# Patient Record
Sex: Female | Born: 1985 | Hispanic: Yes | Marital: Married | Smoking: Current some day smoker
Health system: Southern US, Community
[De-identification: ages and names within clinical notes are randomized; demographics above are authoritative.]

## PROBLEM LIST (undated history)

## (undated) DIAGNOSIS — G4730 Sleep apnea, unspecified: Secondary | ICD-10-CM

## (undated) DIAGNOSIS — J45909 Unspecified asthma, uncomplicated: Secondary | ICD-10-CM

## (undated) HISTORY — DX: Unspecified asthma, uncomplicated: J45.909

## (undated) HISTORY — PX: OTHER SURGICAL HISTORY: SHX169

---
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
## 2007-01-09 HISTORY — PX: CHOLECYSTECTOMY: SHX55

## 2008-07-14 ENCOUNTER — Emergency Department (HOSPITAL_COMMUNITY): Admission: EM | Admit: 2008-07-14 | Discharge: 2008-07-14 | Payer: Self-pay | Admitting: Emergency Medicine

## 2008-07-14 IMAGING — US US OB COMP LESS 14 WK
1 series · 14 of 23 positions shown · non-contrast
Comparison: None

CLINICAL DATA: Abdominal pain.  Vomiting.

OBSTETRIC <14 WK ULTRASOUND
TECHNIQUE: Transabdominal ultrasound was performed for evaluation
of the gestation as well as the maternal uterus and adnexal
regions.

[Series 1: us ob comp less 14 wk · 0.30mm/px · 14 of 23 slices shown]
[im 1/23]
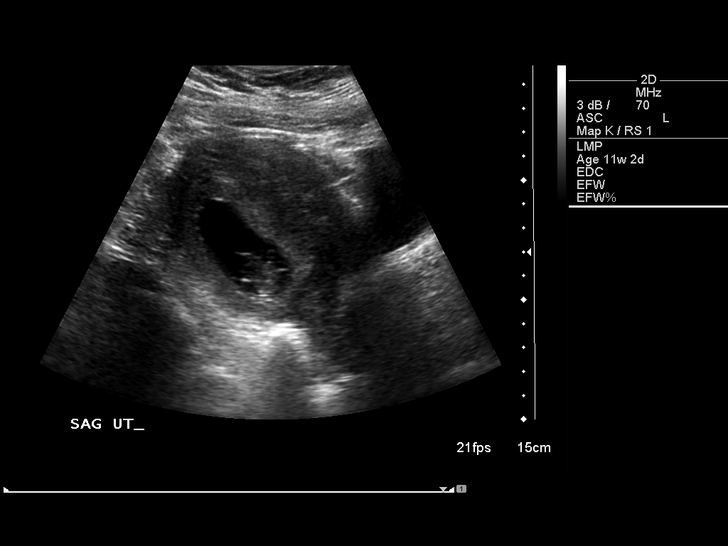
[im 3/23]
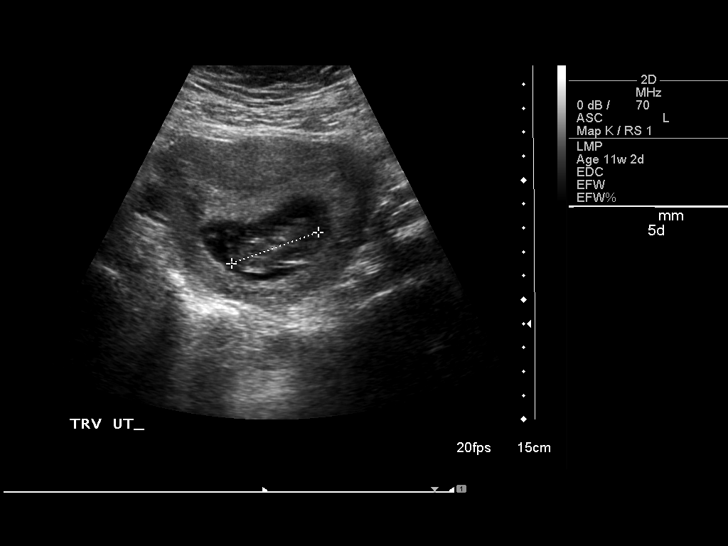
[im 5/23]
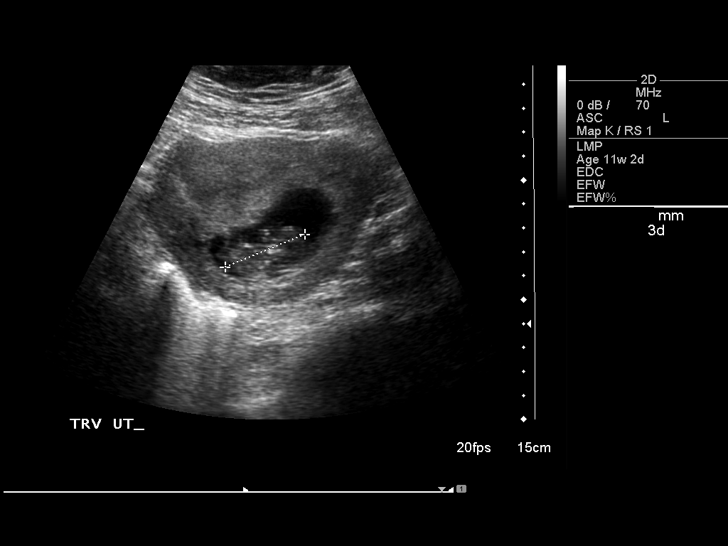
[im 6/23]
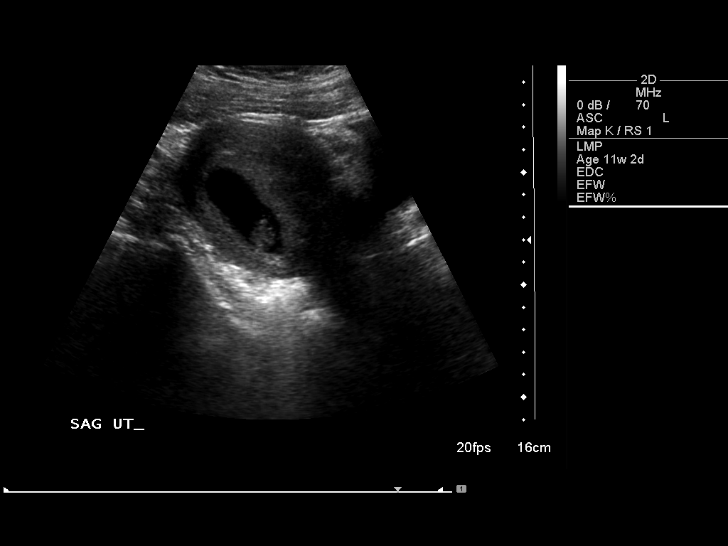
[im 8/23]
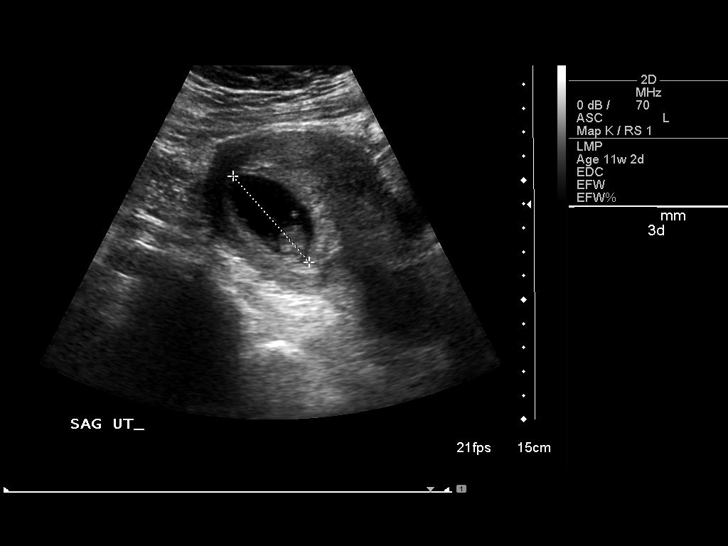
[im 10/23]
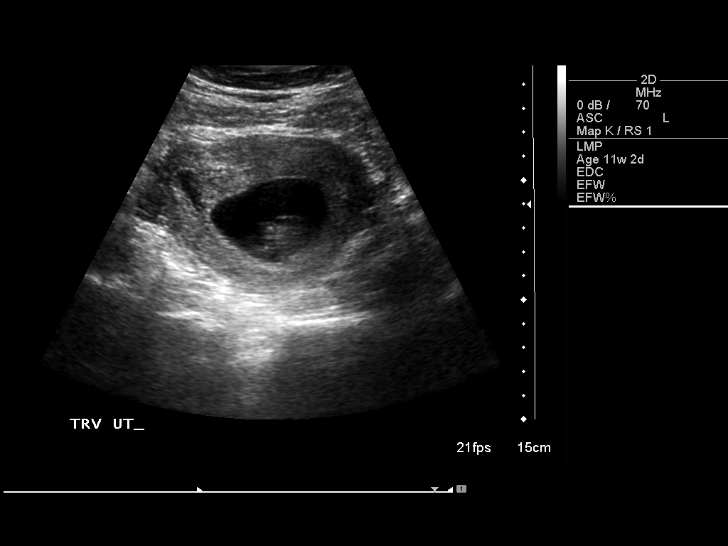
[im 11/23]
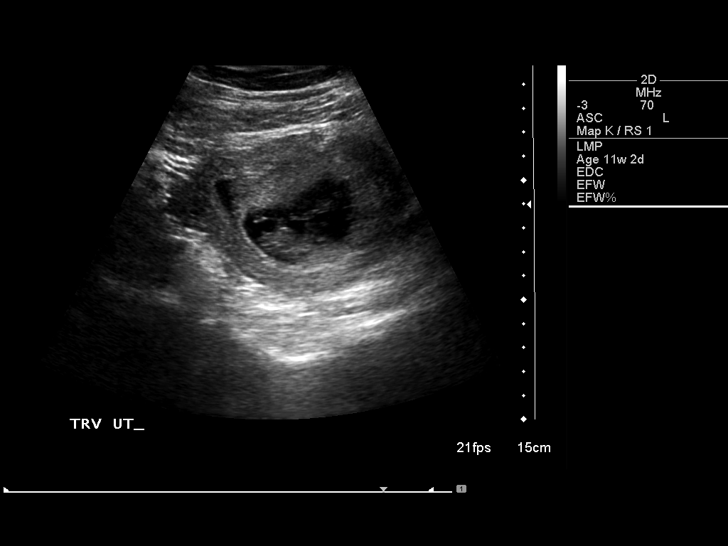
[im 13/23]
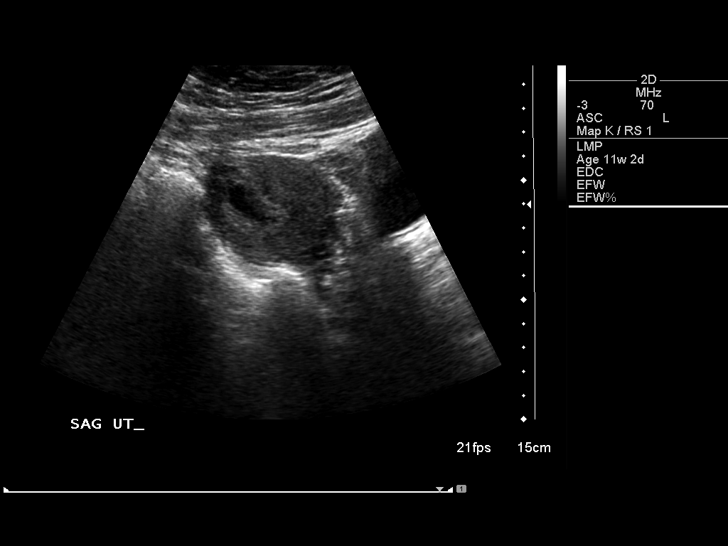
[im 14/23]
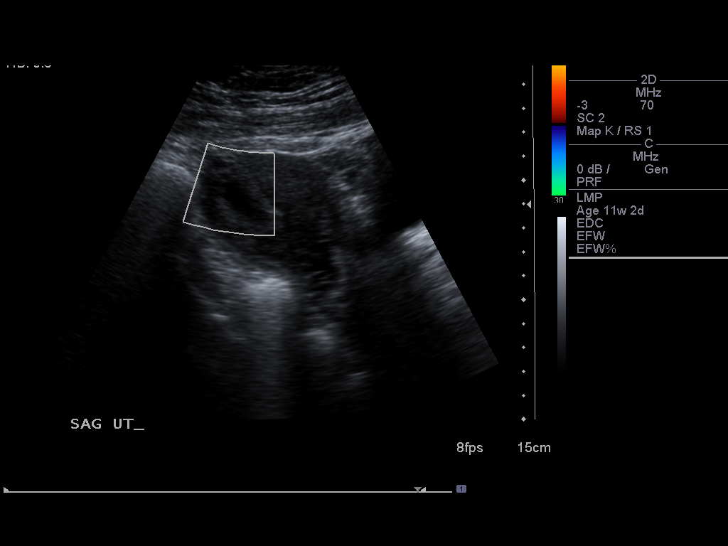
[im 16/23]
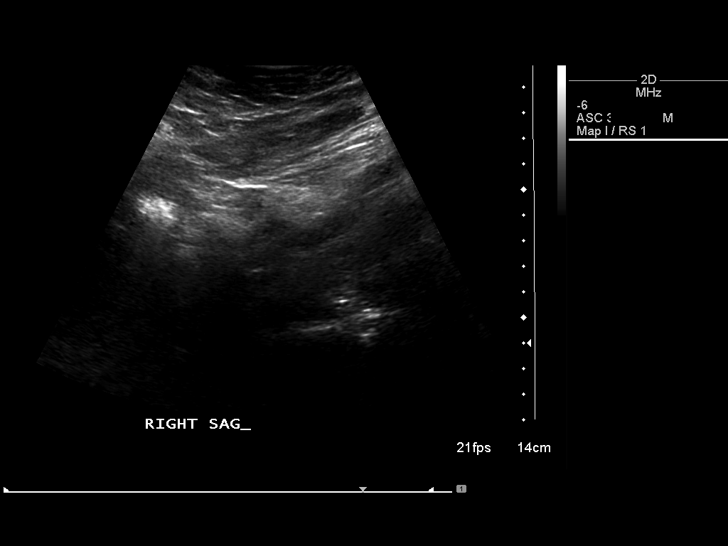
[im 18/23]
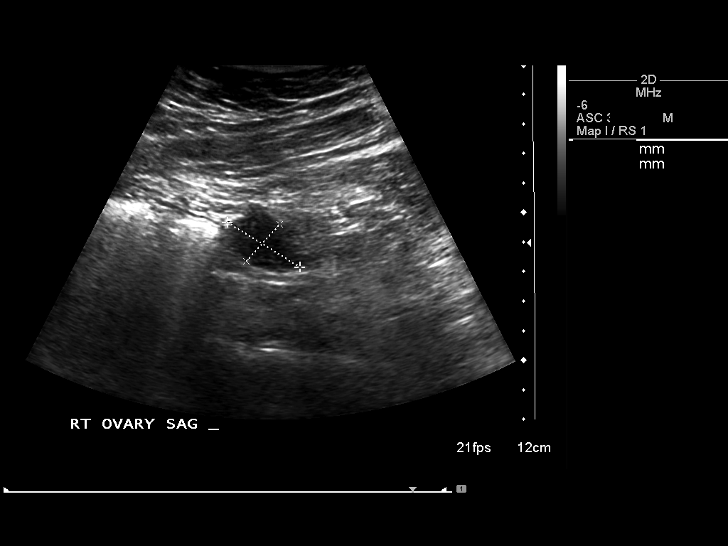
[im 19/23]
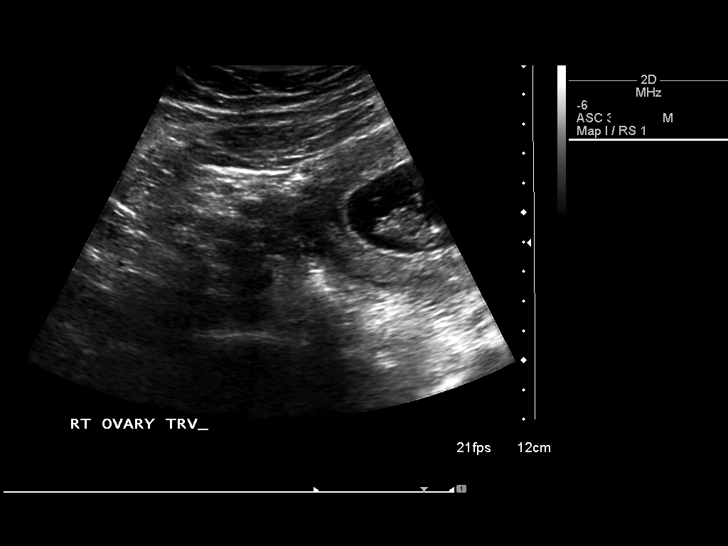
[im 21/23]
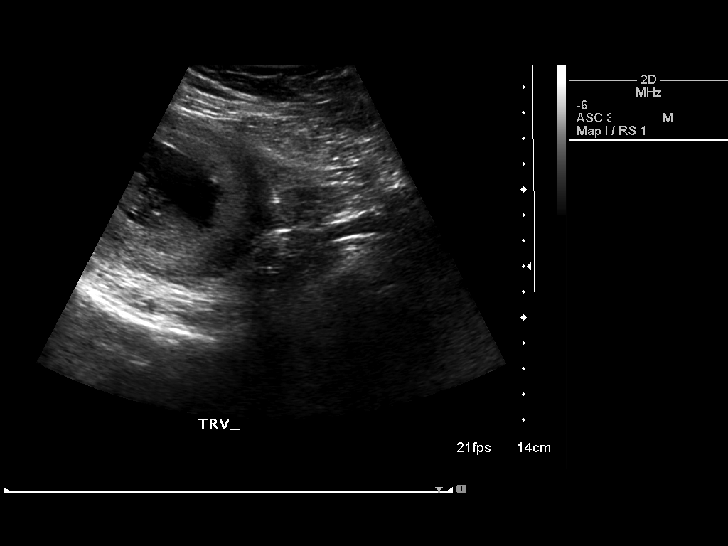
[im 23/23]
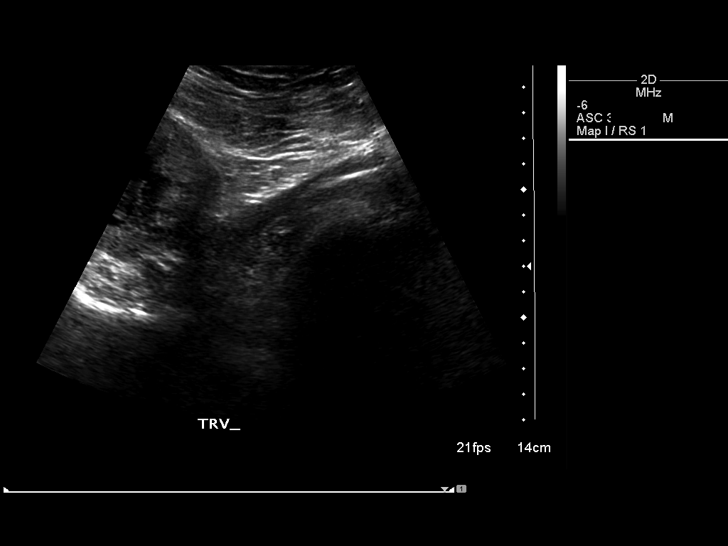

[14 of 23 positions shown; findings below may reference images not displayed]

Intrauterine gestational sac: Single
Yolk sac: None
Embryo: Present
Cardiac Activity: Present
Heart Rate: 163 bpm

MSD: 48.5 mm  10w  4d
CRL:  37.3         10w  4d          US EDC: [DATE]

Maternal uterus/Adnexae:
The right ovary has a normal appearance.  The left ovary is not
visualized. Within the right aspect of the uterus, there is a small
fluid collection which could represent a small subchorionic
hemorrhage.  Alternatively, this could represent a separate
gestational sac lacking an embryonic pole.  Follow-up ultrasound is
suggested.
IMPRESSION: Single living intrauterine embryo corresponding to an age of 10
weeks 4 days.  At the time of anatomy scan, follow-up can be
performed to document presence or absence of a separate fluid
collection within the uterus.

## 2009-01-30 ENCOUNTER — Emergency Department (HOSPITAL_COMMUNITY): Admission: EM | Admit: 2009-01-30 | Discharge: 2009-01-30 | Payer: Self-pay | Admitting: Emergency Medicine

## 2009-01-30 IMAGING — CR DG CHEST 2V
2 series · 2 of 2 positions shown · non-contrast
Comparison: None.

CLINICAL DATA: Cough and fever.

CHEST - 2 VIEW

[view not recorded (1 of 2)]
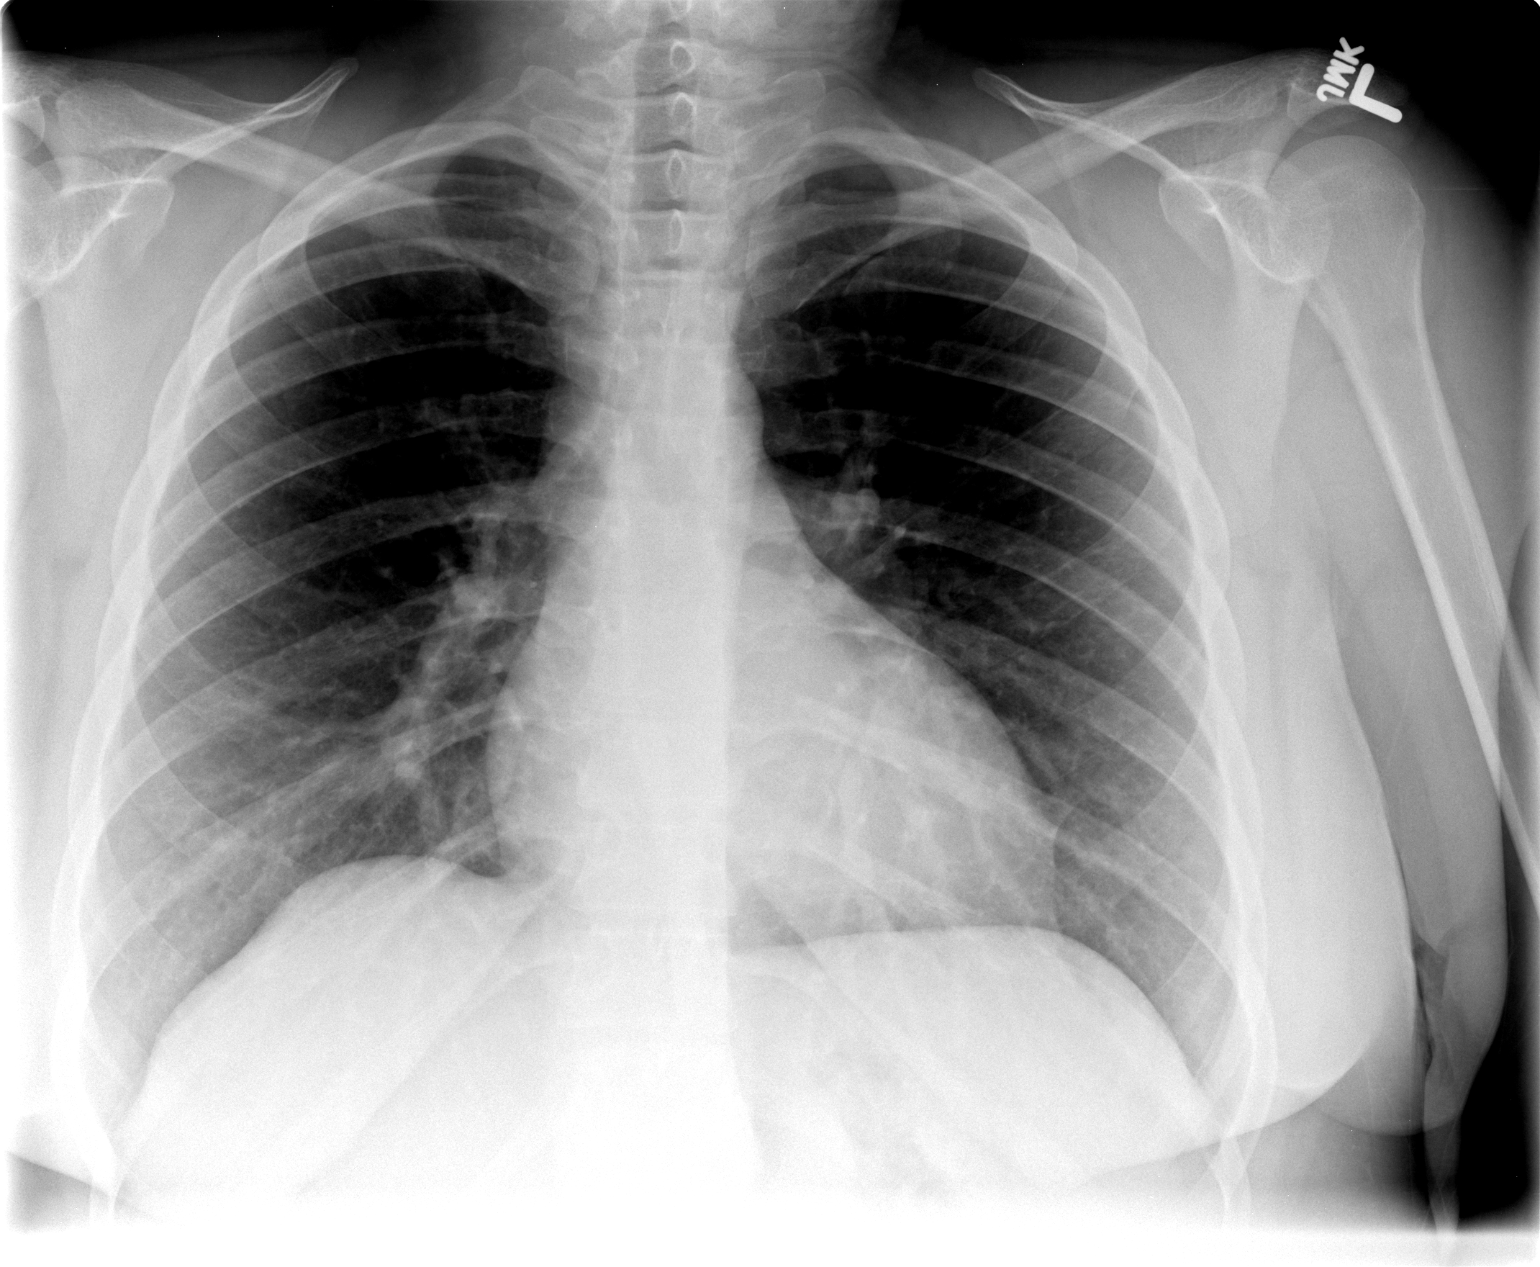

[view not recorded (2 of 2)]
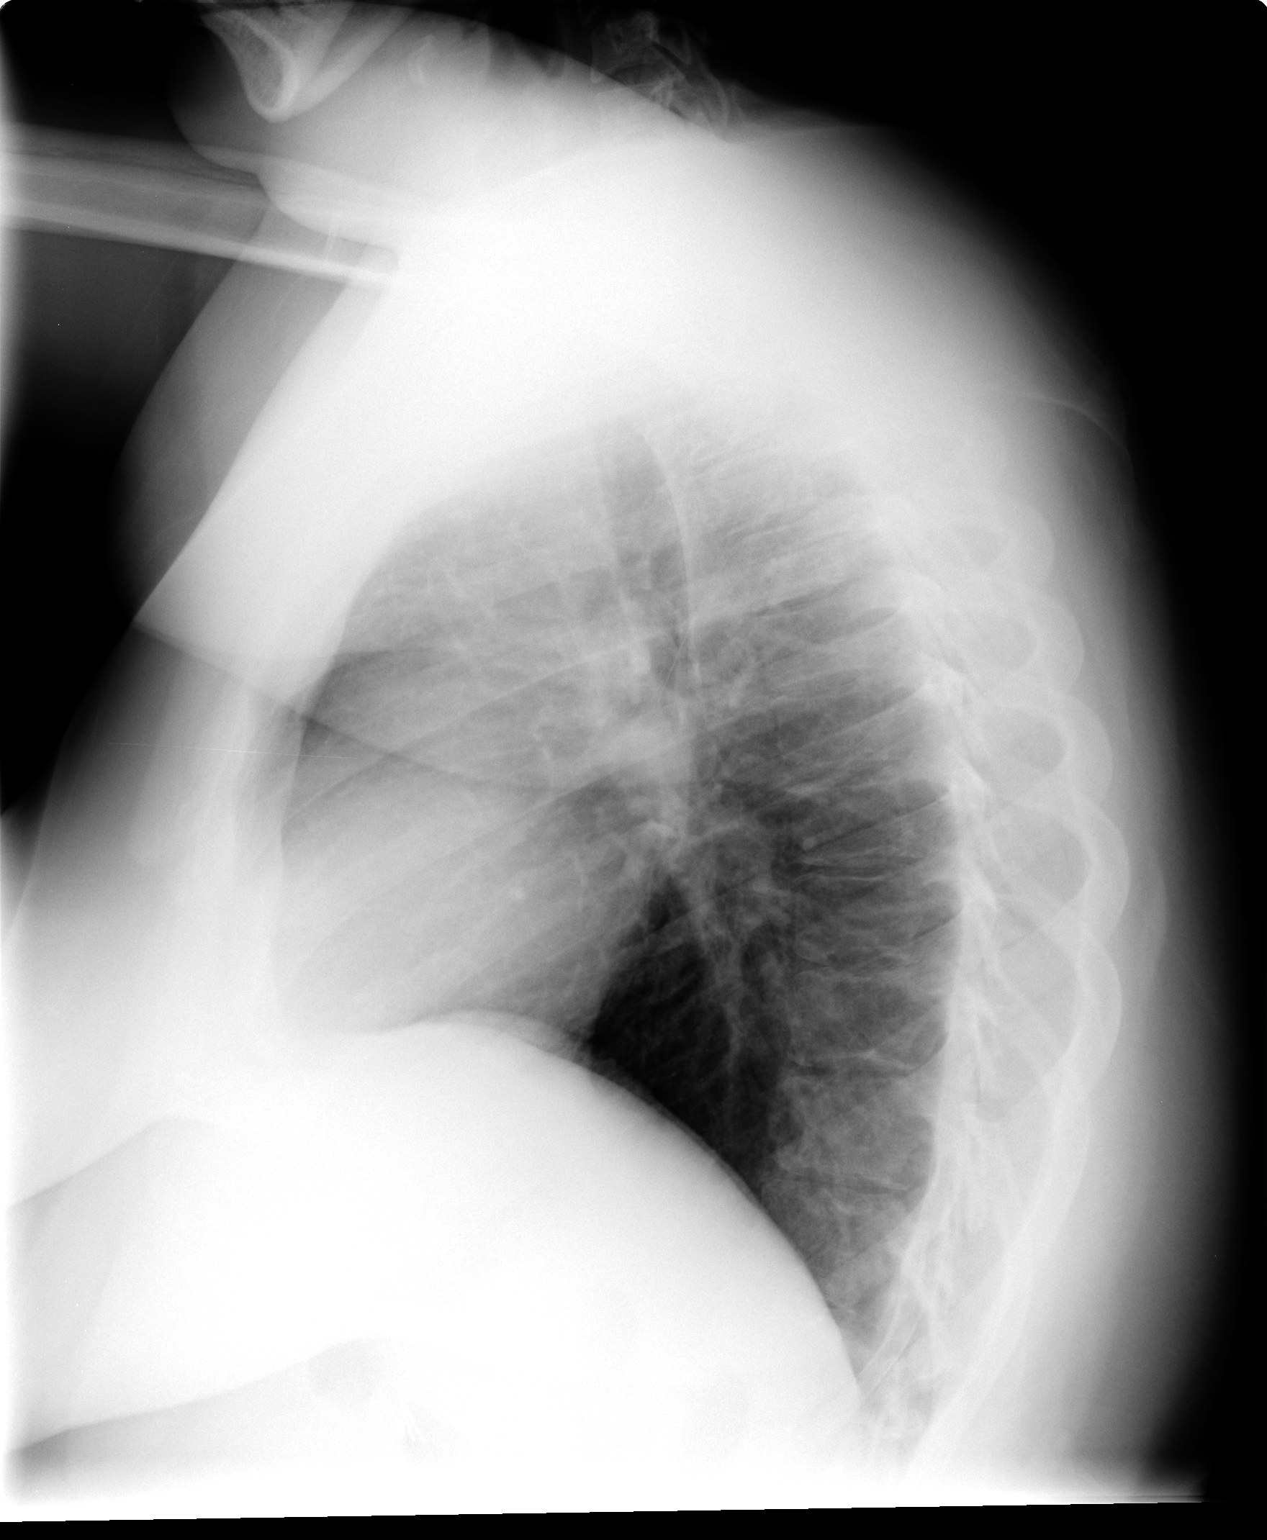

[2 of 2 positions shown; findings below may reference images not displayed]

FINDINGS: The heart size and mediastinal contours are within
normal limits.  Both lungs are clear.  The visualized skeletal
structures are unremarkable.
IMPRESSION: No active cardiopulmonary disease.

## 2010-04-16 LAB — DIFFERENTIAL
Basophils Absolute: 0 10*3/uL (ref 0.0–0.1)
Basophils Relative: 0 % (ref 0–1)
Eosinophils Absolute: 0 10*3/uL (ref 0.0–0.7)
Monocytes Relative: 3 % (ref 3–12)
Neutro Abs: 11 10*3/uL — ABNORMAL HIGH (ref 1.7–7.7)
Neutrophils Relative %: 77 % (ref 43–77)

## 2010-04-16 LAB — COMPREHENSIVE METABOLIC PANEL
ALT: 34 U/L (ref 0–35)
AST: 31 U/L (ref 0–37)
Alkaline Phosphatase: 51 U/L (ref 39–117)
CO2: 22 mEq/L (ref 19–32)
Chloride: 106 mEq/L (ref 96–112)
GFR calc non Af Amer: >60 mL/min (ref >60)
Glucose, Bld: 90 mg/dL (ref 70–99)
Sodium: 136 mEq/L (ref 135–145)
Total Bilirubin: 0.6 mg/dL (ref 0.3–1.2)

## 2010-04-16 LAB — URINALYSIS, ROUTINE W REFLEX MICROSCOPIC
Bilirubin Urine: NEGATIVE
Protein, ur: 100 mg/dL — AB
Specific Gravity, Urine: >1.03 — ABNORMAL HIGH (ref 1.005–1.030)
Urobilinogen, UA: 0.2 mg/dL (ref 0.0–1.0)

## 2010-04-16 LAB — CBC
Hemoglobin: 14.3 g/dL (ref 12.0–15.0)
MCHC: 35.9 g/dL (ref 30.0–36.0)
MCV: 94.7 fL (ref 78.0–100.0)
RBC: 4.21 MIL/uL (ref 3.87–5.11)
RDW: 12.3 % (ref 11.5–15.5)

## 2010-04-16 LAB — LIPASE, BLOOD: Lipase: 12 U/L (ref 11–59)

## 2010-04-16 LAB — URINE CULTURE

## 2010-04-16 LAB — PREGNANCY, URINE: Preg Test, Ur: POSITIVE

## 2010-04-16 LAB — URINE MICROSCOPIC-ADD ON
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
## 2010-05-26 NOTE — H&P (Signed)
[name], BRODER NO.:  [id number]   MEDICAL RECORD NO.:  [id number]                   PATIENT TYPE:  INP   LOCATION:  0102                                 [facility]ACILITY[facility]  [facility]   PHYSICIAN:  Cindie Crumbly, M.D.               DATE OF BIRTH:  11-11-1985   DATE OF ADMISSION:  03/18/2002  DATE OF DISCHARGE:                         PSYCHIATRIC ADMISSION ASSESSMENT   REASON FOR ADMISSION:  This 25 year old white female was admitted  complaining of depression with suicidal ideation with a plan to overdose.   HISTORY OF PRESENT ILLNESS:  The patient was discharged from the [facility] one day prior to this admission.  She went to the [facility] the day after discharge and admitted to suicidal  ideation with a plan.  She refused to contract for safety.  She was thus  readmitted to the hospital for stabilization.  She complains of continually  depressed, irritable and angry mood most of the day, nearly every day,  anhedonia, giving up on activities previously found pleasurable, decreased  school performance, decreased concentration and energy level, increased  symptoms of fatigue, psychomotor agitation, feelings of hopelessness,  helplessness, worthlessness, insomnia, weight gain, recurrent thoughts of  death.  She refuses to contract for safety at this time.   PAST PSYCHIATRIC HISTORY:  Oppositional defiant disorder and major  depression.  Please see the patient's previous psychiatric admission  assessment and discharge summary for the details.   ALCOHOL/DRUG HISTORY:  The patient smokes a pack of cigarettes per day.  She  denies any other use of street drugs or alcohol.   PAST MEDICAL HISTORY:  Obesity.   ALLERGIES:  She has no known drug allergies or sensitivities.   CURRENT MEDICATIONS:  Zoloft 100 mg p.o. q.d.   STRENGTHS/ASSETS:  Her mother is supportive of her.   FAMILY/SOCIAL HISTORY:  The patient lives with  her mother and father.  She  is currently in the ninth grade.  For family and social history, please see  the patient's previous admission.   MENTAL STATUS EXAM:  The patient presents as a well-developed, well-  nourished, adolescent white female who is alert, oriented x 4, psychomotor  agitated and whose appearance is compatible with her stated age.  Her speech  is coherent with a decreased rate and volume of speech, increased speech  latency.  She displays no looseness of associations, phonemic errors or  evidence of a thought disorder.  Her affect and mood are depressed,  irritable and angry.  She displays poor impulse control, decreased  concentration.  Her immediate recall, short-term memory and remote memory  are intact.  Similarities and differences are within normal limits.  She is  able to abstract to simple proverbs.   DIAGNOSES (ACCORDING TO DSM-IV):   AXIS I:  1. Major depression, recurrent, severe without psychosis.  2. Conduct disorder.  3. Rule out oppositional defiant disorder.  4. Nicotine dependence.  5.  Rule out polysubstance dependence.   AXIS II:  1. Rule out personality disorder not otherwise specified.  2. Rule out learning disorder not otherwise specified.   AXIS III:  Obesity.   AXIS IV:  Severe.   AXIS V:  20.   ESTIMATED LENGTH OF STAY:  Five to seven days.   INITIAL DISCHARGE PLAN:  Discharge the patient to home.   INITIAL PLAN OF CARE:  Continue the patient on Zoloft.  Psychotherapy will  focus on improving the patient's impulse control, decreasing cognitive  distortions and potential for self-harm.  A laboratory workup will also be  initiated to rule out any other medical problems contributing to her  symptomatology.                                               Cindie Crumbly, M.D.    TS/MEDQ  D:  03/19/2002  T:  03/19/2002  Job:  [id number]
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
## 2010-05-26 NOTE — Discharge Summary (Signed)
NAMEANOUSHKA, DIVITO NO.:  [id number]   MEDICAL RECORD NO.:  [id number]                   PATIENT TYPE:  INP   LOCATION:  0102                                 FACILITY:  [facility]   PHYSICIAN:  Cindie Crumbly, M.D.               DATE OF BIRTH:  17-Sep-1985   DATE OF ADMISSION:  03/18/2002  DATE OF DISCHARGE:  03/20/2002                                 DISCHARGE SUMMARY   REASON FOR ADMISSION:  This 25 year old white female was admitted  complaining of depression with suicidal ideation with a plan to overdose.  For further history of present illness, please see the patient's psychiatric  admission assessment.   PHYSICAL EXAMINATION:  Her physical examination at the time of admission was  significant for obesity.   LABORATORY DATA:  Her laboratory workup was completed during her last  hospitalization and no additional laboratory workup was obtained during this  hospitalization.  The patient received no x-rays, no special procedures, no  additional consultations.  She sustained no complications during the course  of this hospitalization.   HOSPITAL COURSE:  On admission, the patient was psychomotor agitated.  Affect and mood were depressed, irritable, angry.  She displayed poor  impulse control and decreased concentration.  She was restarted on Zoloft at  100 mg p.o. daily and has tolerated this medication well without side  effects.  Her affect and mood have improved.  At the time of discharge, she  is denying any homicidal or suicidal ideation over the last three days that  she has been hospitalized.  She is actively participating in all aspects of  the therapeutic treatment program, is motivated for outpatient therapy, and  consequently is felt to have reached her maximum benefits of hospitalization  and is ready for discharge to a less restrictive alternative setting.   CONDITION ON DISCHARGE:  Her condition on discharge is improved.   DIAGNOSES:   Diagnoses according to DSM-IV:   AXIS I:  1. Major depression, recurrent, severe without psychosis.  2. Conduct disorder.  3. Rule out oppositional defiant disorder.  4. Nicotine dependence.   AXIS II:  1. Rule out learning disorder, not otherwise specified.  2. Rule out personality disorder, not otherwise specified.   AXIS III:  Obesity.   AXIS IV:  Severe.   AXIS V:  20 on admission, 30 on discharge.   FURTHER EVALUATION AND TREATMENT RECOMMENDATIONS:  1. The patient is discharged to home.  2. She is discharged on an unrestricted level of activity and a regular     diet.  3. She will follow up with outpatient psychiatrist at [facility] for all further aspects of her psychiatric care and     consequently, I will sign off on the case at this time.  4. She is discharged on Zoloft 100 mg p.o. daily.  Cindie Crumbly, M.D.    TS/MEDQ  D:  03/20/2002  T:  03/20/2002  Job:  [id number]
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
## 2010-05-26 NOTE — Discharge Summary (Signed)
NAMEZACHARY, [name] NO.:  [id number]   MEDICAL RECORD NO.:  [id number]                   PATIENT TYPE:  INP   LOCATION:  0105                                 FACILITY:  [facility]   PHYSICIAN:  Cindie Crumbly, M.D.               DATE OF BIRTH:  12/28/1985   DATE OF ADMISSION:  03/11/2002  DATE OF DISCHARGE:  03/17/2002                                 DISCHARGE SUMMARY   REASON FOR ADMISSION:  This 25 year old white female was admitted  complaining of depression status post overdose with aspirin and Tylenol as a  suicide attempt.  For further history of present illness, please see the  patient's psychiatric admission assessment.   PHYSICAL EXAMINATION:  At the time of admission was significant for obesity,  old and new self-inflicted lacerations to the left forearm. She had an  otherwise unremarkable physical examination.   LABORATORY DATA:  Free T4 was 0.85, TSH was 0.500.  Urine probe for  gonorrhea and chlamydia are pending at the time of discharge and RPR was  nonreactive.  GGT was within normal limits.  All further laboratory  evaluations were performed at [facility] prior to the  patient being transferred to this facility.  The patient received no x-rays,  no special procedures, no additional consultations.  He sustained no  complications during the course of this hospitalization.   HOSPITAL COURSE:  On admission, the patient was psychomotor agitated.  Affect and mood were depressed, irritable, anxious and angry.  She displayed  poor impulse control, decreased concentration and poor hygiene.  She was  begun on a trial of Zoloft and titrated up to a therapeutic dose.  At the  time of discharge, she denies any suicidal or homicidal ideation.  Her  affect and mood have improved.  Her hygiene is improved.  She is performing  all of her activities of daily living.  She displayed no evidence of a  thought disorder throughout her  hospital course.  She is actively  participating in all aspects of the therapeutic treatment program, is  tolerating Zoloft without side effects.  She is motivated for outpatient  therapy and, consequently, is felt to have reached her maximum benefits of  hospitalization and is ready for discharge to a less restrictive alternative  setting.   CONDITION ON DISCHARGE:  Improved.   DIAGNOSES (ACCORDING TO DSM-IV):   AXIS I:  1. Major depression, recurrent, severe without psychosis.  2. Conduct disorder.  3. Nicotine dependence.  4. Alcohol abuse.  5. Rule out alcohol dependence.  6. Rule out polysubstance dependence.   AXIS II:  1. Rule out personality disorder not otherwise specified.  2. Rule out learning disorder not otherwise specified.   AXIS III:  Obesity.   AXIS IV:  Severe.   AXIS V:  20 on admission; 30 on discharge.   FURTHER EVALUATION AND TREATMENT RECOMMENDATIONS:  1. The patient is discharged to home.  2.  She is discharged on an unrestricted level of activity and a regular     diet.  3. She is discharged on Zoloft 100 mg p.o. q.d.  4. She will follow up with her outpatient psychiatrist for all further     aspects of her psychiatric care and with her primary care physician for     all further aspects of her medical care.                                               Cindie Crumbly, M.D.    TS/MEDQ  D:  03/17/2002  T:  03/17/2002  Job:  [id number]

## 2011-07-24 ENCOUNTER — Encounter (HOSPITAL_COMMUNITY): Payer: Self-pay

## 2011-07-24 ENCOUNTER — Emergency Department (HOSPITAL_COMMUNITY)
Admission: EM | Admit: 2011-07-24 | Discharge: 2011-07-24 | Disposition: A | Discharge status: Home / Self Care | Payer: Self-pay | Attending: Emergency Medicine | Admitting: Emergency Medicine

## 2011-07-24 DIAGNOSIS — Y9389 Activity, other specified: Secondary | ICD-10-CM | POA: Insufficient documentation

## 2011-07-24 DIAGNOSIS — F17200 Nicotine dependence, unspecified, uncomplicated: Secondary | ICD-10-CM | POA: Insufficient documentation

## 2011-07-24 DIAGNOSIS — Y998 Other external cause status: Secondary | ICD-10-CM | POA: Insufficient documentation

## 2011-07-24 DIAGNOSIS — W268XXA Contact with other sharp object(s), not elsewhere classified, initial encounter: Secondary | ICD-10-CM | POA: Insufficient documentation

## 2011-07-24 DIAGNOSIS — S61219A Laceration without foreign body of unspecified finger without damage to nail, initial encounter: Secondary | ICD-10-CM

## 2011-07-24 DIAGNOSIS — S61209A Unspecified open wound of unspecified finger without damage to nail, initial encounter: Secondary | ICD-10-CM | POA: Insufficient documentation

## 2011-07-24 MED ORDER — ACETAMINOPHEN 325 MG PO TABS
650.0000 mg | ORAL_TABLET | Freq: Once | ORAL | Status: AC
  Administered 2011-07-24: 650 mg via ORAL
  Filled 2011-07-24: qty 2

## 2011-07-24 MED ORDER — TETANUS-DIPHTH-ACELL PERTUSSIS 5-2.5-18.5 LF-MCG/0.5 IM SUSP
0.5000 mL | Freq: Once | INTRAMUSCULAR | Status: AC
  Administered 2011-07-24: 0.5 mL via INTRAMUSCULAR
  Filled 2011-07-24: qty 0.5

## 2011-07-24 MED ORDER — IBUPROFEN 800 MG PO TABS
800.0000 mg | ORAL_TABLET | Freq: Once | ORAL | Status: AC
  Administered 2011-07-24: 800 mg via ORAL
  Filled 2011-07-24: qty 1

## 2011-07-24 NOTE — ED Notes (Signed)
Pt cut left thumb while trying to open a can.  Bleeding controlled.  Unknown when last tetanus shot was.

## 2011-07-24 NOTE — ED Provider Notes (Signed)
Medical screening examination/treatment/procedure(s) were performed by non-physician practitioner and as supervising physician I was immediately available for consultation/collaboration.   Shaquita Fort L Rowyn Mustapha, MD 07/24/11 1515 

## 2011-07-24 NOTE — ED Provider Notes (Addendum)
History     CSN: [id number]  Arrival date & time 07/24/11  7425   First MD Initiated Contact with Patient 07/24/11 1114      Chief Complaint  Patient presents with  . Laceration    (Consider location/radiation/quality/duration/timing/severity/associated sxs/prior treatment) HPI Comments: Patient is a 26 year old female who cut her left thumb while opening a can today. The patient was able to control the bleeding with applying pressure. She states that she did not sustain injury to any other areas. She is unsure of the date of her last tetanus. She denies use of any blood thinning type medications and denies any bleeding disorders.  Patient is a 26 y.o. female presenting with skin laceration. The history is provided by the patient.  Laceration     History reviewed. No pertinent past medical history.  Past Surgical History  Procedure Date  . Colon surgery   . Left arm surgery     No family history on file.  History  Substance Use Topics  . Smoking status: Current Everyday Smoker  . Smokeless tobacco: Not on file  . Alcohol Use: No    OB History    Grav Para Term Preterm Abortions TAB SAB Ect Mult Living                  Review of Systems  Constitutional: Negative for activity change.       All ROS Neg except as noted in HPI  HENT: Negative for nosebleeds and neck pain.   Eyes: Negative for photophobia and discharge.  Respiratory: Negative for cough, shortness of breath and wheezing.   Cardiovascular: Negative for chest pain and palpitations.  Gastrointestinal: Negative for abdominal pain and blood in stool.  Genitourinary: Negative for dysuria, frequency and hematuria.  Musculoskeletal: Negative for back pain and arthralgias.  Skin: Negative.   Neurological: Negative for dizziness, seizures and speech difficulty.  Psychiatric/Behavioral: Negative for hallucinations and confusion.    Allergies  Aspirin  Home Medications  No current outpatient prescriptions  on file.  BP 140/91  Pulse 91  Temp 98.7 F (37.1 C) (Oral)  Resp 20  Ht 5\' 6"  (1.676 m)  Wt 225 lb (102.059 kg)  BMI 36.32 kg/m2  SpO2 99%  LMP 07/14/2011  Physical Exam  Nursing note and vitals reviewed. Constitutional: She is oriented to person, place, and time. She appears well-developed and well-nourished.  Non-toxic appearance.  HENT:  Head: Normocephalic.  Right Ear: Tympanic membrane and external ear normal.  Left Ear: Tympanic membrane and external ear normal.  Eyes: EOM and lids are normal. Pupils are equal, round, and reactive to light.  Neck: Normal range of motion. Neck supple. Carotid bruit is not present.  Cardiovascular: Normal rate, regular rhythm, normal heart sounds, intact distal pulses and normal pulses.   Pulmonary/Chest: Breath sounds normal. No respiratory distress.  Abdominal: Soft. Bowel sounds are normal. There is no tenderness. There is no guarding.  Musculoskeletal: Normal range of motion.       Laceration to the palmar surface of the left thumb. No bone or tendon involvement.  Lymphadenopathy:       Head (right side): No submandibular adenopathy present.       Head (left side): No submandibular adenopathy present.    She has no cervical adenopathy.  Neurological: She is alert and oriented to person, place, and time. She has normal strength. No cranial nerve deficit or sensory deficit.  Skin: Skin is warm and dry.  Psychiatric: She has a normal  mood and affect. Her speech is normal.    ED Course  Procedures (including critical care time)LACERATION REPAIR - the patient was identified with arm band. Permission for procedure given by the patient. Procedural time out taken before repair of the laceration to the left thumb. The thumb injury was irrigated with tap water. It was cleansed with safe cleanse. The wound was inspected. There was no foreign body appreciated, no bone or tendon involvement. The wound edges were brought together with Steri-Strips.  And the wound was repaired with Dermabond. The wound measures 2.6cm.  The patient tolerated the procedure without problem or complication. The patient's tetanus status was updated.  Labs Reviewed - No data to display No results found.   1. Laceration of finger, left       MDM  I have reviewed nursing notes, vital signs, and all appropriate lab and imaging results for this patient. Patient sustained a laceration to the left thumb while opening a can. This wound was repaired with Dermabond. Patient advised to return if any changes or signs of infection.       Kathie Dike, PA 07/24/11 1233  Kathie Dike, [place] 08/25/11 [phone]

## 2011-08-25 NOTE — ED Provider Notes (Signed)
Medical screening examination/treatment/procedure(s) were performed by non-physician practitioner and as supervising physician I was immediately available for consultation/collaboration.   Benny Lennert, MD 08/25/11 1504

## 2012-08-19 ENCOUNTER — Ambulatory Visit (INDEPENDENT_AMBULATORY_CARE_PROVIDER_SITE_OTHER): Payer: Medicaid Other | Admitting: Family Medicine

## 2012-08-19 ENCOUNTER — Encounter: Payer: Self-pay | Admitting: Family Medicine

## 2012-08-19 VITALS — BP 131/77 | HR 67 | Temp 99.3°F | Wt 256.2 lb

## 2012-08-19 DIAGNOSIS — G4710 Hypersomnia, unspecified: Secondary | ICD-10-CM

## 2012-08-19 DIAGNOSIS — R0989 Other specified symptoms and signs involving the circulatory and respiratory systems: Secondary | ICD-10-CM

## 2012-08-19 DIAGNOSIS — R0609 Other forms of dyspnea: Secondary | ICD-10-CM

## 2012-08-19 DIAGNOSIS — I809 Phlebitis and thrombophlebitis of unspecified site: Secondary | ICD-10-CM

## 2012-08-19 DIAGNOSIS — R0683 Snoring: Secondary | ICD-10-CM

## 2012-08-19 DIAGNOSIS — R635 Abnormal weight gain: Secondary | ICD-10-CM

## 2012-08-19 LAB — POCT CBC
Granulocyte percent: 64.7 %G (ref 37–80)
HCT, POC: 42.4 % (ref 37.7–47.9)
Hemoglobin: 14.3 g/dL (ref 12.2–16.2)
Lymph, poc: 2.9 (ref 0.6–3.4)
MCH, POC: 31.3 pg — AB (ref 27–31.2)
MCHC: 33.8 g/dL (ref 31.8–35.4)
MCV: 92.6 fL (ref 80–97)
MPV: 8.6 fL (ref 0–99.8)
POC Granulocyte: 5.7 (ref 2–6.9)
POC LYMPH PERCENT: 33.3 %L (ref 10–50)
Platelet Count, POC: 283 10*3/uL (ref 142–424)
RBC: 4.6 M/uL (ref 4.04–5.48)
RDW, POC: 14 %
WBC: 8.8 10*3/uL (ref 4.6–10.2)

## 2012-08-19 MED ORDER — NAPROXEN 500 MG PO TABS: 500.0000 mg | ORAL_TABLET | Freq: Two times a day (BID) | ORAL | Status: DC

## 2012-08-19 NOTE — Patient Instructions (Addendum)
Phlebitis  Phlebitis is a redness, tenderness and soreness (inflammation) in a vein. This can occur in your arms, legs, or torso (trunk), as well as deeper inside your body.   CAUSES   Phlebitis can be triggered by multiple factors. These include:   Reduced (restricted) blood flow through your veins. This happens with prolonged bed rest, long distance travel, injury or surgery. Being overweight (obese) and pregnant can also restrict blood flow and lead to phlebitis.   Putting a catheter in the vein (intravenous or IV) and giving certain medications through in the vein (intravenously).   Cancer and cancer treatment.   Use of illegal intravenous drugs.   Inflammatory diseases.   Inherited (genetic) diseases that increase the risk for blood clots.   Hormone therapy (such as birth control pills).  SYMPTOMS    Red, tender, swollen, painful area on your skin.   Usually, the area will be long and narrow.   Low grade fever.   Significant firmness along the center of this area. This can indicate that a blood clot has formed.   Surrounding redness or a high fever, which can indicate an infection (cellulitis).  DIAGNOSIS    The appearance of your condition and your symptoms will cause your caregiver to suspect phlebitis. Usually, this is enough for a diagnosis.   Your caregiver may request blood tests or an ultrasound test of the area to be sure you do not have an infection or a blood clot. Blood tests and discussing your family history may also indicate if you have an underlying genetic disease that causes blood clots.   Occasionally, a piece of tissue is taken from the body (biopsy) if an unusual cause of phlebitis is suspected.  TREATMENT    Raise (elevate) the affected area above the level of the heart.   Apply a warm compress or heating pad for 20 minutes, 3 or 4 times a day. If you use an electric heating pad, follow the directions so you do not burn yourself.   Anti-inflammatory medications are usually  recommended. Follow your caregiver's directions.   Any IV catheter, if present, will be removed by your caregiver.   Your caregiver may prescribe medicines that kill germs (antibiotics) if an infection is present.   Your caregiver may recommend blood thinners if a blood clot is suspected or present.   Support stockings or bandages may be helpful, depending on the cause and location of the phlebitis.   Surgery may be needed to remove very damaged sections of vein, but this is rare.  HOME CARE INSTRUCTIONS    Take medications exactly as prescribed.   Follow up with your caregiver as directed.   Use support stockings or bandages if advised. These will speed healing and prevent recurrence.   If you are on blood thinners:   Do follow-up blood tests exactly as directed.   Check with your caregiver before using any new medications.   Wear a pendant to show that you are on blood thinners.   For phlebitis in the legs:   Avoid prolonged standing or bed rest.   Keep your legs moving. Raise your legs with sitting or lying.   Do not smoke.   Women, particularly those over the age of 35, should consider the risks and benefits of taking the contraceptive pill. This kind of hormone treatment can increase your risk for blood clots.  SEEK MEDICAL CARE IF:    You have unusual bruising or any bleeding problems.   Swelling   or pain in your affected arm or leg is not gradually improving.   You are on anti-inflammatory medication and you develop belly (abdominal) pain.  SEEK IMMEDIATE MEDICAL CARE IF:    An unexplained oral temperature above 100.5 F (38.1 C) develops.   You have sudden onset of chest pain or difficulty breathing.  Document Released: 12/19/2000 Document Revised: 03/19/2011 Document Reviewed: 09/20/2008  [employer] Patient Information 2014 [employer]

## 2012-08-19 NOTE — Progress Notes (Signed)
  Subjective:    Patient ID: [name], female    DOB: 1985/04/29, 27 y.o.   MRN: [id number]  HPI This 27 y.o. female presents for evaluation of  Shortness of breath for 3 weeks. She has been having some shortness of breath and coughing at night.  She Has been having some wheezing.  She denies URI sx's.  She has bruising on her Left leg.  She also c/o severe snoring and insomnia problems.  She gets a headache Daily and she is chronically tired.   She admits to hypersomnolence.   Review of Systems C/o bruising and discomfort in her right leg, snoring, hypersomnolence, shortness of breath, night time coughing,  No chest pain,  dizziness, vision change, N/V, diarrhea, constipation, dysuria, urinary urgency or frequency, myalgias, arthralgias or rash.     Objective:   Physical Exam Vital signs noted  Well developed well nourished female.  HEENT - Head atraumatic Normocephalic                Eyes - PERRLA, Conjuctiva - clear Sclera- Clear EOMI                Ears - EAC's Wnl TM's Wnl Gross Hearing WNL                Nose - Nares patent                 Throat - oropharanx wnl Respiratory - Lungs CTA bilateral Cardiac - RRR S1 and S2 without murmur GI - Abdomen soft Nontender and bowel sounds active x 4 Extremities - No edema. Neuro - Grossly intact. Skin - Tender varicose vein right leg.  Negative Homan's      Assessment & Plan:  Snoring - Plan: Split night study  Hypersomnolence - Plan: Split night study, POCT CBC, CMP14+EGFR, TSH, Lipid panel  Morbid obesity - Discussed patient needing to use aggressive weight loss measures.  Thrombophlebitis - Plan: naproxen (NAPROSYN) 500 MG   Shortness of breath - Symbicort 160/4.5 2 puffs bid.  Follow up in 2 weeks.

## 2012-08-20 LAB — CMP14+EGFR
ALT: 39 IU/L — ABNORMAL HIGH (ref 0–32)
AST: 25 IU/L (ref 0–40)
Albumin/Globulin Ratio: 2 (ref 1.1–2.5)
Albumin: 4.8 g/dL (ref 3.5–5.5)
Alkaline Phosphatase: 71 IU/L (ref 39–117)
BUN/Creatinine Ratio: 16 (ref 8–20)
BUN: 11 mg/dL (ref 6–20)
CO2: 20 mmol/L (ref 18–29)
Calcium: 9.9 mg/dL (ref 8.7–10.2)
Chloride: 102 mmol/L (ref 97–108)
Creatinine, Ser: 0.69 mg/dL (ref 0.57–1.00)
GFR calc Af Amer: 140 mL/min/{1.73_m2} (ref >59)
GFR calc non Af Amer: 121 mL/min/{1.73_m2} (ref >59)
Globulin, Total: 2.4 g/dL (ref 1.5–4.5)
Glucose: 97 mg/dL (ref 65–99)
Potassium: 4.2 mmol/L (ref 3.5–5.2)
Sodium: 139 mmol/L (ref 134–144)
Total Bilirubin: 0.3 mg/dL (ref 0.0–1.2)
Total Protein: 7.2 g/dL (ref 6.0–8.5)

## 2012-08-20 LAB — LIPID PANEL
Chol/HDL Ratio: 5.4 ratio units — ABNORMAL HIGH (ref 0.0–4.4)
Cholesterol, Total: 225 mg/dL — ABNORMAL HIGH (ref 100–199)
HDL: 42 mg/dL (ref >39)
LDL Calculated: 136 mg/dL — ABNORMAL HIGH (ref 0–99)
Triglycerides: 234 mg/dL — ABNORMAL HIGH (ref 0–149)
VLDL Cholesterol Cal: 47 mg/dL — ABNORMAL HIGH (ref 5–40)

## 2012-08-20 LAB — TSH: TSH: 0.886 u[IU]/mL (ref 0.450–4.500)

## 2012-09-02 ENCOUNTER — Ambulatory Visit: Payer: Medicaid Other | Admitting: Family Medicine

## 2012-09-07 ENCOUNTER — Ambulatory Visit: Payer: Medicaid Other | Attending: Family Medicine | Admitting: Sleep Medicine

## 2012-09-07 DIAGNOSIS — R0683 Snoring: Secondary | ICD-10-CM

## 2012-09-07 DIAGNOSIS — G4733 Obstructive sleep apnea (adult) (pediatric): Secondary | ICD-10-CM | POA: Insufficient documentation

## 2012-09-07 DIAGNOSIS — G4710 Hypersomnia, unspecified: Secondary | ICD-10-CM

## 2012-09-10 ENCOUNTER — Ambulatory Visit (INDEPENDENT_AMBULATORY_CARE_PROVIDER_SITE_OTHER): Payer: Medicaid Other | Admitting: Family Medicine

## 2012-09-12 ENCOUNTER — Ambulatory Visit (INDEPENDENT_AMBULATORY_CARE_PROVIDER_SITE_OTHER): Payer: Medicaid Other | Admitting: Family Medicine

## 2012-09-12 ENCOUNTER — Encounter: Payer: Self-pay | Admitting: Family Medicine

## 2012-09-12 VITALS — BP 133/82 | HR 67 | Temp 99.7°F | Ht 66.0 in | Wt 253.0 lb

## 2012-09-12 DIAGNOSIS — J45909 Unspecified asthma, uncomplicated: Secondary | ICD-10-CM

## 2012-09-12 MED ORDER — BUDESONIDE-FORMOTEROL FUMARATE 80-4.5 MCG/ACT IN AERO: 2.0000 | INHALATION_SPRAY | Freq: Two times a day (BID) | RESPIRATORY_TRACT | Status: DC

## 2012-09-12 MED ORDER — ALBUTEROL SULFATE HFA 108 (90 BASE) MCG/ACT IN AERS: 2.0000 | INHALATION_SPRAY | Freq: Four times a day (QID) | RESPIRATORY_TRACT | Status: DC | PRN

## 2012-09-12 NOTE — Progress Notes (Signed)
  Subjective:    Patient ID: [name], female    DOB: 1985/12/28, 27 y.o.   MRN: [id number]  HPI This 27 y.o. female presents for evaluation of shortness of breath.  She has responded Well to the symbicort and is not coughing anymore or having night time wheezing.  She has done A sleep study and she states she was told she has apnea and would be scheduled another test. Her sleep study is still pending. She has had labs and her trigs were elevated due non fasting state And her LDL was mildly elevated.  She is otherwise feeling fine.   Review of Systems No chest pain, SOB, HA, dizziness, vision change, N/V, diarrhea, constipation, dysuria, urinary urgency or frequency, myalgias, arthralgias or rash.     Objective:   Physical Exam Vital signs noted  Well developed well nourished female.  HEENT - Head atraumatic Normocephalic                Eyes - PERRLA, Conjuctiva - clear Sclera- Clear EOMI                Ears - EAC's Wnl TM's Wnl Gross Hearing WNL                Nose - Nares patent                 Throat - oropharanx wnl Respiratory - Lungs CTA bilateral Cardiac - RRR S1 and S2 without murmur GI - Abdomen soft Nontender and bowel sounds active x 4 Extremities - No edema. Neuro - Grossly intact.       Assessment & Plan:  Unspecified asthma(493.90) - Plan: budesonide-formoterol (SYMBICORT) 80-4.5 MCG/ACT inhaler, albuterol (PROVENTIL HFA;VENTOLIN HFA) 108 (90 BASE) MCG/ACT inhaler  Snoring - Await sleep study results.  Follow up in 3 months.

## 2012-09-12 NOTE — Patient Instructions (Signed)

## 2012-09-13 NOTE — Procedures (Signed)
[name], [name]       ACCOUNT NO.:  [id number]  MEDICAL RECORD NO.:  [id number]          PATIENT TYPE:  OUT  LOCATION:  SLEEP LAB                     FACILITY:  [facility]  PHYSICIAN:  Valentine Barney A. Gerilyn Pilgrim, M.D. DATE OF BIRTH:  May 29, 1985  DATE OF STUDY:  09/07/2012                           NOCTURNAL POLYSOMNOGRAM   REFERRING PHYSICIAN:  Nils Pyle, FNP  INDICATION:  A 27 year old, who presents with obesity, hypersomnia, and snoring.   MEDICATIONS:  Naproxen.  EPWORTH SLEEPINESS SCALE:  14.  BMI:  41.  ARCHITECTURAL SUMMARY:  The total recording time is 401 minutes.  Sleep efficiency 92%.  Sleep latency 16 minutes.  REM latency 81 minutes. Stage N1 5%, N2 46%, N3 30%, and REM sleep 18%.  RESPIRATORY SUMMARY:  Baseline oxygen saturation is 97, lowest saturation 89 during non-REM sleep.  Diagnostic AHI 16 and RDI 21.  LIMB MOVEMENT SUMMARY:  PLM index 0.  ELECTROCARDIOGRAM SUMMARY:  Average heart rate is 56 with no significant dysrhythmias observed.  IMPRESSION:  Moderate obstructive sleep apnea syndrome.  RECOMMENDATION:  Formal CPAP titration.  Thanks for this referral.     Lovell Roe A. Gerilyn Pilgrim, M.D.    KAD/MEDQ  D:  09/13/2012 16:15:31  T:  09/13/2012 16:25:18  Job:  [id number]

## 2012-09-18 ENCOUNTER — Other Ambulatory Visit: Payer: Self-pay | Admitting: Family Medicine

## 2012-09-18 DIAGNOSIS — G4733 Obstructive sleep apnea (adult) (pediatric): Secondary | ICD-10-CM

## 2012-09-29 ENCOUNTER — Other Ambulatory Visit: Payer: Self-pay | Admitting: Family Medicine

## 2012-09-29 DIAGNOSIS — G4730 Sleep apnea, unspecified: Secondary | ICD-10-CM

## 2012-11-13 ENCOUNTER — Other Ambulatory Visit: Payer: Self-pay

## 2012-11-14 ENCOUNTER — Ambulatory Visit: Payer: Medicaid Other | Attending: Family Medicine | Admitting: Sleep Medicine

## 2012-11-14 VITALS — Ht 66.0 in | Wt 250.0 lb

## 2012-11-14 DIAGNOSIS — G4733 Obstructive sleep apnea (adult) (pediatric): Secondary | ICD-10-CM

## 2012-11-22 NOTE — Procedures (Signed)
[facility] Rozann Holts A. Gerilyn Pilgrim, MD     [email or web address]         NAMEGRACELYNNE, BENEDICT       ACCOUNT NO.:  [id number]  MEDICAL RECORD NO.:  [id number]          PATIENT TYPE:  OUT  LOCATION:  SLEEP LAB                     FACILITY:  [facility]  PHYSICIAN:  Brigida Scotti A. Gerilyn Pilgrim, M.D. DATE OF BIRTH:  [birth year]  DATE OF STUDY:  11/14/2012                           NOCTURNAL POLYSOMNOGRAM  REFERRING PHYSICIAN:  Anselm Pancoast OXFORD  INDICATION:  A 27 year old who had a previous study documented obstructive sleep apnea syndrome.  This is a CPAP titration recording.  MEDICATIONS:  Symbicort.  EPWORTH SLEEPINESS SCALE:  14.  BMI:  41.  ARCHITECTURAL SUMMARY:  The total recording time is 388 minutes.  Sleep efficiency 85%.  Sleep latency 19 minutes.  REM latency is 64 minutes. Stage N1 4%, N2 45%, N3 22% and REM sleep 29%.  RESPIRATORY SUMMARY:  Baseline oxygen saturation is 98, lowest saturation 93.  The patient was placed on positive pressure starting at 5 and increased to 9.  Optimal pressure is 9 with resolution of events and good tolerance.  LIMB MOVEMENT SUMMARY:  PLM index 0.  ELECTROCARDIOGRAM SUMMARY:  Average heart rate is 68 with no significant dysrhythmias observed.  IMPRESSION:  Obstructive sleep apnea syndrome, which responds well to a CPAP of 9.  Thanks for this referral.     Leanny Moeckel A. Gerilyn Pilgrim, M.D.    KAD/MEDQ  D:  11/22/2012 17:56:09  T:  11/22/2012 18:17:38  Job:  [id number]

## 2012-11-26 ENCOUNTER — Other Ambulatory Visit: Payer: Self-pay | Admitting: Family Medicine

## 2012-11-26 DIAGNOSIS — G4733 Obstructive sleep apnea (adult) (pediatric): Secondary | ICD-10-CM

## 2012-12-08 ENCOUNTER — Other Ambulatory Visit: Payer: Self-pay | Admitting: Family Medicine

## 2012-12-12 ENCOUNTER — Ambulatory Visit: Payer: Medicaid Other | Admitting: Family Medicine

## 2013-02-20 ENCOUNTER — Encounter: Payer: Self-pay | Admitting: Family Medicine

## 2013-02-20 ENCOUNTER — Ambulatory Visit (INDEPENDENT_AMBULATORY_CARE_PROVIDER_SITE_OTHER): Payer: Medicaid Other | Admitting: Family Medicine

## 2013-02-20 VITALS — BP 130/70 | HR 93 | Temp 99.2°F | Ht 66.0 in | Wt 265.0 lb

## 2013-02-20 DIAGNOSIS — M5430 Sciatica, unspecified side: Secondary | ICD-10-CM

## 2013-02-20 MED ORDER — METHYLPREDNISOLONE (PAK) 4 MG PO TABS: ORAL_TABLET | ORAL | Status: DC

## 2013-02-20 MED ORDER — HYDROCODONE-ACETAMINOPHEN 5-325 MG PO TABS: 1.0000 | ORAL_TABLET | Freq: Four times a day (QID) | ORAL | Status: DC | PRN

## 2013-02-20 NOTE — Progress Notes (Signed)
   Subjective:    Patient ID: Irma NewnessJacqueline Stallings, female    DOB: [birth year], 28 y.o.   MRN: [id number]  HPI  This [age] y.o. female presents for evaluation of back pain left side radiating down her left buttock To her left knee.  Review of Systems C/o back pain No chest pain, SOB, HA, dizziness, vision change, N/V, diarrhea, constipation, dysuria, urinary urgency or frequency or rash.     Objective:   Physical Exam  Vital signs noted  Well developed well nourished female.  HEENT - Head atraumatic Normocephalic                Eyes - PERRLA, Conjuctiva - clear Sclera- Clear EOMI                Ears - EAC's Wnl TM's Wnl Gross Hearing WNL                Nose - Nares patent                 Throat - oropharanx wnl Respiratory - Lungs CTA bilateral Cardiac - RRR S1 and S2 without murmur MS - TTP left LS muscles.      Assessment & Plan:  Sciatica - Plan: methylPREDNIsolone (MEDROL DOSPACK) 4 MG tablet, HYDROcodone-acetaminophen (NORCO) 5-325 MG per tablet  Deatra CanterWilliam J Jearlene Bridwell FNP

## 2013-03-02 ENCOUNTER — Ambulatory Visit: Payer: Medicaid Other | Admitting: Family Medicine

## 2013-03-06 ENCOUNTER — Ambulatory Visit (INDEPENDENT_AMBULATORY_CARE_PROVIDER_SITE_OTHER): Payer: Medicaid Other | Admitting: Family Medicine

## 2013-03-06 ENCOUNTER — Ambulatory Visit: Payer: Medicaid Other

## 2013-03-06 ENCOUNTER — Encounter: Payer: Self-pay | Admitting: Family Medicine

## 2013-03-06 VITALS — BP 121/64 | HR 67 | Temp 99.2°F | Ht 66.0 in | Wt 268.4 lb

## 2013-03-06 DIAGNOSIS — N912 Amenorrhea, unspecified: Secondary | ICD-10-CM

## 2013-03-06 DIAGNOSIS — Z3201 Encounter for pregnancy test, result positive: Secondary | ICD-10-CM

## 2013-03-06 DIAGNOSIS — M5430 Sciatica, unspecified side: Secondary | ICD-10-CM

## 2013-03-06 DIAGNOSIS — M549 Dorsalgia, unspecified: Secondary | ICD-10-CM

## 2013-03-06 LAB — POCT URINE PREGNANCY: Preg Test, Ur: POSITIVE

## 2013-03-06 MED ORDER — HYDROCODONE-ACETAMINOPHEN 5-325 MG PO TABS: 1.0000 | ORAL_TABLET | Freq: Four times a day (QID) | ORAL | Status: DC | PRN

## 2013-03-06 MED ORDER — NAPROXEN 500 MG PO TABS: 500.0000 mg | ORAL_TABLET | Freq: Two times a day (BID) | ORAL | Status: DC

## 2013-03-06 MED ORDER — PRENATAL VITAMINS PLUS 27-1 MG PO TABS: 1.0000 | ORAL_TABLET | Freq: Every morning | ORAL | Status: DC

## 2013-03-06 NOTE — Progress Notes (Signed)
   Subjective:    Patient ID: [name], female    DOB: 06/16/1985, 28 y.o.   MRN: [id number]  HPI  This 28 y.o. female presents for evaluation of back pain for over a week. She presented a week Ago for sciatic pain and she was tx'd with medrol dose pack and norco and she is better in her Legs but is having the pain in her back and it is moderate to severe in severity.  She hasn't had Menstrual cycle in a few months.  Review of Systems C/o back pain and amenorrhea No chest pain, SOB, HA, dizziness, vision change, N/V, diarrhea, constipation, dysuria, urinary urgency or frequency, myalgias, arthralgias or rash.     Objective:   Physical Exam Vital signs noted  Well developed well nourished female.  HEENT - Head atraumatic Normocephalic Respiratory - Lungs CTA bilateral Cardiac - RRR S1 and S2 without murmur GI - Abdomen soft Nontender and bowel sounds active x 4 Extremities - No edema. Neuro - Grossly intact. MS - TTP LS spine and full ROM LS spine      Assessment & Plan:  Back pain - Plan: HYDROcodone-acetaminophen (NORCO) 5-325 MG per tablet, naproxen (NAPROSYN) 500 MG tablet, CANCELED: DG Lumbar Spine 2-3 Views  Sciatica - Plan: HYDROcodone-acetaminophen (NORCO) 5-325 MG per tablet, naproxen (NAPROSYN) 500 MG tablet  Amenorrhea - Plan: POCT urine pregnancy, Prenatal Vit-Fe Fumarate-FA (PRENATAL VITAMINS PLUS) 27-1 MG TABS  Positive pregnancy test - Plan: Prenatal Vit-Fe Fumarate-FA (PRENATAL VITAMINS PLUS) 27-1 MG TABS  Deatra CanterWilliam J Loray Akard FNP

## 2013-03-06 NOTE — Patient Instructions (Signed)

## 2013-11-18 ENCOUNTER — Ambulatory Visit: Payer: Medicaid Other | Admitting: Family Medicine

## 2013-11-19 ENCOUNTER — Ambulatory Visit (INDEPENDENT_AMBULATORY_CARE_PROVIDER_SITE_OTHER): Payer: Medicaid Other | Admitting: Family Medicine

## 2013-11-19 ENCOUNTER — Encounter: Payer: Self-pay | Admitting: Family Medicine

## 2013-11-19 VITALS — BP 128/85 | HR 85 | Temp 98.7°F | Ht 66.0 in | Wt 278.2 lb

## 2013-11-19 DIAGNOSIS — M5442 Lumbago with sciatica, left side: Secondary | ICD-10-CM

## 2013-11-19 DIAGNOSIS — M5432 Sciatica, left side: Secondary | ICD-10-CM

## 2013-11-19 MED ORDER — HYDROCODONE-ACETAMINOPHEN 5-325 MG PO TABS
1.0000 | ORAL_TABLET | Freq: Four times a day (QID) | ORAL | Status: DC | PRN
Start: 2013-11-19 — End: 2014-05-13

## 2013-11-19 MED ORDER — NAPROXEN 500 MG PO TABS: 500.0000 mg | ORAL_TABLET | Freq: Two times a day (BID) | ORAL | Status: DC

## 2013-11-19 NOTE — Progress Notes (Signed)
   Subjective:    Patient ID: [name], female    DOB: 1985/03/31, 28 y.o.   MRN: [id number]  HPI C/o left back pain radiating down left buttock  Review of Systems  Constitutional: Negative for fever.  HENT: Negative for ear pain.   Eyes: Negative for discharge.  Respiratory: Negative for cough.   Cardiovascular: Negative for chest pain.  Gastrointestinal: Negative for abdominal distention.  Endocrine: Negative for polyuria.  Genitourinary: Negative for difficulty urinating.  Musculoskeletal: Negative for gait problem and neck pain.  Skin: Negative for color change and rash.  Neurological: Negative for speech difficulty and headaches.  Psychiatric/Behavioral: Negative for agitation.       Objective:    BP 128/85 mmHg  Pulse 85  Temp(Src) 98.7 F (37.1 C) (Oral)  Ht 5\' 6"  (1.676 m)  Wt 278 lb 3.2 oz (126.191 kg)  BMI 44.92 kg/m2  LMP 11/07/2013 Physical Exam  Constitutional: She is oriented to person, place, and time. She appears well-developed and well-nourished.  HENT:  Head: Normocephalic and atraumatic.  Mouth/Throat: Oropharynx is clear and moist.  Eyes: Pupils are equal, round, and reactive to light.  Neck: Normal range of motion. Neck supple.  Cardiovascular: Normal rate and regular rhythm.   No murmur heard. Pulmonary/Chest: Effort normal and breath sounds normal.  Abdominal: Soft. Bowel sounds are normal. There is no tenderness.  Neurological: She is alert and oriented to person, place, and time.  Skin: Skin is warm and dry.  Psychiatric: She has a normal mood and affect.          Assessment & Plan:     ICD-9-CM ICD-10-CM   1. Sciatica, left 724.3 M54.32 HYDROcodone-acetaminophen (NORCO) 5-325 MG per tablet     naproxen (NAPROSYN) 500 MG tablet  2. Left-sided low back pain with left-sided sciatica 724.3 M54.42 HYDROcodone-acetaminophen (NORCO) 5-325 MG per tablet     naproxen (NAPROSYN) 500 MG tablet     No Follow-up on  file.  Deatra CanterWilliam J Renelle Stegenga FNP

## 2014-05-03 ENCOUNTER — Other Ambulatory Visit: Payer: Medicaid Other | Admitting: Family

## 2014-05-13 ENCOUNTER — Encounter: Payer: Self-pay | Admitting: Nurse Practitioner

## 2014-05-13 ENCOUNTER — Ambulatory Visit (INDEPENDENT_AMBULATORY_CARE_PROVIDER_SITE_OTHER): Payer: Medicaid Other | Admitting: Nurse Practitioner

## 2014-05-13 VITALS — BP 130/74 | HR 82 | Temp 98.5°F | Ht 66.0 in | Wt 286.0 lb

## 2014-05-13 DIAGNOSIS — M5441 Lumbago with sciatica, right side: Secondary | ICD-10-CM

## 2014-05-13 MED ORDER — PREDNISONE 20 MG PO TABS: ORAL_TABLET | ORAL | Status: DC

## 2014-05-13 MED ORDER — CYCLOBENZAPRINE HCL 10 MG PO TABS: 10.0000 mg | ORAL_TABLET | Freq: Three times a day (TID) | ORAL | Status: DC | PRN

## 2014-05-13 NOTE — Progress Notes (Signed)
   Subjective:    Patient ID: Irma NewnessJacqueline Pierron, female    DOB: Apr 19, 1985, 29 y.o.   MRN: [id number]  HPI Patient in today c/o low back pain that is radiating down right leg to knee. Started 2 weeks- denies injury- Rates pain 8/10. Slight numbness in right buttocks. Moving increases pain. Heating pad helps with pain.    Review of Systems  Constitutional: Negative.   HENT: Negative.   Eyes: Negative.   Respiratory: Negative.   Cardiovascular: Negative.   Genitourinary: Negative.  Negative for dysuria, urgency, frequency, menstrual problem and pelvic pain.  Neurological: Negative.   Psychiatric/Behavioral: Negative.   All other systems reviewed and are negative.      Objective:   Physical Exam  Constitutional: She is oriented to person, place, and time. She appears well-developed and well-nourished. No distress.  Cardiovascular: Normal rate, regular rhythm and normal heart sounds.   Pulmonary/Chest: Effort normal and breath sounds normal.  Musculoskeletal:  Decrease rom of lumbar spine with pain on flexion extension and rotation (-) SLR bil Motor strength and sensation distally intact  Neurological: She is alert and oriented to person, place, and time.  Skin: Skin is warm and dry.  Psychiatric: She has a normal mood and affect. Her behavior is normal. Judgment and thought content normal.    BP 130/74 mmHg  Pulse 82  Temp(Src) 98.5 F (36.9 C) (Oral)  Ht 5\' 6"  (1.676 m)  Wt 286 lb (129.729 kg)  BMI 46.18 kg/m2       Assessment & Plan:   1. Right-sided low back pain with right-sided sciatica    Meds ordered this encounter  Medications  . predniSONE (DELTASONE) 20 MG tablet    Sig: 2 po at sametime daily for 5 days    Dispense:  10 tablet    Refill:  0    Order Specific Question:  Supervising Provider    Answer:  Ernestina PennaMOORE, DONALD W [[id number]]  . cyclobenzaprine (FLEXERIL) 10 MG tablet    Sig: Take 1 tablet (10 mg total) by mouth 3 (three) times daily as needed for  muscle spasms.    Dispense:  30 tablet    Refill:  1    Order Specific Question:  Supervising Provider    Answer:  Ernestina PennaMOORE, DONALD W [[id number]]   Moist heat Rest No heavy lifting Follow up in 2 weeks if no better  Mary-Margaret Daphine DeutscherMartin, FNP

## 2014-05-13 NOTE — Patient Instructions (Signed)
Back Pain, Adult Low back pain is very common. About 1 in 5 people have back pain.The cause of low back pain is rarely dangerous. The pain often gets better over time.About half of people with a sudden onset of back pain feel better in just 2 weeks. About 8 in 10 people feel better by 6 weeks.  CAUSES Some common causes of back pain include:  Strain of the muscles or ligaments supporting the spine.  Wear and tear (degeneration) of the spinal discs.  Arthritis.  Direct injury to the back. DIAGNOSIS Most of the time, the direct cause of low back pain is not known.However, back pain can be treated effectively even when the exact cause of the pain is unknown.Answering your caregiver's questions about your overall health and symptoms is one of the most accurate ways to make sure the cause of your pain is not dangerous. If your caregiver needs more information, he or she may order lab work or imaging tests (X-rays or MRIs).However, even if imaging tests show changes in your back, this usually does not require surgery. HOME CARE INSTRUCTIONS For many people, back pain returns.Since low back pain is rarely dangerous, it is often a condition that people can learn to manageon their own.   Remain active. It is stressful on the back to sit or stand in one place. Do not sit, drive, or stand in one place for more than 30 minutes at a time. Take short walks on level surfaces as soon as pain allows.Try to increase the length of time you walk each day.  Do not stay in bed.Resting more than 1 or 2 days can delay your recovery.  Do not avoid exercise or work.Your body is made to move.It is not dangerous to be active, even though your back may hurt.Your back will likely heal faster if you return to being active before your pain is gone.  Pay attention to your body when you bend and lift. Many people have less discomfortwhen lifting if they bend their knees, keep the load close to their bodies,and  avoid twisting. Often, the most comfortable positions are those that put less stress on your recovering back.  Find a comfortable position to sleep. Use a firm mattress and lie on your side with your knees slightly bent. If you lie on your back, put a pillow under your knees.  Only take over-the-counter or prescription medicines as directed by your caregiver. Over-the-counter medicines to reduce pain and inflammation are often the most helpful.Your caregiver may prescribe muscle relaxant drugs.These medicines help dull your pain so you can more quickly return to your normal activities and healthy exercise.  Put ice on the injured area.  Put ice in a plastic bag.  Place a towel between your skin and the bag.  Leave the ice on for 15-20 minutes, 03-04 times a day for the first 2 to 3 days. After that, ice and heat may be alternated to reduce pain and spasms.  Ask your caregiver about trying back exercises and gentle massage. This may be of some benefit.  Avoid feeling anxious or stressed.Stress increases muscle tension and can worsen back pain.It is important to recognize when you are anxious or stressed and learn ways to manage it.Exercise is a great option. SEEK MEDICAL CARE IF:  You have pain that is not relieved with rest or medicine.  You have pain that does not improve in 1 week.  You have new symptoms.  You are generally not feeling well. SEEK   IMMEDIATE MEDICAL CARE IF:   You have pain that radiates from your back into your legs.  You develop new bowel or bladder control problems.  You have unusual weakness or numbness in your arms or legs.  You develop nausea or vomiting.  You develop abdominal pain.  You feel faint. Document Released: 12/25/2004 Document Revised: 06/26/2011 Document Reviewed: 04/28/2013 Exit[employer] Patient Information 2015 [employer]. This information is not intended to replace advice given to you by your health care provider. Make sure you  discuss any questions you have with your health care provider.  

## 2014-08-09 ENCOUNTER — Ambulatory Visit (INDEPENDENT_AMBULATORY_CARE_PROVIDER_SITE_OTHER): Payer: Medicaid Other | Admitting: Physician Assistant

## 2014-08-09 ENCOUNTER — Encounter: Payer: Self-pay | Admitting: Physician Assistant

## 2014-08-09 VITALS — BP 134/84 | HR 79 | Temp 98.6°F | Ht 66.0 in | Wt 276.0 lb

## 2014-08-09 DIAGNOSIS — Z3042 Encounter for surveillance of injectable contraceptive: Secondary | ICD-10-CM

## 2014-08-09 DIAGNOSIS — R300 Dysuria: Secondary | ICD-10-CM

## 2014-08-09 DIAGNOSIS — R350 Frequency of micturition: Secondary | ICD-10-CM

## 2014-08-09 DIAGNOSIS — N760 Acute vaginitis: Secondary | ICD-10-CM

## 2014-08-09 DIAGNOSIS — B9689 Other specified bacterial agents as the cause of diseases classified elsewhere: Secondary | ICD-10-CM

## 2014-08-09 DIAGNOSIS — A499 Bacterial infection, unspecified: Secondary | ICD-10-CM

## 2014-08-09 DIAGNOSIS — Z124 Encounter for screening for malignant neoplasm of cervix: Secondary | ICD-10-CM

## 2014-08-09 LAB — POCT URINALYSIS DIPSTICK
BILIRUBIN UA: NEGATIVE
Glucose, UA: NEGATIVE
Ketones, UA: NEGATIVE
Leukocytes, UA: NEGATIVE
Nitrite, UA: NEGATIVE
Spec Grav, UA: >=1.03
UROBILINOGEN UA: NEGATIVE
pH, UA: 6

## 2014-08-09 LAB — POCT UA - MICROSCOPIC ONLY
CASTS, UR, LPF, POC: NEGATIVE
CRYSTALS, UR, HPF, POC: NEGATIVE
Yeast, UA: NEGATIVE

## 2014-08-09 MED ORDER — METRONIDAZOLE 500 MG PO TABS: 500.0000 mg | ORAL_TABLET | Freq: Three times a day (TID) | ORAL | Status: DC

## 2014-08-09 NOTE — Progress Notes (Signed)
   Subjective:    Patient ID: [name], female    DOB: 09/04/1985, 29 y.o.   MRN: [id number]  HPI 29 y/o female presents for pap smear and consult on birthd control. Has not had abnormal results in the past. No recent h/o std's. Last pap was 1 year ago. Last menstrual period was July 2nd.   She is interested in Depo for birth control options. She also c/o frequent urination and pain with urination    Review of Systems  Constitutional: Negative.   HENT: Negative.   Endocrine: Positive for polyuria.  Genitourinary: Positive for dysuria (burning ), urgency, frequency and decreased urine volume (small amount when voiding frequently ). Negative for hematuria.  Musculoskeletal: Positive for back pain.  Skin: Negative.   Allergic/Immunologic: Negative.   Neurological: Negative.   Hematological: Negative.   Psychiatric/Behavioral: Negative.        Objective:   Physical Exam  Constitutional: She is oriented to person, place, and time. She appears well-developed and well-nourished.  Genitourinary: Vagina normal. No breast swelling, tenderness, discharge or bleeding. Pelvic exam was performed with patient supine. There is no rash, tenderness, lesion or injury on the right labia. There is no rash, tenderness, lesion or injury on the left labia. No erythema, tenderness or bleeding in the vagina. No foreign body around the vagina. No vaginal discharge found.  Neurological: She is alert and oriented to person, place, and time.  Psychiatric: She has a normal mood and affect. Her behavior is normal. Judgment and thought content normal.  Nursing note and vitals reviewed.         Assessment & Plan:  1. Urinary frequency  - POCT UA - Microscopic Only - POCT urinalysis dipstick  2. Dysuria  - POCT UA - Microscopic Only - POCT urinalysis dipstick  3. Encounter for Depo-Provera contraception  - hCG, quantitative, pregnancy  4. Bacterial vaginosis  - metroNIDAZOLE (FLAGYL)  500 MG tablet; Take 1 tablet (500 mg total) by mouth 3 (three) times daily.  Dispense: 21 tablet; Refill: 0  5. Screening for cervical cancer  - Pap IG, CT/NG w/ reflex HPV when ASC-U   RTO 1 year   Audreena Sachdeva A. Chauncey Reading PA-C

## 2014-08-11 LAB — PAP IG, CT-NG, RFX HPV ASCU
CHLAMYDIA, NUC. ACID AMP: NEGATIVE
Gonococcus by Nucleic Acid Amp: NEGATIVE
PAP Smear Comment: 0

## 2014-08-11 LAB — BETA HCG QUANT (REF LAB): HCG QUANT: 50 m[IU]/mL

## 2014-08-11 LAB — SPECIMEN STATUS REPORT

## 2014-10-11 ENCOUNTER — Telehealth: Payer: Self-pay | Admitting: Nurse Practitioner

## 2014-10-11 DIAGNOSIS — N912 Amenorrhea, unspecified: Secondary | ICD-10-CM

## 2014-10-11 NOTE — Telephone Encounter (Signed)
Referral made 

## 2014-10-11 NOTE — Telephone Encounter (Signed)
Pt has MCD ins, her LMP was 8/5 needs referral for pregnancy to [facility]

## 2014-10-14 ENCOUNTER — Other Ambulatory Visit: Payer: Self-pay | Admitting: Obstetrics and Gynecology

## 2014-10-14 DIAGNOSIS — O3680X0 Pregnancy with inconclusive fetal viability, not applicable or unspecified: Secondary | ICD-10-CM

## 2014-10-15 ENCOUNTER — Other Ambulatory Visit: Payer: Self-pay | Admitting: *Deleted

## 2014-10-15 ENCOUNTER — Other Ambulatory Visit: Payer: Self-pay | Admitting: Obstetrics and Gynecology

## 2014-10-15 ENCOUNTER — Other Ambulatory Visit: Payer: Medicaid Other

## 2014-10-15 ENCOUNTER — Ambulatory Visit (INDEPENDENT_AMBULATORY_CARE_PROVIDER_SITE_OTHER): Payer: Medicaid Other

## 2014-10-15 DIAGNOSIS — Z3682 Encounter for antenatal screening for nuchal translucency: Secondary | ICD-10-CM

## 2014-10-15 DIAGNOSIS — Z36 Encounter for antenatal screening of mother: Secondary | ICD-10-CM | POA: Diagnosis not present

## 2014-10-15 DIAGNOSIS — O3680X0 Pregnancy with inconclusive fetal viability, not applicable or unspecified: Secondary | ICD-10-CM

## 2014-10-15 NOTE — Progress Notes (Signed)
[place] 13 wks,single IUP pos fht 144 bpm,normal ov's bilat,crl 68.93mm,NT 2.21mm,NB present,post pl

## 2014-10-18 LAB — MATERNAL SCREEN, INTEGRATED #1
Crown Rump Length: 68.8 mm
GEST. AGE ON COLLECTION DATE: 12.9 wk
Maternal Age at EDD: 28.6 years
Nuchal Translucency (NT): 2 mm
Number of Fetuses: 1
PAPP-A VALUE: 882.1 ng/mL
WEIGHT: 272 [lb_av]

## 2014-10-25 ENCOUNTER — Encounter: Payer: Medicaid Other | Admitting: Women's Health

## 2014-11-04 ENCOUNTER — Encounter: Payer: Medicaid Other | Admitting: Advanced Practice Midwife

## 2014-11-04 ENCOUNTER — Encounter: Payer: Self-pay | Admitting: Advanced Practice Midwife

## 2015-01-07 ENCOUNTER — Other Ambulatory Visit: Payer: Self-pay | Admitting: Obstetrics and Gynecology

## 2015-01-07 DIAGNOSIS — Z1389 Encounter for screening for other disorder: Secondary | ICD-10-CM

## 2015-01-09 NOTE — L&D Delivery Note (Signed)
Patient is 30 y.o. [id number] 4058w4d admitted for active labor, hx of Late prenatal care, tob use during pregnancy   Delivery Note At 11:06 PM a viable female was delivered via Vaginal, Spontaneous Delivery (Presentation: Right Occiput Anterior).  APGAR: 8, 9; weight 9 lb 8 oz (4310 g). Infant dried and placed on mother's abdomen.  Cord clamped and cut by FOB.  Hospital cord blood sample collected.  Placenta delivered, fundal massage applied, and vagina inspected for lacerations.  No lacerations appreciated.  Placenta status: Intact, Spontaneous.  Cord: 3 vessels with the following complications: None.   Anesthesia: None  Episiotomy: None Lacerations: None Suture Repair: n/a Est. Blood Loss (mL): 210  Mom to postpartum.  Baby to Couplet care / Skin to Skin.  Attending, Dr Lyndel SafeKimberly Daneshia Tavano, was gloved and present for the entirety of the delivery.  Delynn FlavinAshly Gottschalk, DO 04/12/2015, 11:44 PM   OB fellow attestation: Patient is a [id number] at 2458w4d who was admitted in active labor, significant hx of grand multiparity/uncomplicated prenatal course.   I was gloved and present for delivery in its entirety.  Second stage of labor progressed. No decels during second stage noted.  Complications: none  Lacerations: none  EBL: 210  Federico FlakeKimberly Niles Roen Macgowan, MD 1:34 AM

## 2015-01-11 ENCOUNTER — Encounter: Payer: Medicaid Other | Admitting: Women's Health

## 2015-01-11 ENCOUNTER — Other Ambulatory Visit: Payer: Medicaid Other

## 2015-01-14 ENCOUNTER — Other Ambulatory Visit: Payer: Medicaid Other

## 2015-01-17 ENCOUNTER — Ambulatory Visit (INDEPENDENT_AMBULATORY_CARE_PROVIDER_SITE_OTHER): Payer: Medicaid Other

## 2015-01-17 DIAGNOSIS — Z3A26 26 weeks gestation of pregnancy: Secondary | ICD-10-CM | POA: Diagnosis not present

## 2015-01-17 DIAGNOSIS — Z36 Encounter for antenatal screening of mother: Secondary | ICD-10-CM

## 2015-01-17 DIAGNOSIS — O99212 Obesity complicating pregnancy, second trimester: Secondary | ICD-10-CM | POA: Diagnosis not present

## 2015-01-17 DIAGNOSIS — O99332 Smoking (tobacco) complicating pregnancy, second trimester: Secondary | ICD-10-CM | POA: Diagnosis not present

## 2015-01-17 DIAGNOSIS — Z1389 Encounter for screening for other disorder: Secondary | ICD-10-CM

## 2015-01-17 NOTE — Progress Notes (Signed)
US 26+3wks,breech,post pl gr 0,cx 4.2 cm,svp of fluid 5.3cm,bilat adnexa's wnl,efw 996 g 57%,fhr 145 bpm,anatomy complete no obvious abnormalities seen

## 2015-01-20 ENCOUNTER — Encounter: Payer: Medicaid Other | Admitting: Advanced Practice Midwife

## 2015-01-20 ENCOUNTER — Encounter: Payer: Self-pay | Admitting: Advanced Practice Midwife

## 2015-03-02 ENCOUNTER — Encounter: Payer: Medicaid Other | Admitting: Women's Health

## 2015-03-09 ENCOUNTER — Ambulatory Visit (INDEPENDENT_AMBULATORY_CARE_PROVIDER_SITE_OTHER): Payer: Medicaid Other | Admitting: Advanced Practice Midwife

## 2015-03-09 ENCOUNTER — Encounter: Payer: Self-pay | Admitting: Advanced Practice Midwife

## 2015-03-09 VITALS — BP 130/60 | HR 76 | Wt 268.0 lb

## 2015-03-09 DIAGNOSIS — O0933 Supervision of pregnancy with insufficient antenatal care, third trimester: Secondary | ICD-10-CM

## 2015-03-09 DIAGNOSIS — O0930 Supervision of pregnancy with insufficient antenatal care, unspecified trimester: Secondary | ICD-10-CM | POA: Insufficient documentation

## 2015-03-09 DIAGNOSIS — Z331 Pregnant state, incidental: Secondary | ICD-10-CM

## 2015-03-09 DIAGNOSIS — Z3490 Encounter for supervision of normal pregnancy, unspecified, unspecified trimester: Secondary | ICD-10-CM | POA: Insufficient documentation

## 2015-03-09 DIAGNOSIS — Z3493 Encounter for supervision of normal pregnancy, unspecified, third trimester: Secondary | ICD-10-CM

## 2015-03-09 DIAGNOSIS — Z0283 Encounter for blood-alcohol and blood-drug test: Secondary | ICD-10-CM

## 2015-03-09 DIAGNOSIS — Z369 Encounter for antenatal screening, unspecified: Secondary | ICD-10-CM

## 2015-03-09 DIAGNOSIS — Z1389 Encounter for screening for other disorder: Secondary | ICD-10-CM

## 2015-03-09 DIAGNOSIS — Z131 Encounter for screening for diabetes mellitus: Secondary | ICD-10-CM

## 2015-03-09 NOTE — Patient Instructions (Signed)

## 2015-03-09 NOTE — Progress Notes (Signed)
  Subjective:    Saraphina Lauderbaugh is a [id number] [redacted]w[redacted]d being seen today for her first obstetrical visit.  Her obstetrical history is significant for 4 SVDs at term without problems.  Pregnancy history fully reviewed.  Patient reports no complaints.  Filed Vitals:   03/09/15 0957  BP: 130/60  Pulse: 76  Weight: 268 lb (121.564 kg)    HISTORY: OB History  Gravida Para Term Preterm AB SAB TAB Ectopic Multiple Living  # Outcome Date GA Lbr Len/2nd Weight Sex Delivery Anes PTL Lv  5 Current           4 Term 06/17/13 [redacted]w[redacted]d   M Vag-Spont None N   3 Gravida 05/30/12    F Vag-Spont EPI N Y  2 Term 01/26/09 [redacted]w[redacted]d   M Vag-Spont None N Y  1 Term 02/12/07 [redacted]w[redacted]d   M Vag-Spont EPI N      History reviewed. No pertinent past medical history. Past Surgical History  Procedure Laterality Date  . Left arm surgery    . Cholecystectomy  2009   No family history on file.   Exam                       Uterus Normal, Gravid, FH: 37, but felt to be d/t panus      Cervix Multiparous, thick, 1-2, ballottable          System:     Skin: normal coloration and turgor, no rashes    Neurologic: oriented, normal, normal mood   Extremities: normal strength, tone, and muscle mass   HEENT PERRLA  50 Mouth/Teeth mucous membranes moist, normal dentition   Neck supple and no masses   Cardiovascular: regular rate and rhythm   Respiratory:  appears well, vitals normal, no respiratory distress, acyanotic   Abdomen: soft, non-tender;  FHR: 1          Assessment:    Pregnancy: [id number] Patient Active Problem List   Diagnosis Date Noted  . Late prenatal care 03/09/2015  . Supervision of normal pregnancy 03/09/2015        Plan:     Initial labs drawn, included 1 hour gtt Continue prenatal vitamins  Problem list reviewed and updated  Reviewed recommended weight gain based on pre-gravid BMI . Little to noi weight gain recommended Encouraged well-balanced diet Genetic  Screening discussed Integrated Screen: Did NT (normal) but didn't come back for 2ndf part.  Ultrasound discussed; fetal survey: results reviewed.  Return in about 2 weeks (around 03/23/2015) for LROB.  CRESENZO-DISHMAN,Gael Londo 03/09/2015

## 2015-03-10 LAB — HEPATITIS B SURFACE ANTIGEN: Hepatitis B Surface Ag: NEGATIVE

## 2015-03-10 LAB — PMP SCREEN PROFILE (10S), URINE
AMPHETAMINE SCRN UR: NEGATIVE ng/mL
Barbiturate Screen, Ur: NEGATIVE ng/mL
Benzodiazepine Screen, Urine: NEGATIVE ng/mL
COCAINE(METAB.) SCREEN, URINE: NEGATIVE ng/mL
CREATININE(CRT), U: 30.5 mg/dL (ref 20.0–300.0)
Cannabinoids Ur Ql Scn: NEGATIVE ng/mL
METHADONE SCREEN, URINE: NEGATIVE ng/mL
OPIATE SCRN UR: NEGATIVE ng/mL
Oxycodone+Oxymorphone Ur Ql Scn: NEGATIVE ng/mL
PCP SCRN UR: NEGATIVE ng/mL
PROPOXYPHENE SCREEN: NEGATIVE ng/mL
Ph of Urine: 6.4 (ref 4.5–8.9)

## 2015-03-10 LAB — SICKLE CELL SCREEN: Sickle Cell Screen: NEGATIVE

## 2015-03-10 LAB — CBC
HEMOGLOBIN: 12.2 g/dL (ref 11.1–15.9)
Hematocrit: 36.2 % (ref 34.0–46.6)
MCH: 31.3 pg (ref 26.6–33.0)
MCHC: 33.7 g/dL (ref 31.5–35.7)
MCV: 93 fL (ref 79–97)
PLATELETS: 278 10*3/uL (ref 150–379)
RBC: 3.9 x10E6/uL (ref 3.77–5.28)
RDW: 13.7 % (ref 12.3–15.4)
WBC: 8.5 10*3/uL (ref 3.4–10.8)

## 2015-03-10 LAB — URINALYSIS, ROUTINE W REFLEX MICROSCOPIC
Bilirubin, UA: NEGATIVE
GLUCOSE, UA: NEGATIVE
Ketones, UA: NEGATIVE
Leukocytes, UA: NEGATIVE
Nitrite, UA: NEGATIVE
PROTEIN UA: NEGATIVE
RBC, UA: NEGATIVE
SPEC GRAV UA: 1.006 (ref 1.005–1.030)
Urobilinogen, Ur: 0.2 mg/dL (ref 0.2–1.0)
pH, UA: 7 (ref 5.0–7.5)

## 2015-03-10 LAB — ANTIBODY SCREEN: Antibody Screen: NEGATIVE

## 2015-03-10 LAB — ABO/RH: RH TYPE: POSITIVE

## 2015-03-10 LAB — HIV ANTIBODY (ROUTINE TESTING W REFLEX): HIV Screen 4th Generation wRfx: NONREACTIVE

## 2015-03-10 LAB — VARICELLA ZOSTER ANTIBODY, IGG: VARICELLA: 292 {index} (ref >165)

## 2015-03-10 LAB — GC/CHLAMYDIA PROBE AMP
Chlamydia trachomatis, NAA: NEGATIVE
Neisseria gonorrhoeae by PCR: NEGATIVE

## 2015-03-10 LAB — RPR: RPR Ser Ql: NONREACTIVE

## 2015-03-10 LAB — GLUCOSE TOLERANCE, 1 HOUR: GLUCOSE, 1HR PP: 98 mg/dL (ref 65–199)

## 2015-03-10 LAB — RUBELLA SCREEN: RUBELLA: 1.39 {index} (ref >0.99)

## 2015-03-11 LAB — URINE CULTURE

## 2015-03-16 ENCOUNTER — Other Ambulatory Visit: Payer: Self-pay | Admitting: Advanced Practice Midwife

## 2015-03-16 ENCOUNTER — Telehealth: Payer: Self-pay | Admitting: *Deleted

## 2015-03-16 MED ORDER — NITROFURANTOIN MONOHYD MACRO 100 MG PO CAPS: 100.0000 mg | ORAL_CAPSULE | Freq: Two times a day (BID) | ORAL | Status: AC

## 2015-03-16 NOTE — Progress Notes (Signed)
+   urine cx.  rx Macrobid

## 2015-03-16 NOTE — Telephone Encounter (Signed)
Pt informed of + urine culture, Macrobid e-scribed. Pt verbalized understanding.

## 2015-03-23 ENCOUNTER — Encounter: Payer: Self-pay | Admitting: Advanced Practice Midwife

## 2015-03-23 ENCOUNTER — Ambulatory Visit (INDEPENDENT_AMBULATORY_CARE_PROVIDER_SITE_OTHER): Payer: Medicaid Other | Admitting: Advanced Practice Midwife

## 2015-03-23 ENCOUNTER — Encounter: Payer: Medicaid Other | Admitting: Advanced Practice Midwife

## 2015-03-23 VITALS — BP 110/60 | HR 76 | Wt 268.0 lb

## 2015-03-23 DIAGNOSIS — O26843 Uterine size-date discrepancy, third trimester: Secondary | ICD-10-CM

## 2015-03-23 DIAGNOSIS — O26849 Uterine size-date discrepancy, unspecified trimester: Secondary | ICD-10-CM | POA: Insufficient documentation

## 2015-03-23 DIAGNOSIS — Z3493 Encounter for supervision of normal pregnancy, unspecified, third trimester: Secondary | ICD-10-CM

## 2015-03-23 DIAGNOSIS — Z331 Pregnant state, incidental: Secondary | ICD-10-CM

## 2015-03-23 DIAGNOSIS — Z1389 Encounter for screening for other disorder: Secondary | ICD-10-CM

## 2015-03-23 LAB — POCT URINALYSIS DIPSTICK
GLUCOSE UA: NEGATIVE
Ketones, UA: NEGATIVE
LEUKOCYTES UA: NEGATIVE
NITRITE UA: NEGATIVE
Protein, UA: NEGATIVE
RBC UA: NEGATIVE

## 2015-03-23 NOTE — Progress Notes (Signed)
[id number] 4639w5d Estimated Date of Delivery: 04/22/15  Blood pressure 110/60, pulse 76, weight 268 lb (121.564 kg), last menstrual period 07/10/2014.   BP weight and urine results all reviewed and noted.  Please refer to the obstetrical flow sheet for the fundal height and fetal heart rate documentation:  Patient reports good fetal movement, denies any bleeding and no rupture of membranes symptoms or regular contractions. Patient is without complaints. All questions were answered.  Orders Placed This Encounter  Procedures  . US OB Follow Up  . POCT urinalysis dipstick    Plan:  Continued routine obstetrical care,   Return in about 1 week (around 03/30/2015) for LROB, US:EFW.

## 2015-03-24 ENCOUNTER — Encounter: Payer: Medicaid Other | Admitting: Advanced Practice Midwife

## 2015-03-29 ENCOUNTER — Encounter: Payer: Self-pay | Admitting: Advanced Practice Midwife

## 2015-03-29 ENCOUNTER — Ambulatory Visit (INDEPENDENT_AMBULATORY_CARE_PROVIDER_SITE_OTHER): Payer: Medicaid Other

## 2015-03-29 ENCOUNTER — Ambulatory Visit (INDEPENDENT_AMBULATORY_CARE_PROVIDER_SITE_OTHER): Payer: Medicaid Other | Admitting: Advanced Practice Midwife

## 2015-03-29 VITALS — BP 120/76 | HR 86 | Wt 265.0 lb

## 2015-03-29 DIAGNOSIS — O283 Abnormal ultrasonic finding on antenatal screening of mother: Secondary | ICD-10-CM

## 2015-03-29 DIAGNOSIS — O26843 Uterine size-date discrepancy, third trimester: Secondary | ICD-10-CM

## 2015-03-29 DIAGNOSIS — O358XX0 Maternal care for other (suspected) fetal abnormality and damage, not applicable or unspecified: Secondary | ICD-10-CM | POA: Insufficient documentation

## 2015-03-29 DIAGNOSIS — O35EXX0 Maternal care for other (suspected) fetal abnormality and damage, fetal genitourinary anomalies, not applicable or unspecified: Secondary | ICD-10-CM | POA: Insufficient documentation

## 2015-03-29 DIAGNOSIS — Z331 Pregnant state, incidental: Secondary | ICD-10-CM

## 2015-03-29 DIAGNOSIS — Z3493 Encounter for supervision of normal pregnancy, unspecified, third trimester: Secondary | ICD-10-CM

## 2015-03-29 DIAGNOSIS — Z369 Encounter for antenatal screening, unspecified: Secondary | ICD-10-CM

## 2015-03-29 DIAGNOSIS — Z1389 Encounter for screening for other disorder: Secondary | ICD-10-CM

## 2015-03-29 DIAGNOSIS — O0933 Supervision of pregnancy with insufficient antenatal care, third trimester: Secondary | ICD-10-CM

## 2015-03-29 LAB — POCT URINALYSIS DIPSTICK
Blood, UA: NEGATIVE
Glucose, UA: NEGATIVE
KETONES UA: NEGATIVE
LEUKOCYTES UA: NEGATIVE
NITRITE UA: NEGATIVE

## 2015-03-29 NOTE — Progress Notes (Signed)
[id number] 4050w4d Estimated Date of Delivery: 04/22/15  Blood pressure 120/76, pulse 86, weight 265 lb (120.203 kg), last menstrual period 07/10/2014.   BP weight and urine results all reviewed and noted.  Please refer to the obstetrical flow sheet for the fundal height and fetal heart rate documentation:US 36+4 wks,cephalic,fhr 140 bpm,bilat adnexa's wnl,post pl gr 3,afi 13.2cm,right pyelectasis 9 mm,normal left kidney,EFW 3553 g 87%  Patient reports good fetal movement, denies any bleeding and no rupture of membranes symptoms or regular contractions. Patient is without complaints. All questions were answered.  Orders Placed This Encounter  Procedures  . GC/Chlamydia Probe Amp  . Strep Gp B NAA  . POCT urinalysis dipstick    Plan:  Continued routine obstetrical care,   Return in about 1 week (around 04/05/2015) for LROB.

## 2015-03-29 NOTE — Progress Notes (Signed)
US 36+4 wks,cephalic,fhr 140 bpm,bilat adnexa's wnl,post pl gr 3,afi 13.2cm,right pyelectasis 9 mm,normal left kidney,EFW 3553 g 87%

## 2015-03-29 NOTE — Progress Notes (Signed)
Pt denies any problems or concerns at this time.  

## 2015-03-30 LAB — GC/CHLAMYDIA PROBE AMP
Chlamydia trachomatis, NAA: NEGATIVE
NEISSERIA GONORRHOEAE BY PCR: NEGATIVE

## 2015-03-31 LAB — STREP GP B NAA: STREP GROUP B AG: NEGATIVE

## 2015-04-05 ENCOUNTER — Ambulatory Visit (INDEPENDENT_AMBULATORY_CARE_PROVIDER_SITE_OTHER): Payer: Medicaid Other | Admitting: Women's Health

## 2015-04-05 ENCOUNTER — Encounter: Payer: Self-pay | Admitting: Women's Health

## 2015-04-05 VITALS — BP 122/56 | HR 80 | Wt 271.0 lb

## 2015-04-05 DIAGNOSIS — O2343 Unspecified infection of urinary tract in pregnancy, third trimester: Secondary | ICD-10-CM

## 2015-04-05 DIAGNOSIS — Z3493 Encounter for supervision of normal pregnancy, unspecified, third trimester: Secondary | ICD-10-CM

## 2015-04-05 DIAGNOSIS — Z1389 Encounter for screening for other disorder: Secondary | ICD-10-CM

## 2015-04-05 DIAGNOSIS — Z331 Pregnant state, incidental: Secondary | ICD-10-CM

## 2015-04-05 LAB — POCT URINALYSIS DIPSTICK
Blood, UA: NEGATIVE
Glucose, UA: NEGATIVE
Ketones, UA: NEGATIVE
NITRITE UA: NEGATIVE
PROTEIN UA: NEGATIVE

## 2015-04-05 NOTE — Progress Notes (Signed)
Low-risk OB appointment [id number] 3655w4d Estimated Date of Delivery: 04/22/15 BP 122/56 mmHg  Pulse 80  Wt 271 lb (122.925 kg)  LMP 07/10/2014 (Approximate)  BP, weight, and urine reviewed.  Refer to obstetrical flow sheet for FH & FHR.  Reports good fm.  Denies regular uc's, lof, vb, or uti s/s. No complaints. EFW 87% w/ AC 97% last week, Rt pylectasis w/ normal AFI. Denies h/o GDM. 1hr glucola this pregnancy 98. Largest baby 8lb3oz, no problems getting shoulders out. Pt [facility]- presented w/ advanced dilation. Wants BTL, too late to have in-hospital, will go ahead and sign consent today to have for interim BTL. Discussed risks/benefits of BTL, consent signed.  SVE: 4/80/ballotable, vtx per request Reviewed labor s/s, fkc. Plan:  Continue routine obstetrical care  F/U in 1wk for OB appointment

## 2015-04-05 NOTE — Patient Instructions (Signed)
Call the office ([phone]) or go to [facility] if:  You begin to have strong, frequent contractions  Your water breaks.  Sometimes it is a big gush of fluid, sometimes it is just a trickle that keeps getting your panties wet or running down your legs  You have vaginal bleeding.  It is normal to have a small amount of spotting if your cervix was checked.   You don't feel your baby moving like normal.  If you don't, get you something to eat and drink and lay down and focus on feeling your baby move.  You should feel at least 10 movements in 2 hours.  If you don't, you should call the office or go to [facility].    Braxton Hicks Contractions Contractions of the uterus can occur throughout pregnancy. Contractions are not always a sign that you are in labor.  WHAT ARE BRAXTON HICKS CONTRACTIONS?  Contractions that occur before labor are called Braxton Hicks contractions, or false labor. Toward the end of pregnancy (32-34 weeks), these contractions can develop more often and may become more forceful. This is not true labor because these contractions do not result in opening (dilatation) and thinning of the cervix. They are sometimes difficult to tell apart from true labor because these contractions can be forceful and people have different pain tolerances. You should not feel embarrassed if you go to the hospital with false labor. Sometimes, the only way to tell if you are in true labor is for your health care provider to look for changes in the cervix. If there are no prenatal problems or other health problems associated with the pregnancy, it is completely safe to be sent home with false labor and await the onset of true labor. HOW CAN YOU TELL THE DIFFERENCE BETWEEN TRUE AND FALSE LABOR? False Labor  The contractions of false labor are usually shorter and not as hard as those of true labor.   The contractions are usually irregular.   The contractions are often felt in the front of  the lower abdomen and in the groin.   The contractions may go away when you walk around or change positions while lying down.   The contractions get weaker and are shorter lasting as time goes on.   The contractions do not usually become progressively stronger, regular, and closer together as with true labor.  True Labor  Contractions in true labor last 30-70 seconds, become very regular, usually become more intense, and increase in frequency.   The contractions do not go away with walking.   The discomfort is usually felt in the top of the uterus and spreads to the lower abdomen and low back.   True labor can be determined by your health care provider with an exam. This will show that the cervix is dilating and getting thinner.  WHAT TO REMEMBER  Keep up with your usual exercises and follow other instructions given by your health care provider.   Take medicines as directed by your health care provider.   Keep your regular prenatal appointments.   Eat and drink lightly if you think you are going into labor.   If Braxton Hicks contractions are making you uncomfortable:   Change your position from lying down or resting to walking, or from walking to resting.   Sit and rest in a tub of warm water.   Drink 2-3 glasses of water. Dehydration may cause these contractions.   Do slow and deep breathing several times an hour.    WHEN SHOULD I SEEK IMMEDIATE MEDICAL CARE? Seek immediate medical care if:  Your contractions become stronger, more regular, and closer together.   You have fluid leaking or gushing from your vagina.   You have a fever.   You pass blood-tinged mucus.   You have vaginal bleeding.   You have continuous abdominal pain.   You have low back pain that you never had before.   You feel your baby's head pushing down and causing pelvic pressure.   Your baby is not moving as much as it used to.    This information is not intended to  replace advice given to you by your health care provider. Make sure you discuss any questions you have with your health care provider.   Document Released: 12/25/2004 Document Revised: 12/30/2012 Document Reviewed: 10/06/2012 Elsevier Interactive Patient Education 2016 Elsevier Inc.  

## 2015-04-07 LAB — URINE CULTURE

## 2015-04-12 ENCOUNTER — Inpatient Hospital Stay (HOSPITAL_COMMUNITY)
Admission: AD | Admit: 2015-04-12 | Discharge: 2015-04-14 | DRG: 775 | Disposition: A | Discharge status: Home / Self Care | Payer: Medicaid Other | Source: Ambulatory Visit | Attending: Obstetrics & Gynecology | Admitting: Obstetrics & Gynecology

## 2015-04-12 ENCOUNTER — Encounter (HOSPITAL_COMMUNITY): Payer: Self-pay

## 2015-04-12 ENCOUNTER — Ambulatory Visit (INDEPENDENT_AMBULATORY_CARE_PROVIDER_SITE_OTHER): Payer: Medicaid Other | Admitting: Advanced Practice Midwife

## 2015-04-12 VITALS — BP 140/76 | HR 88 | Wt 273.0 lb

## 2015-04-12 DIAGNOSIS — Z331 Pregnant state, incidental: Secondary | ICD-10-CM

## 2015-04-12 DIAGNOSIS — Z1389 Encounter for screening for other disorder: Secondary | ICD-10-CM

## 2015-04-12 DIAGNOSIS — O0943 Supervision of pregnancy with grand multiparity, third trimester: Secondary | ICD-10-CM

## 2015-04-12 DIAGNOSIS — Z3483 Encounter for supervision of other normal pregnancy, third trimester: Secondary | ICD-10-CM

## 2015-04-12 DIAGNOSIS — Z9049 Acquired absence of other specified parts of digestive tract: Secondary | ICD-10-CM | POA: Diagnosis not present

## 2015-04-12 DIAGNOSIS — IMO0001 Reserved for inherently not codable concepts without codable children: Secondary | ICD-10-CM

## 2015-04-12 DIAGNOSIS — Z3A38 38 weeks gestation of pregnancy: Secondary | ICD-10-CM

## 2015-04-12 DIAGNOSIS — Z888 Allergy status to other drugs, medicaments and biological substances status: Secondary | ICD-10-CM

## 2015-04-12 DIAGNOSIS — O0933 Supervision of pregnancy with insufficient antenatal care, third trimester: Secondary | ICD-10-CM | POA: Diagnosis not present

## 2015-04-12 DIAGNOSIS — Z3493 Encounter for supervision of normal pregnancy, unspecified, third trimester: Secondary | ICD-10-CM

## 2015-04-12 DIAGNOSIS — O9952 Diseases of the respiratory system complicating childbirth: Principal | ICD-10-CM | POA: Diagnosis present

## 2015-04-12 DIAGNOSIS — O99334 Smoking (tobacco) complicating childbirth: Secondary | ICD-10-CM | POA: Diagnosis present

## 2015-04-12 DIAGNOSIS — Z3A39 39 weeks gestation of pregnancy: Secondary | ICD-10-CM

## 2015-04-12 DIAGNOSIS — J45909 Unspecified asthma, uncomplicated: Secondary | ICD-10-CM | POA: Diagnosis present

## 2015-04-12 DIAGNOSIS — F17210 Nicotine dependence, cigarettes, uncomplicated: Secondary | ICD-10-CM | POA: Diagnosis not present

## 2015-04-12 LAB — CBC
HCT: 37.4 % (ref 36.0–46.0)
Hemoglobin: 12.7 g/dL (ref 12.0–15.0)
MCH: 30.9 pg (ref 26.0–34.0)
MCHC: 34 g/dL (ref 30.0–36.0)
MCV: 91 fL (ref 78.0–100.0)
PLATELETS: 278 10*3/uL (ref 150–400)
RBC: 4.11 MIL/uL (ref 3.87–5.11)
RDW: 13.8 % (ref 11.5–15.5)
WBC: 15.2 10*3/uL — AB (ref 4.0–10.5)

## 2015-04-12 LAB — POCT URINALYSIS DIPSTICK
GLUCOSE UA: NEGATIVE
Ketones, UA: NEGATIVE
Leukocytes, UA: NEGATIVE
NITRITE UA: NEGATIVE
Protein, UA: NEGATIVE
RBC UA: NEGATIVE

## 2015-04-12 MED ORDER — MISOPROSTOL 200 MCG PO TABS
ORAL_TABLET | ORAL | Status: AC
  Administered 2015-04-12: 800 ug
  Filled 2015-04-12: qty 4

## 2015-04-12 MED ORDER — OXYTOCIN 10 UNIT/ML IJ SOLN
INTRAMUSCULAR | Status: AC
  Administered 2015-04-12: 10 [IU]
  Filled 2015-04-12: qty 1

## 2015-04-12 MED ORDER — OXYTOCIN BOLUS FROM INFUSION: 500.0000 mL | INTRAVENOUS | Status: DC

## 2015-04-12 MED ORDER — LIDOCAINE HCL (PF) 1 % IJ SOLN
30.0000 mL | INTRAMUSCULAR | Status: DC | PRN
Start: 2015-04-12 — End: 2015-04-13
  Filled 2015-04-12: qty 30

## 2015-04-12 MED ORDER — ACETAMINOPHEN 325 MG PO TABS: 650.0000 mg | ORAL_TABLET | ORAL | Status: DC | PRN

## 2015-04-12 MED ORDER — CITRIC ACID-SODIUM CITRATE 334-500 MG/5ML PO SOLN
30.0000 mL | ORAL | Status: DC | PRN
Start: 2015-04-12 — End: 2015-04-13

## 2015-04-12 MED ORDER — OXYCODONE-ACETAMINOPHEN 5-325 MG PO TABS: 2.0000 | ORAL_TABLET | ORAL | Status: DC | PRN

## 2015-04-12 MED ORDER — LACTATED RINGERS IV SOLN
500.0000 mL | INTRAVENOUS | Status: DC | PRN
Start: 2015-04-12 — End: 2015-04-13

## 2015-04-12 MED ORDER — OXYCODONE-ACETAMINOPHEN 5-325 MG PO TABS
1.0000 | ORAL_TABLET | ORAL | Status: DC | PRN
  Administered 2015-04-12: 1 via ORAL
  Filled 2015-04-12: qty 1

## 2015-04-12 MED ORDER — ALBUTEROL SULFATE (2.5 MG/3ML) 0.083% IN NEBU: 3.0000 mL | INHALATION_SOLUTION | Freq: Four times a day (QID) | RESPIRATORY_TRACT | Status: DC | PRN

## 2015-04-12 MED ORDER — LIDOCAINE HCL (PF) 1 % IJ SOLN
INTRAMUSCULAR | Status: AC
  Filled 2015-04-12: qty 30

## 2015-04-12 MED ORDER — LACTATED RINGERS IV SOLN: INTRAVENOUS | Status: DC

## 2015-04-12 MED ORDER — FLEET ENEMA 7-19 GM/118ML RE ENEM: 1.0000 | ENEMA | RECTAL | Status: DC | PRN

## 2015-04-12 MED ORDER — ONDANSETRON HCL 4 MG/2ML IJ SOLN: 4.0000 mg | Freq: Four times a day (QID) | INTRAMUSCULAR | Status: DC | PRN

## 2015-04-12 MED ORDER — OXYTOCIN 10 UNIT/ML IJ SOLN: 2.5000 [IU]/h | INTRAVENOUS | Status: DC

## 2015-04-12 NOTE — MAU Note (Signed)
Pt states contractions started at 850pm and are every 10 mins. Has some bloody discharge-was checked in office today and was 5.5cm. Denies LOF. +FM

## 2015-04-12 NOTE — MAU Note (Signed)
Pt SROM at 2247 during transport to birthing suites room 176

## 2015-04-12 NOTE — Progress Notes (Signed)
Pt denies any problems or concerns at this time.  

## 2015-04-12 NOTE — H&P (Signed)
LABOR ADMISSION HISTORY AND PHYSICAL  [name] is a 30 y.o. female [phone] with IUP at 4455w4d by early ultrasound presenting for active labor. She reports +FMs.  Denies LOF, VB, blurry vision, headaches, peripheral edema, RUQ pain.  She plans on breast feeding. She request BTL for birth control.  Plans on bringing child to [facility] for pediatric care after discharge.   Dating: By early US --->  Estimated Date of Delivery: 04/22/15  Sono:    @[redacted]w[redacted]d , normal anatomy, cephalic presentation,  3553g, 87% EFW   Prenatal History/Complications:  [facility]  Initiated Care at  33 weeks  [facility]  Dating By Early US  Pap Normal 8/16  GC/CT Initial:   -/-             36+wks:  Genetic Screen NT/IT: NT normal, didn't do 2nd part  CF screen declined  Anatomic US normal  Flu vaccine declined  Tdap Recommended ~ 28wks  Glucose Screen  1 hour  98  GBS neg  Feed Preference breast  Contraception BTL, consent signed 04/05/2015   Circumcision declines  Childbirth Classes declined  Pediatrician [facility]    Past Medical History: Past Medical History  Diagnosis Date  . Asthma     Past Surgical History: Past Surgical History  Procedure Laterality Date  . Left arm surgery    . Cholecystectomy  2009    Obstetrical History: OB History    Gravida Para Term Preterm AB TAB SAB Ectopic Multiple Living   5 4 4       4       Social History: Social History   Social History  . Marital Status: Single    Spouse Name: N/A  . Number of Children: N/A  . Years of Education: N/A   Social History Main Topics  . Smoking status: Current Some Day Smoker -- 0.25 packs/day    Types: Cigarettes    Start date: 02/21/2003  . Smokeless tobacco: Never Used  . Alcohol Use: No  . Drug Use: No  . Sexual Activity: Yes   Other Topics Concern  . None   Social History Narrative    Family History: History reviewed. No pertinent family  history.  Allergies: Allergies  Allergen Reactions  . Aspirin     rash    Prescriptions prior to admission  Medication Sig Dispense Refill Last Dose  . Prenatal Vit-Fe Fumarate-FA (PRENATAL VITAMINS PLUS) 27-1 MG TABS Take 1 tablet by mouth daily.   04/12/2015 at Unknown time  . albuterol (PROVENTIL HFA;VENTOLIN HFA) 108 (90 BASE) MCG/ACT inhaler Inhale 2 puffs into the lungs every 6 (six) hours as needed for wheezing. 1 Inhaler 11 Taking     Review of Systems   All systems reviewed and negative except as stated in HPI  Blood pressure 140/70, pulse 90, temperature 97.7 F (36.5 C), temperature source Oral, resp. rate 20, last menstrual period 07/10/2014. General appearance: alert, appears stated age, moderate distress and having contractions Lungs: slightly increased WOB during contractions, otherwise normal Heart: regular rate Abdomen: gravid Pelvic: Dilation: 7.5 Effacement (%): 90 Station: -1 Presentation: Vertex Exam by:: Gina Enganielle Simpson RN Extremities: WWP, no edema Neuro: follows commands Presentation: cephalic Fetal monitoringBaseline: 150 bpm, Variability: Good {> 6 bpm) and Accelerations: Reactive Uterine activityFrequency: Every 2 minutes   Prenatal labs: ABO, Rh: O/Positive/-- (03/01 1150) Antibody: Negative (03/01 1150) Rubella: !Error! RPR: Non Reactive (03/01 1150)  HBsAg: Negative (03/01 1150)  HIV: Non Reactive (03/01 1150)  GBS: Negative (  03/21 1400)  1 hr Glucola 98 Genetic screening NT normal, second part declined by mother Anatomy US right pyelectasis 9 mm  Prenatal Transfer Tool  Maternal Diabetes: No Genetic Screening: Normal Maternal Ultrasounds/Referrals: Abnormal:  Findings:   Fetal Kidney Anomalies Fetal Ultrasounds or other Referrals:  None Maternal Substance Abuse:  Yes:  Type: Smoker Significant Maternal Medications:  None Significant Maternal Lab Results: Lab values include: Group B Strep negative  Results for orders placed or  performed in visit on 04/12/15 (from the past 24 hour(s))  POCT urinalysis dipstick   Collection Time: 04/12/15 10:39 AM  Result Value Ref Range   Color, UA     Clarity, UA     Glucose, UA neg    Bilirubin, UA     Ketones, UA neg    Spec Grav, UA     Blood, UA neg    pH, UA     Protein, UA neg    Urobilinogen, UA     Nitrite, UA neg    Leukocytes, UA Negative Negative    Patient Active Problem List   Diagnosis Date Noted  . Pyelectasis of fetus on prenatal ultrasound 03/29/2015  . Uterine size date discrepancy 03/23/2015  . Late prenatal care 03/09/2015  . Supervision of normal pregnancy 03/09/2015    Assessment: [name] is a 30 y.o. G5P4004 at [redacted]w[redacted]d here for active labor  #Labor: precipitous delivery  #Pain: none #FWB: Cat 1 #ID:  GBS neg #MOF: Breast #MOC: BTL, papers signed 3/20 #Circ:  Declined  Delynn Flavin, DO 04/12/2015, 10:47 PM  OB fellow attestation: I have seen and examined this patient; I agree with above documentation in the resident's note.   [name] is a 30 y.o. [id number] here for active labor at term  PE: BP 117/66 mmHg  Pulse 69  Temp(Src) 97.5 F (36.4 C) (Oral)  Resp 20  Ht  (1.676 m)  Wt 273 lb (123.832 kg)  BMI 44.08 kg/m2  LMP 07/10/2014 (Approximate)  Breastfeeding? Unknown Gen: calm comfortable, NAD Resp: normal effort, no distress Abd: gravid  ROS, labs, PMH reviewed  Plan: Admit to LD Expectant management Desires BTL, not yet 30days, will need interval tubal.  GBS neg  Federico Flake, MD  Family Medicine, OB Fellow 04/13/2015, 1:37 AM

## 2015-04-12 NOTE — Progress Notes (Signed)
[id number] 1912w4d Estimated Date of Delivery: 04/22/15  Blood pressure 140/76, pulse 88, weight 273 lb (123.832 kg), last menstrual period 07/10/2014.   BP weight and urine results all reviewed and noted.  Please refer to the obstetrical flow sheet for the fundal height and fetal heart rate documentation: No contractions  Patient reports good fetal movement, denies any bleeding and no rupture of membranes symptoms or regular contractions. Patient is without complaints.No HA, RUQ pain, blurred vision.  All questions were answered.  Orders Placed This Encounter  Procedures  . POCT urinalysis dipstick    Plan:  Continued routine obstetrical care,   Return in about 3 days (around 04/15/2015) for LROB. BP check

## 2015-04-12 NOTE — Patient Instructions (Signed)

## 2015-04-13 LAB — CBC
HCT: 34.3 % — ABNORMAL LOW (ref 36.0–46.0)
HEMOGLOBIN: 11.8 g/dL — AB (ref 12.0–15.0)
MCH: 30.9 pg (ref 26.0–34.0)
MCHC: 34.4 g/dL (ref 30.0–36.0)
MCV: 89.8 fL (ref 78.0–100.0)
Platelets: 272 10*3/uL (ref 150–400)
RBC: 3.82 MIL/uL — AB (ref 3.87–5.11)
RDW: 13.6 % (ref 11.5–15.5)
WBC: 17.8 10*3/uL — ABNORMAL HIGH (ref 4.0–10.5)

## 2015-04-13 LAB — RPR: RPR: NONREACTIVE

## 2015-04-13 LAB — TYPE AND SCREEN
ABO/RH(D): O POS
Antibody Screen: NEGATIVE

## 2015-04-13 LAB — ABO/RH: ABO/RH(D): O POS

## 2015-04-13 MED ORDER — TETANUS-DIPHTH-ACELL PERTUSSIS 5-2.5-18.5 LF-MCG/0.5 IM SUSP
0.5000 mL | Freq: Once | INTRAMUSCULAR | Status: AC
  Administered 2015-04-13: 0.5 mL via INTRAMUSCULAR
  Filled 2015-04-13: qty 0.5

## 2015-04-13 MED ORDER — BENZOCAINE-MENTHOL 20-0.5 % EX AERO: 1.0000 "application " | INHALATION_SPRAY | CUTANEOUS | Status: DC | PRN

## 2015-04-13 MED ORDER — SENNOSIDES-DOCUSATE SODIUM 8.6-50 MG PO TABS
2.0000 | ORAL_TABLET | ORAL | Status: DC
  Administered 2015-04-14: 2 via ORAL
  Filled 2015-04-13: qty 2

## 2015-04-13 MED ORDER — NICOTINE 7 MG/24HR TD PT24
7.0000 mg | MEDICATED_PATCH | Freq: Every day | TRANSDERMAL | Status: DC
  Administered 2015-04-13: 7 mg via TRANSDERMAL
  Filled 2015-04-13 (×2): qty 1

## 2015-04-13 MED ORDER — SIMETHICONE 80 MG PO CHEW: 80.0000 mg | CHEWABLE_TABLET | ORAL | Status: DC | PRN

## 2015-04-13 MED ORDER — ACETAMINOPHEN 325 MG PO TABS: 650.0000 mg | ORAL_TABLET | ORAL | Status: DC | PRN

## 2015-04-13 MED ORDER — WITCH HAZEL-GLYCERIN EX PADS: 1.0000 "application " | MEDICATED_PAD | CUTANEOUS | Status: DC | PRN

## 2015-04-13 MED ORDER — OXYCODONE HCL 5 MG PO TABS
5.0000 mg | ORAL_TABLET | ORAL | Status: DC | PRN
  Administered 2015-04-13 – 2015-04-14 (×2): 5 mg via ORAL
  Filled 2015-04-13 (×2): qty 1

## 2015-04-13 MED ORDER — OXYCODONE HCL 5 MG PO TABS: 10.0000 mg | ORAL_TABLET | ORAL | Status: DC | PRN

## 2015-04-13 MED ORDER — ONDANSETRON HCL 4 MG/2ML IJ SOLN: 4.0000 mg | INTRAMUSCULAR | Status: DC | PRN

## 2015-04-13 MED ORDER — DIBUCAINE 1 % RE OINT: 1.0000 "application " | TOPICAL_OINTMENT | RECTAL | Status: DC | PRN

## 2015-04-13 MED ORDER — PRENATAL MULTIVITAMIN CH
1.0000 | ORAL_TABLET | Freq: Every day | ORAL | Status: DC
  Administered 2015-04-13: 1 via ORAL
  Filled 2015-04-13: qty 1

## 2015-04-13 MED ORDER — ZOLPIDEM TARTRATE 5 MG PO TABS: 5.0000 mg | ORAL_TABLET | Freq: Every evening | ORAL | Status: DC | PRN

## 2015-04-13 MED ORDER — ONDANSETRON HCL 4 MG PO TABS: 4.0000 mg | ORAL_TABLET | ORAL | Status: DC | PRN

## 2015-04-13 MED ORDER — IBUPROFEN 600 MG PO TABS
600.0000 mg | ORAL_TABLET | Freq: Four times a day (QID) | ORAL | Status: DC
  Administered 2015-04-13 – 2015-04-14 (×5): 600 mg via ORAL
  Filled 2015-04-13 (×5): qty 1

## 2015-04-13 MED ORDER — LANOLIN HYDROUS EX OINT: TOPICAL_OINTMENT | CUTANEOUS | Status: DC | PRN

## 2015-04-13 MED ORDER — DIPHENHYDRAMINE HCL 25 MG PO CAPS: 25.0000 mg | ORAL_CAPSULE | Freq: Four times a day (QID) | ORAL | Status: DC | PRN

## 2015-04-13 NOTE — Lactation Note (Signed)
This note was copied from a baby's chart. Lactation Consultation Note Mom decided she doesn't want to BF d/t painful latches. Mom didn't BF her other children. Mom has inverted nipples. Rt. Inverted nipple everts some w/inverted center. Breast are wide space and pendulum cone shape w/nipple at the end of breast turning inwards towards abd. Mom has to flip breast up and outwards to latch baby and hold breast during BF. Mom states had no breast changes during pregnancy. With her other children only got milk after delivery w/one child.mom states she is changing to formula. Discussed formula feeding and gave feeding instructions sheet. Discussed engorgement prevention. Patient Name: Boy Irma NewnessJacqueline Cooley [id number]'[id number]'s Date: 04/13/2015 Reason for consult: Initial assessment   Maternal Data Has patient been taught Hand Expression?: Yes Does the patient have breastfeeding experience prior to this delivery?: No  Feeding Feeding Type: Formula Nipple Type: Slow - flow Length of feed: 20 min  LATCH Score/Interventions Latch: Repeated attempts needed to sustain latch, nipple held in mouth throughout feeding, stimulation needed to elicit sucking reflex. Intervention(s): Adjust position;Assist with latch  Audible Swallowing: A few with stimulation Intervention(s): Skin to skin  Type of Nipple: Inverted  Comfort (Breast/Nipple): Soft / non-tender     Hold (Positioning): Assistance needed to correctly position infant at breast and maintain latch. Intervention(s): Breastfeeding basics reviewed;Support Pillows;Position options;Skin to skin  LATCH Score: 7  Lactation Tools Discussed/Used WIC Program: Yes   Consult Status Consult Status: Complete Date: 04/13/15    Charyl DancerCARVER, Denisha Hoel G 04/13/2015, 6:19 AM

## 2015-04-13 NOTE — Progress Notes (Signed)
Patient ID: [name], female   DOB: 21-Aug-1985, 30 y.o.   MRN: [id number]   POSTPARTUM PROGRESS NOTE  Post Partum Day 1 Subjective:  [name] is a 30 y.o. [id number] 5713w4d PPD#1 s/p SVD.  Pt has a hx of tobacco use during pregnancy; she had a precipitous delivery. No acute events overnight.  Pt denies problems with ambulating, voiding or po intake.  Pain is moderately controlled with ibuprofen.  She has not had bowel movement.  Lochia is minimal. Pt had a boy and declined circumcision. Planning on formula feeding due to pain/trouble with breastfeeding. She signed BTL papers on 04/05/15.  Objective: Blood pressure 128/67, pulse 78, temperature 98.2 F (36.8 C), temperature source Oral, resp. rate 18, height 5\' 6"  (1.676 m), weight 123.832 kg (273 lb), last menstrual period 07/10/2014, unknown if currently breastfeeding.  Physical Exam:  General: alert, cooperative and no distress Lochia: normal flow Chest: CTAB Heart: RRR no m/r/g Abdomen: soft, fundus firm DVT Evaluation: no calf swelling or tenderness   Recent Labs  04/12/15 2316 04/13/15 0506  HGB 12.7 11.8*  HCT 37.4 34.3*    Assessment/Plan:  ASSESSMENT: [name] is a 30 y.o. A6T0160G5P5005 6113w4d PPD#1 s/p SVD, doing well.   Plan for discharge tomorrow.   LOS: 1 day   Felton ClintonKali Rabecka Brendel 04/13/2015, 7:51 AM

## 2015-04-13 NOTE — Progress Notes (Signed)
CLINICAL SOCIAL WORK MATERNAL/CHILD NOTE  Patient Details  Name: [name] MRN: [id number] Date of Birth: 04/12/2014  Date:  04/13/2015  Clinical Social Worker Initiating Note:  Elissa Hefty, MSW intern  Date/ Time Initiated:  04/13/15/1030     Child's Name:  [name]    Legal Guardian:  [name] and [name]    Need for Interpreter:  None   Date of Referral:  04/12/15     Reason for Referral:  Late or No Prenatal Care- Initiated care at 33 weeks and 5 days per chart review. MOB reported she started her care early on during the pregnancy but missed a lot of appointments due to transportation issues. Per MOB, she completed her prenatal care at [facility]. ([address] [place], [place], [place] [address]).   Referral Source:  [facility]   Address:  [address] [place], [place] [address]  Phone number:  [phone]   Household Members:  Self, Minor Children, Significant Other   Natural Supports (not living in the home):  Children, Immediate Family, Spouse/significant other, Parent   Professional Supports: None   Employment: Unemployed   Type of Work:     Education:  Database administrator Resources:  Medicaid   Other Resources:  Physicist, medical , [place] Considerations Which May Impact Care:  None Reported   Strengths:  Ability to meet basic needs , Home prepared for child    Risk Factors/Current Problems: Late Prenatal Care- Per chart review, MOB started care at 33 weeks and 5 days. MOB reported she had transportation issues which led to her missing several appointments and having limited prenatal care.    Cognitive State:  Insightful , Linear Thinking , Able to Concentrate , Goal Oriented    Mood/Affect:  Happy , Interested , Comfortable , Relaxed    CSW Assessment:  MSW intern presented in patient's room due to a consult being placed by the central nursery because of late prenatal care. MOB was alone in the  room caring for the infant. MOB provided verbal consent for MSW intern to engage. MOB presented to be in a happy mood as evidence by her caring for the infant and smiling during the assessment. MOB disclosed she had four children ages, 20 , 13, 2, and 1. Per MOB, FOB is at home caring for the 73 and 29 year old. MOB voiced all of her children were excited to meet the infant and help her care for the infant. MSW intern asked MOB how she felt about caring for two infants once she got home. MOB expressed feelings of excitement and quoted, " I know it will be a lot, but I got this, and just plan to take it day by day." MOB shared the birthing process went well and denied having any pain or concerns. MOB stated she was sore and tired and was hoping to get some rest once FOB came back.  MOB stated she was recovering well into postpartum. MOB expressed she has a great support system and had met all of the infant's basic needs. 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 MOB reported FOB is employed as a Development worker, international aid and will be taking a few days off to help her transition back home. According to [facility], she receives [facility] and [employer] and is also on the waiting list for a day care voucher.   MSW intern asked MOB how  her mental health was during the pregnancy. MOB shared the pregnancy was "good" and denied having any concerns regarding her mental health. MOB denied any history of experiencing a perinatal mood disorder after her last four pregnancies. MSW intern inquired about MOB's mental health as a teenager. 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 MOB reported she engaged in cutting behaviors from ages [age]. MOB shared she was prescribed medications but was unsure of the name. MSW intern asked MOB if the medication name Zoloft sounded familiar and MOB voiced that she thinks that was the name of medication  prescribed to her. MOB denied attending therapy outside of the group sessions she had at [facility] during the week she was admitted inpatient. MSW intern asked MOB what she was experiencing during that time of her life that led to her behaviors and admission. MOB expressed her parents separated when she was 77 and it was a difficult adjustment for her. Per MOB, she was able to overcome her cutting behaviors and feelings of depression throughout the years and now has a good relationship with both parents. MOB was unable to tell MSW intern what helped her cope though her feelings of depression when she was a teenager. MOB expressed she simply "grew out of it" with time and realized she was just harming herself. MOB reported that having a good relationship with her parents helped her as well. MOB denied any suicide attempts, cutting behaviors, or feelings of depression since she was 30 years old.   MSW intern provided education on PMAD'S and handouts with further information for MOB to take home. MSW intern also informed MOB about the hospital's support group, Feelings After Birth. MOB denied having any furhter questions or concerns but agreed to contact her OB if needs arise. MOB thanked MSW intern for the education provided but assured her she had no concerns about her mental health going into postpartum.   MSW intern inquired about MOB's late prenatal care. MOB shared she initiated care early on during the pregnancy but missed many appointments due to transportation issues. MOB reported she was unaware of Medicaid transportation until she sought care at [facility]. MOB shared she completed her care with them. Per chart review, MOB initiated care at 33 weeks and 5 days. MSW intern informed MOB about the hospital's drug screening policy. MOB acknowledged the information given by MSW intern and denied any substance use during the pregnancy. MOB was understanding of the hospital's policy and denied having any  concerns.  MSW intern reminded MOB about the importance of sleep and self-care, MOB expressed she would be taking a nap once FOB arrived.   MOB thanked MSW intern for the information provided and agreed to contact her if needs arise.  CSW Plan/Description:   Engineer, mining- MSW intern provided education on perinatal mood disorders and the hospital's support group.  MSW intern to monitor UDS and cord tissue drug screens and file a Child Protective Service Report as needed. (UDS is negative and MOB was informed)  No Further Intervention Required/No Barriers to Discharge    Trevor Iha, Student-SW 04/13/2015, 11:13 AM

## 2015-04-13 NOTE — Progress Notes (Signed)
POSTPARTUM PROGRESS NOTE  Post Partum Day 1 Subjective:  [name] is a 30 y.o. [id number] 4613w4d s/p nsvd. No dizziness or palpitations.  No acute events overnight.  Pt denies problems with ambulating, voiding or po intake.  She denies nausea or vomiting.  Pain is well controlled.  She has had flatus. She has not had bowel movement.  Lochia Small.   Objective: Blood pressure 128/67, pulse 78, temperature 98.2 F (36.8 C), temperature source Oral, resp. rate 18, height 5\' 6"  (1.676 m), weight 273 lb (123.832 kg), last menstrual period 07/10/2014, unknown if currently breastfeeding.  Physical Exam:  General: alert, cooperative and no distress Lochia:normal flow Chest: CTAB Heart: RRR no m/r/g Abdomen: +BS, soft, nontender,  Uterine Fundus: firm,  DVT Evaluation: No calf swelling or tenderness Extremities: trace edema   Recent Labs  04/12/15 2316 04/13/15 0506  HGB 12.7 11.8*  HCT 37.4 34.3*    Assessment/Plan:  ASSESSMENT: [name] is a 30 y.o. [id number] 6813w4d s/p nsvd, doing well.   Plan for discharge tomorrow   LOS: 1 day   Silvano Bilisoah B Angelin Cutrone 04/13/2015, 10:55 AM

## 2015-04-13 NOTE — Progress Notes (Signed)
Charted wrong time

## 2015-04-14 MED ORDER — IBUPROFEN 600 MG PO TABS: 600.0000 mg | ORAL_TABLET | Freq: Four times a day (QID) | ORAL | Status: DC | PRN

## 2015-04-14 NOTE — Discharge Instructions (Signed)

## 2015-04-14 NOTE — Lactation Note (Signed)
This note was copied from a baby's chart. Lactation Consultation Note  Patient Name: [name] ZOXWR'UToday's Date: 04/14/2015 Reason for consult: Follow-up assessment Mom has changed to bottle feeding with formula. Discussed ways to dry her milk and engorgement care reviewed if needed. Advised Mom to increase volumes for baby, guidelines for formula feeding hand out given to Mom. Call for questions/concerns.   Maternal Data    Feeding    LATCH Score/Interventions                      Lactation Tools Discussed/Used     Consult Status Consult Status: Complete Date: 04/14/15 Follow-up type: In-patient    Alfred LevinsGranger, Reni Hausner Ann 04/14/2015, 8:44 AM

## 2015-04-14 NOTE — Discharge Summary (Signed)
OB Discharge Summary     Patient Name: Gina NewnessJacqueline Churchman DOB: Jan 14, 1985 MRN: [id number]  Date of admission: 04/12/2015 Delivering MD: Raliegh IpGOTTSCHALK, ASHLY M   Date of discharge: 04/14/2015  Admitting diagnosis: 39w ctx 10 min apart Intrauterine pregnancy: 6774w4d     Secondary diagnosis:  Active Problems:   SVD (spontaneous vaginal delivery)  Additional problems: none     Discharge diagnosis: Term Pregnancy Delivered                                                                                                Post partum procedures:none  Augmentation: none  Complications: None  Hospital course:  Onset of Labor With Vaginal Delivery     30 y.o. yo G5P5005 at 5174w4d was admitted in Active Labor on 04/12/2015. Patient had an uncomplicated labor course as follows:  Membrane Rupture Time/Date: 10:50 PM ,04/12/2015   Intrapartum Procedures: Episiotomy: None [1]                                         Lacerations:  None [1]  Patient had a delivery of a Viable infant. 04/12/2015  Information for the patient's newborn:  Sherlynn CarbonDavis, Kyler [[id number]][[id number]]  Delivery Method: Vaginal, Spontaneous Delivery (Filed from Delivery Summary)    Pateint had an uncomplicated postpartum course.  She is ambulating, tolerating a regular diet, passing flatus, and urinating well. Patient is discharged home in stable condition on 04/15/2015.    Physical exam  Filed Vitals:   04/13/15 0600 04/13/15 1430 04/13/15 1700 04/14/15 0606  BP: 128/67 134/73 124/49 114/49  Pulse: 78 74 69 53  Temp: 98.2 F (36.8 C)  98 F (36.7 C) 97.4 F (36.3 C)  TempSrc: Oral   Oral  Resp: 18 18 18 17   Height:      Weight:       General: alert and cooperative Lochia: appropriate Uterine Fundus: firm Incision: N/A DVT Evaluation: No evidence of DVT seen on physical exam. Labs: Lab Results  Component Value Date   WBC 17.8* 04/13/2015   HGB 11.8* 04/13/2015   HCT 34.3* 04/13/2015   MCV 89.8 04/13/2015   PLT 272 04/13/2015    CMP Latest Ref Rng 08/19/2012  Glucose 65 - 99 mg/dL 97  BUN 6 - 20 mg/dL 11  Creatinine 0.860.57 - 5.781.00 mg/dL 4.690.69  Sodium 629134 - 528144 mmol/L 139  Potassium 3.5 - 5.2 mmol/L 4.2  Chloride 97 - 108 mmol/L 102  CO2 18 - 29 mmol/L 20  Calcium 8.7 - 10.2 mg/dL 9.9  Total Protein 6.0 - 8.5 g/dL 7.2  Total Bilirubin 0.0 - 1.2 mg/dL 0.3  Alkaline Phos 39 - 117 IU/L 71  AST 0 - 40 IU/L 25  ALT 0 - 32 IU/L 39(H)    Discharge instruction: per After Visit Summary and "Baby and Me Booklet".  After visit meds:    Medication List    TAKE these medications        albuterol 108 (90 Base) MCG/ACT inhaler  Commonly  known as:  PROVENTIL HFA;VENTOLIN HFA  Inhale 2 puffs into the lungs every 6 (six) hours as needed for wheezing.     ibuprofen 600 MG tablet  Commonly known as:  ADVIL,MOTRIN  Take 1 tablet (600 mg total) by mouth every 6 (six) hours as needed.     PRENATAL VITAMINS PLUS 27-1 MG Tabs  Take 1 tablet by mouth daily.        Diet: routine diet  Activity: Advance as tolerated. Pelvic rest for 6 weeks.   Outpatient follow up:6 weeks Follow up Appt:No future appointments. Follow up Visit:No Follow-up on file.  Postpartum contraception: Tubal Ligation- interval; papers signed 04/05/15  Newborn Data: Live born female  Birth Weight: 9 lb 8 oz (4310 g) APGAR: 8, 9  Baby Feeding: Bottle Disposition:home with mother   04/14/2015 Cam Hai, CNM

## 2015-04-15 ENCOUNTER — Encounter: Payer: Medicaid Other | Admitting: Obstetrics & Gynecology

## 2015-04-26 ENCOUNTER — Telehealth: Payer: Self-pay | Admitting: *Deleted

## 2015-04-26 NOTE — Telephone Encounter (Signed)
Clydie BraunKaren a postpartum new born nurse called stating pt is complaining of UTI symptoms.  Pt delivered 04/12/15 and states did not have a cath after delivery and is having burning with urination and urinary urgency.  Informed Clydie BraunKaren that we will call patient and have her come in for OV.

## 2015-04-29 ENCOUNTER — Encounter: Payer: Self-pay | Admitting: Obstetrics & Gynecology

## 2015-04-29 ENCOUNTER — Ambulatory Visit (INDEPENDENT_AMBULATORY_CARE_PROVIDER_SITE_OTHER): Payer: Medicaid Other | Admitting: Obstetrics & Gynecology

## 2015-04-29 VITALS — BP 120/70 | HR 65 | Temp 99.0°F | Wt 247.0 lb

## 2015-04-29 DIAGNOSIS — N3090 Cystitis, unspecified without hematuria: Secondary | ICD-10-CM | POA: Diagnosis not present

## 2015-04-29 DIAGNOSIS — R319 Hematuria, unspecified: Secondary | ICD-10-CM

## 2015-04-29 LAB — POCT URINALYSIS DIPSTICK
Blood, UA: 3
Glucose, UA: NEGATIVE
KETONES UA: NEGATIVE
Nitrite, UA: NEGATIVE
PROTEIN UA: NEGATIVE

## 2015-04-29 MED ORDER — CEPHALEXIN 500 MG PO CAPS: 500.0000 mg | ORAL_CAPSULE | Freq: Three times a day (TID) | ORAL | Status: DC

## 2015-04-29 NOTE — Progress Notes (Signed)
Patient ID: [name], female   DOB: 09-14-1985, 30 y.o.   MRN: [id number] History of Present Illness   Patient Identification [name] is a 30 y.o. female.  Patient information was obtained from patient. History/Exam limitations: none. Patient presented to our office by private vehicle.  Chief Complaint  gyn visit   Patient presents with complaint of urinary frequency, urinary hesitancy and flank pain on left. She has had occasional past episodes of UTI. This patient is not sexually active. Onset of symptoms was gradual starting 5 days ago and has been gradually worsening since that time. The patient denies been seen by another provider for this illness. Care prior to arrival consisted of nothing, with  relief.  Past Medical History  Diagnosis Date  . Asthma    History reviewed. No pertinent family history. Scheduled Meds: Continuous Infusions: PRN Meds:  Allergies  Allergen Reactions  . Aspirin     rash   Social History   Social History  . Marital Status: Single    Spouse Name: N/A  . Number of Children: N/A  . Years of Education: N/A   Occupational History  . Not on file.   Social History Main Topics  . Smoking status: Current Some Day Smoker -- 0.25 packs/day    Types: Cigarettes    Start date: 02/21/2003  . Smokeless tobacco: Never Used  . Alcohol Use: No  . Drug Use: No  . Sexual Activity: Yes   Other Topics Concern  . Not on file   Social History Narrative   Review of Systems Pertinent items are noted in HPI.   Physical Exam   BP 120/70 mmHg  Pulse 65  Temp(Src) 99 F (37.2 C)  Wt 247 lb (112.038 kg)  LMP 07/10/2014 (Approximate) back no CVAT  ED Course   Studies: Lab: Urinalysis - abnormal  Records Reviewed: Old medical records.  Treatments: keflex 500 TID x 7 days prescribed, breast feeding  Consultations:   Disposition: Home take antibiotics

## 2015-04-30 LAB — URINE CULTURE: ORGANISM ID, BACTERIA: NO GROWTH

## 2015-05-24 ENCOUNTER — Ambulatory Visit (INDEPENDENT_AMBULATORY_CARE_PROVIDER_SITE_OTHER): Payer: Medicaid Other | Admitting: Advanced Practice Midwife

## 2015-05-24 ENCOUNTER — Encounter: Payer: Self-pay | Admitting: Advanced Practice Midwife

## 2015-05-24 NOTE — Progress Notes (Signed)
  Gina NewnessJacqueline Dutson is a 30 y.o. who presents for a postpartum visit. She is 6 weeks postpartum following a spontaneous vaginal delivery. I have fully reviewed the prenatal and intrapartum course. The delivery was at 38.4  gestational weeks.  Anesthesia: none. Postpartum course has been complicated by a UTI, treated w/o sequela. Baby's course has been uneventful. Baby is feeding by breast. Bleeding: no bleeding. Bowel function is normal. Bladder function is normal. Patient is not sexually active. Contraception method is none. Postpartum depression screening: negative.   Current outpatient prescriptions:  .  albuterol (PROVENTIL HFA;VENTOLIN HFA) 108 (90 BASE) MCG/ACT inhaler, Inhale 2 puffs into the lungs every 6 (six) hours as needed for wheezing., Disp: 1 Inhaler, Rfl: 11 .  ibuprofen (ADVIL,MOTRIN) 600 MG tablet, Take 1 tablet (600 mg total) by mouth every 6 (six) hours as needed., Disp: 30 tablet, Rfl: 0 .  Prenatal Vit-Fe Fumarate-FA (PRENATAL VITAMINS PLUS) 27-1 MG TABS, Take 1 tablet by mouth daily., Disp: , Rfl:   Review of Systems   Constitutional: Negative for fever and chills Eyes: Negative for visual disturbances Respiratory: Negative for shortness of breath, dyspnea Cardiovascular: Negative for chest pain or palpitations  Gastrointestinal: Negative for vomiting, diarrhea and constipation Genitourinary: Negative for dysuria and urgency Musculoskeletal: Negative for back pain, joint pain, myalgias  Neurological: Negative for dizziness and headaches   Objective:     Filed Vitals:   05/24/15 1015  BP: 120/74   General:  alert, cooperative and no distress   Breasts:  negative  Lungs: clear to auscultation bilaterally  Heart:  regular rate and rhythm  Abdomen: Soft, nontender   Vulva:  normal  Vagina: normal vagina  Cervix:  closed  Corpus: Well involuted     Rectal Exam: no hemorrhoids        Assessment:    normal postpartum exam.  Plan:    1. Contraception:  plans BTL. papers signed 04/05/15 2. Follow up in: ASAP for BTL preop  or as needed.

## 2015-05-25 ENCOUNTER — Encounter: Payer: Self-pay | Admitting: Obstetrics and Gynecology

## 2015-05-25 ENCOUNTER — Ambulatory Visit (INDEPENDENT_AMBULATORY_CARE_PROVIDER_SITE_OTHER): Payer: Medicaid Other | Admitting: Obstetrics and Gynecology

## 2015-05-25 ENCOUNTER — Other Ambulatory Visit: Payer: Self-pay | Admitting: Obstetrics and Gynecology

## 2015-05-25 VITALS — BP 120/72 | Ht 66.0 in | Wt 253.0 lb

## 2015-05-25 DIAGNOSIS — Z302 Encounter for sterilization: Secondary | ICD-10-CM

## 2015-05-25 DIAGNOSIS — N920 Excessive and frequent menstruation with regular cycle: Secondary | ICD-10-CM | POA: Diagnosis not present

## 2015-05-25 DIAGNOSIS — Z3009 Encounter for other general counseling and advice on contraception: Secondary | ICD-10-CM | POA: Diagnosis not present

## 2015-05-25 NOTE — Progress Notes (Signed)
Patient ID: [name], female   DOB: Jun 01, 1985, 30 y.o.   MRN: [id number]  Preoperative History and Physical  Gina NewnessJacqueline Brizuela is a 30 y.o. [id number] here for surgical management of permanent sterilization and menorrhagia   No significant preoperative concerns. Patient complains of painful, heavy periods. She notes 7 days of bleeding.  Patient is 6 weeks postpartum. She is abstinent. She is breast-feeding. She describes periods as undesirable heavy tubal sterilization is discussed with surgical technique risks to adjacent organs, potential complications. Additionally we discussed her heavy menses and discussed the options of IUD versus endometrial ablation. Patient is desirous of the more definitive and long-lasting endometrial ablation to be performed at time of tubal ligation  Proposed surgery: Bilateral tubal sterilization by bilateral salpingectomy and endometrial ablation  Past Medical History  Diagnosis Date  . Asthma    Past Surgical History  Procedure Laterality Date  . Left arm surgery    . Cholecystectomy  2009   OB History  Gravida Para Term Preterm AB SAB TAB Ectopic Multiple Living  5 5 5       0 5    # Outcome Date GA Lbr Len/2nd Weight Sex Delivery Anes PTL Lv  5 Term 04/12/15 [redacted]w[redacted]d 02:02 / 00:14 9 lb 8 oz (4.31 kg) M Vag-Spont None  Y  4 Term 06/17/13 4111w0d  8 lb (3.629 kg) M Vag-Breech None N Y  3 Term 05/30/12 5211w0d  7 lb 3 oz (3.26 kg) F Vag-Spont EPI N Y  2 Term 01/26/09 6311w0d  8 lb 3 oz (3.714 kg) M Vag-Spont None N Y  1 Term 02/12/07 7411w0d  8 lb 1 oz (3.657 kg) M Vag-Spont EPI N Y    Patient denies any other pertinent gynecologic issues.   Current Outpatient Prescriptions on File Prior to Visit  Medication Sig Dispense Refill  . albuterol (PROVENTIL HFA;VENTOLIN HFA) 108 (90 BASE) MCG/ACT inhaler Inhale 2 puffs into the lungs every 6 (six) hours as needed for wheezing. 1 Inhaler 11  . ibuprofen (ADVIL,MOTRIN) 600 MG tablet Take 1 tablet (600 mg  total) by mouth every 6 (six) hours as needed. 30 tablet 0  . Prenatal Vit-Fe Fumarate-FA (PRENATAL VITAMINS PLUS) 27-1 MG TABS Take 1 tablet by mouth daily.     No current facility-administered medications on file prior to visit.   Allergies  Allergen Reactions  . Aspirin     rash    Social History:   reports that she has been smoking Cigarettes.  She started smoking about 12 years ago. She has been smoking about 0.25 packs per day. She has never used smokeless tobacco. She reports that she does not drink alcohol or use illicit drugs.  No family history on file.  Review of Systems: Noncontributory  PHYSICAL EXAM: Performed 05/24/2015 currently breastfeeding. General appearance - alert, well appearing, and in no distress Chest - clear to auscultation, no wheezes, rales or rhonchi, symmetric air entry Heart - normal rate and regular rhythm Abdomen - soft, nontender, nondistended, no masses or organomegaly Pelvic exam: normal external  VULVA: normal appearing vulva with no masses, tenderness or lesions,  VAGINA: normal appearing vagina with normal color and discharge, no lesions,  CERVIX: normal appearing cervix, closed Extremities - peripheral pulses normal, no pedal edema, no clubbing or cyanosis Labs: No results found for this or any previous visit (from the past 336 hour(s)). CBC Latest Ref Rng 04/13/2015 04/12/2015 03/09/2015  WBC 4.0 - 10.5 K/uL 17.8(H) 15.2(H) 8.5  Hemoglobin 12.0 - 15.0 g/dL  11.8(L) 12.7 -  Hematocrit 36.0 - 46.0 % 34.3(L) 37.4 36.2  Platelets 150 - 400 K/uL 272 278 278     Imaging Studies: No results found.  Assessment: Desire for permanent sterilization 2. Heavy menses for endometrial ablation Patient Active Problem List   Diagnosis Date Noted  . SVD (spontaneous vaginal delivery) 04/12/2015  . Pyelectasis of fetus on prenatal ultrasound 03/29/2015  . Uterine size date discrepancy 03/23/2015  . Late prenatal care 03/09/2015  . Supervision of normal  pregnancy 03/09/2015    Plan: Patient will undergo surgical management with bilateral tubal Sterilization by bilateral salpingectomy and hysteroscopy D&C endometrial ablation.    .mec 05/25/2015 2:48 PM   By signing my name below, I, Ronney Lion, attest that this documentation has been prepared under the direction and in the presence of [name], MD. Electronically Signed: Ronney Lion, ED Scribe. 05/25/2015. 3:05 PM.  I personally performed the services described in this documentation, which was SCRIBED in my presence. The recorded information has been reviewed and considered accurate. It has been edited as necessary during review. [name], MD

## 2015-05-26 NOTE — Patient Instructions (Signed)
[name]  05/26/2015     @   Your procedure is scheduled on  05/31/2015  Report to [facility] at  615  A.M.  Call this number if you have problems the morning of surgery:  [phone]   Remember:  Do not eat food or drink liquids after midnight.  Take these medicines the morning of surgery with A SIP OF WATER  Take your inhaler before you come and bring it with you.   Do not wear jewelry, make-up or nail polish.  Do not wear lotions, powders, or perfumes.  You may wear deodorant.  Do not shave 48 hours prior to surgery.  Men may shave face and neck.  Do not bring valuables to the hospital.  [facility] is not responsible for any belongings or valuables.  Contacts, dentures or bridgework may not be worn into surgery.  Leave your suitcase in the car.  After surgery it may be brought to your room.  For patients admitted to the hospital, discharge time will be determined by your treatment team.  Patients discharged the day of surgery will not be allowed to drive home.   Name and phone number of your driver:   family Special instructions:  none  Please read over the following fact sheets that you were given. Coughing and Deep Breathing, Surgical Site Infection Prevention, Anesthesia Post-op Instructions and Care and Recovery After Surgery      Endometrial Ablation Endometrial ablation removes the lining of the uterus (endometrium). It is usually a same-day, outpatient treatment. Ablation helps avoid major surgery, such as surgery to remove the cervix and uterus (hysterectomy). After endometrial ablation, you will have little or no menstrual bleeding and may not be able to have children. However, if you are premenopausal, you will need to use a reliable method of birth control following the procedure because of the small chance that pregnancy can occur. There are different reasons to have this procedure. These reasons  include:  Heavy periods.  Bleeding that is causing anemia.  Irregular bleeding.  Bleeding fibroids on the lining inside the uterus if they are smaller than 3 centimeters. This procedure may not be possible for you if:   You want to have children in the future.   You have severe cramps with your menstrual period.   You have precancerous or cancerous cells in your uterus.   You were recently pregnant.   You have gone through menopause.   You have had major surgery on your uterus, resulting in thinning of the uterine wall. Surgeries may include:  The removal of one or more uterine fibroids (myomectomy).  A cesarean section with a classic (vertical) incision on your uterus. Ask your health care provider what type of cesarean you had. Sometimes the scar on your skin is different than the scar on your uterus. Even if you have had surgery on your uterus, certain types of ablation may still be safe for you. Talk with your health care provider. LET [facility] CARE PROVIDER KNOW ABOUT:  Any allergies you have.  All medicines you are taking, including vitamins, herbs, eye drops, creams, and over-the-counter medicines.  Previous problems you or members of your family have had with the use of anesthetics.  Any blood disorders you have.  Previous surgeries you have had.  Medical conditions you have. RISKS AND COMPLICATIONS  Generally, this is a safe procedure. However,  as with any procedure, complications can occur. Possible complications include:  Perforation of the uterus.  Bleeding.  Infection of the uterus, bladder, or vagina.  Injury to surrounding organs.  An air bubble to the lung (air embolus).  Pregnancy following the procedure.  Failure of the procedure to help the problem, requiring hysterectomy.  Decreased ability to diagnose cancer in the lining of the uterus. BEFORE THE PROCEDURE  The lining of the uterus must be tested to make sure there is no  pre-cancerous or cancer cells present.  An ultrasound may be performed to look at the size of the uterus and to check for abnormalities.  Medicines may be given to thin the lining of the uterus. PROCEDURE  During the procedure, your health care provider will use a tool called a resectoscope to help see inside your uterus. There are different ways to remove the lining of your uterus.   Radiofrequency - This method uses a radiofrequency-alternating electric current to remove the lining of the uterus.  Cryotherapy - This method uses extreme cold to freeze the lining of the uterus.  Heated-Free Liquid - This method uses heated salt (saline) solution to remove the lining of the uterus.  Microwave - This method uses high-energy microwaves to heat up the lining of the uterus to remove it.  Thermal balloon - This method involves inserting a catheter with a balloon tip into the uterus. The balloon tip is filled with heated fluid to remove the lining of the uterus. AFTER THE PROCEDURE  After your procedure, do not have sexual intercourse or insert anything into your vagina until permitted by your health care provider. After the procedure, you may experience:  Cramps.  Vaginal discharge.  Frequent urination.   This information is not intended to replace advice given to you by your health care provider. Make sure you discuss any questions you have with your health care provider.   Document Released: 11/04/2003 Document Revised: 09/15/2014 Document Reviewed: 05/28/2012 Elsevier Interactive Patient Education [employer] Hysteroscopy Hysteroscopy is a procedure used for looking inside the womb (uterus). It may be done for various reasons, including:  To evaluate abnormal bleeding, fibroid (benign, noncancerous) tumors, polyps, scar tissue (adhesions), and possibly cancer of the uterus.  To look for lumps (tumors) and other uterine growths.  To look for causes of why a woman cannot get  pregnant (infertility), causes of recurrent loss of pregnancy (miscarriages), or a lost intrauterine device (IUD).  To perform a sterilization by blocking the fallopian tubes from inside the uterus. In this procedure, a thin, flexible tube with a tiny light and camera on the end of it (hysteroscope) is used to look inside the uterus. A hysteroscopy should be done right after a menstrual period to be sure you are not pregnant. LET [facility] CARE PROVIDER KNOW ABOUT:   Any allergies you have.  All medicines you are taking, including vitamins, herbs, eye drops, creams, and over-the-counter medicines.  Previous problems you or members of your family have had with the use of anesthetics.  Any blood disorders you have.  Previous surgeries you have had.  Medical conditions you have. RISKS AND COMPLICATIONS  Generally, this is a safe procedure. However, as with any procedure, complications can occur. Possible complications include:  Putting a hole in the uterus.  Excessive bleeding.  Infection.  Damage to the cervix.  Injury to other organs.  Allergic reaction to medicines.  Too much fluid used in the uterus for the procedure. BEFORE THE  PROCEDURE   Ask your health care provider about changing or stopping any regular medicines.  Do not take aspirin or blood thinners for 1 week before the procedure, or as directed by your health care provider. These can cause bleeding.  If you smoke, do not smoke for 2 weeks before the procedure.  In some cases, a medicine is placed in the cervix the day before the procedure. This medicine makes the cervix have a larger opening (dilate). This makes it easier for the instrument to be inserted into the uterus during the procedure.  Do not eat or drink anything for at least 8 hours before the surgery.  Arrange for someone to take you home after the procedure. PROCEDURE   You may be given a medicine to relax you (sedative). You may also be given  one of the following:  A medicine that numbs the area around the cervix (local anesthetic).  A medicine that makes you sleep through the procedure (general anesthetic).  The hysteroscope is inserted through the vagina into the uterus. The camera on the hysteroscope sends a picture to a TV screen. This gives the surgeon a good view inside the uterus.  During the procedure, air or a liquid is put into the uterus, which allows the surgeon to see better.  Sometimes, tissue is gently scraped from inside the uterus. These tissue samples are sent to a lab for testing. AFTER THE PROCEDURE   If you had a general anesthetic, you may be groggy for a couple hours after the procedure.  If you had a local anesthetic, you will be able to go home as soon as you are stable and feel ready.  You may have some cramping. This normally lasts for a couple days.  You may have bleeding, which varies from light spotting for a few days to menstrual-like bleeding for 3-7 days. This is normal.  If your test results are not back during the visit, make an appointment with your health care provider to find out the results.   This information is not intended to replace advice given to you by your health care provider. Make sure you discuss any questions you have with your health care provider.   Document Released: 04/02/2000 Document Revised: 10/15/2012 Document Reviewed: 07/24/2012 Elsevier Interactive Patient Education 2016 [employer] Hysteroscopy, Care After Refer to this sheet in the next few weeks. These instructions provide you with information on caring for yourself after your procedure. Your health care provider may also give you more specific instructions. Your treatment has been planned according to current medical practices, but problems sometimes occur. Call your health care provider if you have any problems or questions after your procedure.  WHAT TO EXPECT AFTER THE PROCEDURE After your procedure, it  is typical to have the following:  You may have some cramping. This normally lasts for a couple days.  You may have bleeding. This can vary from light spotting for a few days to menstrual-like bleeding for 3-7 days. HOME CARE INSTRUCTIONS  Rest for the first 1-2 days after the procedure.  Only take over-the-counter or prescription medicines as directed by your health care provider. Do not take aspirin. It can increase the chances of bleeding.  Take showers instead of baths for 2 weeks or as directed by your health care provider.  Do not drive for 24 hours or as directed.  Do not drink alcohol while taking pain medicine.  Do not use tampons, douche, or have sexual intercourse for 2 weeks  or until your health care provider says it is okay.  Take your temperature twice a day for 4-5 days. Write it down each time.  Follow your health care provider's advice about diet, exercise, and lifting.  If you develop constipation, you may:  Take a mild laxative if your health care provider approves.  Add bran foods to your diet.  Drink enough fluids to keep your urine clear or pale yellow.  Try to have someone with you or available to you for the first 24-48 hours, especially if you were given a general anesthetic.  Follow up with your health care provider as directed. SEEK MEDICAL CARE IF:  You feel dizzy or lightheaded.  You feel sick to your stomach (nauseous).  You have abnormal vaginal discharge.  You have a rash.  You have pain that is not controlled with medicine. SEEK IMMEDIATE MEDICAL CARE IF:  You have bleeding that is heavier than a normal menstrual period.  You have a fever.  You have increasing cramps or pain, not controlled with medicine.  You have new belly (abdominal) pain.  You pass out.  You have pain in the tops of your shoulders (shoulder strap areas).  You have shortness of breath.   This information is not intended to replace advice given to you by  your health care provider. Make sure you discuss any questions you have with your health care provider.   Document Released: 10/15/2012 Document Reviewed: 10/15/2012 Elsevier Interactive Patient Education 2016 Elsevier Inc. Dilation and Curettage or Vacuum Curettage Dilation and curettage (D&C) and vacuum curettage are minor procedures. A D&C involves stretching (dilation) the cervix and scraping (curettage) the inside lining of the womb (uterus). During a D&C, tissue is gently scraped from the inside lining of the uterus. During a vacuum curettage, the lining and tissue in the uterus are removed with the use of gentle suction.  Curettage may be performed to either diagnose or treat a problem. As a diagnostic procedure, curettage is performed to examine tissues from the uterus. A diagnostic curettage may be performed for the following symptoms:   Irregular bleeding in the uterus.   Bleeding with the development of clots.   Spotting between menstrual periods.   Prolonged menstrual periods.   Bleeding after menopause.   No menstrual period (amenorrhea).   A change in size and shape of the uterus.  As a treatment procedure, curettage may be performed for the following reasons:   Removal of an IUD (intrauterine device).   Removal of retained placenta after giving birth. Retained placenta can cause an infection or bleeding severe enough to require transfusions.   Abortion.   Miscarriage.   Removal of polyps inside the uterus.   Removal of uncommon types of noncancerous lumps (fibroids).  [facility] KNOW ABOUT:   Any allergies you have.   All medicines you are taking, including vitamins, herbs, eye drops, creams, and over-the-counter medicines.   Previous problems you or members of your family have had with the use of anesthetics.   Any blood disorders you have.   Previous surgeries you have had.   Medical conditions you have. RISKS AND  COMPLICATIONS  Generally, this is a safe procedure. However, as with any procedure, complications can occur. Possible complications include:  Excessive bleeding.   Infection of the uterus.   Damage to the cervix.   Development of scar tissue (adhesions) inside the uterus, later causing abnormal amounts of menstrual bleeding.   Complications from the general  anesthetic, if a general anesthetic is used.   Putting a hole (perforation) in the uterus. This is rare.  BEFORE THE PROCEDURE   Eat and drink before the procedure only as directed by your health care provider.   Arrange for someone to take you home.  PROCEDURE  This procedure usually takes about 15-30 minutes.  You will be given one of the following:  A medicine that numbs the area in and around the cervix (local anesthetic).   A medicine to make you sleep through the procedure (general anesthetic).  You will lie on your back with your legs in stirrups.   A warm metal or plastic instrument (speculum) will be placed in your vagina to keep it open and to allow the health care provider to see the cervix.  There are two ways in which your cervix can be softened and dilated. These include:   Taking a medicine.   Having thin rods (laminaria) inserted into your cervix.   A curved tool (curette) will be used to scrape cells from the inside lining of the uterus. In some cases, gentle suction is applied with the curette. The curette will then be removed.  AFTER THE PROCEDURE   You will rest in the recovery area until you are stable and are ready to go home.   You may feel sick to your stomach (nauseous) or throw up (vomit) if you were given a general anesthetic.   You may have a sore throat if a tube was placed in your throat during general anesthesia.   You may have light cramping and bleeding. This may last for 2 days to 2 weeks after the procedure.   Your uterus needs to make a new lining after the  procedure. This may make your next period late.   This information is not intended to replace advice given to you by your health care provider. Make sure you discuss any questions you have with your health care provider.   Document Released: 12/25/2004 Document Revised: 08/27/2012 Document Reviewed: 07/24/2012 Elsevier Interactive Patient Education 2016 [employer] Dilation and Curettage or Vacuum Curettage, Care After Refer to this sheet in the next few weeks. These instructions provide you with information on caring for yourself after your procedure. Your health care provider may also give you more specific instructions. Your treatment has been planned according to current medical practices, but problems sometimes occur. Call your health care provider if you have any problems or questions after your procedure. WHAT TO EXPECT AFTER THE PROCEDURE After your procedure, it is typical to have light cramping and bleeding. This may last for 2 days to 2 weeks after the procedure. HOME CARE INSTRUCTIONS   Do not drive for 24 hours.  Wait 1 week before returning to strenuous activities.  Take your temperature 2 times a day for 4 days and write it down. Provide these temperatures to your health care provider if you develop a fever.  Avoid long periods of standing.  Avoid heavy lifting, pushing, or pulling. Do not lift anything heavier than 10 pounds (4.5 kg).  Limit stair climbing to once or twice a day.  Take rest periods often.  You may resume your usual diet.  Drink enough fluids to keep your urine clear or pale yellow.  Your usual bowel function should return. If you have constipation, you may:  Take a mild laxative with permission from your health care provider.  Add fruit and bran to your diet.  Drink more fluids.  Take  showers instead of baths until your health care provider gives you permission to take baths.  Do not go swimming or use a hot tub until your health care  provider approves.  Try to have someone with you or available to you the first 24-48 hours, especially if you were given a general anesthetic.  Do not douche, use tampons, or have sex (intercourse) for 2 weeks after the procedure.  Only take over-the-counter or prescription medicines as directed by your health care provider. Do not take aspirin. It can cause bleeding.  Follow up with your health care provider as directed. SEEK MEDICAL CARE IF:   You have increasing cramps or pain that is not relieved with medicine.  You have abdominal pain that does not seem to be related to the same area of earlier cramping and pain.  You have bad smelling vaginal discharge.  You have a rash.  You are having problems with any medicine. SEEK IMMEDIATE MEDICAL CARE IF:   You have bleeding that is heavier than a normal menstrual period.  You have a fever.  You have chest pain.  You have shortness of breath.  You feel dizzy or feel like fainting.  You pass out.  You have pain in your shoulder strap area.  You have heavy vaginal bleeding with or without blood clots. MAKE SURE YOU:   Understand these instructions.  Will watch your condition.  Will get help right away if you are not doing well or get worse.   This information is not intended to replace advice given to you by your health care provider. Make sure you discuss any questions you have with your health care provider.   Document Released: 12/23/1999 Document Revised: 12/30/2012 Document Reviewed: 07/24/2012 Elsevier Interactive Patient Education 2016 [employer] Bilateral Salpingo-Oophorectomy Bilateral salpingo-oophorectomy is the surgical removal of both fallopian tubes and both ovaries. The ovaries are small organs that produce eggs in women. The fallopian tubes transport the egg from the ovary to the womb (uterus). Usually, when this surgery is done, the uterus was previously removed. A bilateral salpingo-oophorectomy may  be done to treat cancer or to reduce the risk of cancer in women who are at high risk. Removing both fallopian tubes and both ovaries will make you unable to become pregnant (sterile). It will also put you into menopause so that you will no longer have menstrual periods and may have menopausal symptoms such as hot flashes, night sweats, and mood changes. It will not affect your sex drive. LET [facility] CARE PROVIDER KNOW ABOUT:  Any allergies you have.  All medicines you are taking, including vitamins, herbs, eye drops, creams, and over-the-counter medicines.  Previous problems you or members of your family have had with the use of anesthetics.  Any blood disorders you have.  Previous surgeries you have had.  Medical conditions you have. RISKS AND COMPLICATIONS Generally, this is a safe procedure. However, as with any procedure, complications can occur. Possible complications include:  Injury to surrounding organs.  Bleeding.  Infection.  Blood clots in the legs or lungs.  Problems related to anesthesia. BEFORE THE PROCEDURE  Ask your health care provider about changing or stopping your regular medicines. You may need to stop taking certain medicines, such as aspirin or blood thinners, at least 1 week before the surgery.  Do not eat or drink anything for at least 8 hours before the surgery.  If you smoke, do not smoke for at least 2 weeks before the surgery.  Make plans to have someone drive you home after the procedure or after your hospital stay. Also arrange for someone to help you with activities during recovery. PROCEDURE   You will be given medicine to help you relax before the procedure (sedative). You will then be given medicine to make you sleep through the procedure (general anesthetic). These medicines will be given through an IV access tube that is put into one of your veins.  Once you are asleep, your lower abdomen will be shaved and cleaned. A thin, flexible  tube (catheter) will be placed in your bladder.  The surgeon may use a laparoscopic, robotic, or open technique for this surgery:  In the laparoscopic technique, the surgery is done through two small cuts (incisions) in the abdomen. A thin, lighted tube with a tiny camera on the end (laparoscope) is inserted into one of the incisions. The tools needed for the procedure are put through the other incision.  A robotic technique may be chosen to perform complex surgery in a small space. In the robotic technique, small incisions will be made. A camera and surgical instruments are passed through the incisions. Surgical instruments will be controlled with the help of a robotic arm.  In the open technique, the surgery is done through one large incision in the abdomen.  Using any of these techniques, the surgeon removes the fallopian tubes and ovaries. The blood vessels will be clamped and tied.  The surgeon then uses staples or stitches to close the incision or incisions. AFTER THE PROCEDURE  You will be taken to a recovery area where you will be monitored for 1 to 3 hours. Your blood pressure, pulse, and temperature will be checked often. You will remain in the recovery area until you are stable and waking up.  If the laparoscopic technique was used, you may be allowed to go home after several hours. You may have some shoulder pain after the laparoscopic procedure. This is normal and usually goes away in a day or two.  If the open technique was used, you will be admitted to the hospital for a couple of days.  You will be given pain medicine as needed.  The IV access tube and catheter will be removed before you are discharged.   This information is not intended to replace advice given to you by your health care provider. Make sure you discuss any questions you have with your health care provider.   Document Released: 12/25/2004 Document Revised: 12/30/2012 Document Reviewed: 06/18/2012 Elsevier  Interactive Patient Education 2016 [employer] Salpingooferectoma bilateral - Cuidados posteriores (Bilateral Salpingo-Oophorectomy, Care After) Siga estas instrucciones durante las prximas semanas. Estas indicaciones le proporcionan informacin general acerca de cmo deber cuidarse despus del procedimiento. El mdico tambin podr darle instrucciones ms especficas. El tratamiento se ha planificado de acuerdo a las prcticas mdicas actuales, pero a veces se producen problemas. Comunquese con el mdico si tiene algn problema o tiene dudas despus del procedimiento. QU ESPERAR DESPUS DEL PROCEDIMIENTO Despus del procedimiento, es tpico tener las siguientes sensaciones:   Dolor abdominal que puede controlarse con medicamentos.  Prdida o hemorragia vaginal.  Estreimiento.  Sntomas menopusicos como calores, sequedad vaginal y cambios de humor. INSTRUCCIONES PARA EL CUIDADO EN EL HOGAR   Descanse y duerma lo suficiente.  Tome slo medicamentos de venta libre o recetados, segn las indicaciones del mdico. No tome aspirina. Puede ocasionar hemorragias.  Mantenga las zonas de la incisin limpias y secas. Retire o cambie los apsitos (vendajes) slo  como le indic su mdico.  Tome slo duchas y no baos, durante algunas semanas, segn las indicaciones de su mdico.  Limite las actividades segn las indicaciones del mdico. No levante ningn objeto que sea ms pesado que 5 libras (2.3 kg) hasta que el mdico la autorice.  No conduzca vehculos hasta que el mdico lo autorice.  Siga las indicaciones de su mdico con respecto al tratamiento para el asma antes de hacer actividad fsica. Puede retomar su dieta habitual inmediatamente.  Beba suficiente lquido para Photographer orina clara o de color amarillo plido.  No se haga duchas vaginales ni tenga relaciones sexuales durante las 6 semanas siguientes a la [place].  No beba alcohol hasta que el mdico la autorice.  Tmese  la [employer] veces por da y Engineering geologist.  Si est constipada podr:   Consultar a su mdico si puede tomar un laxante suave.  Agregar frutas y salvado a su dieta.  Beber ms lquidos.  Concurra a las consultas de control con su mdico segn las indicaciones. SOLICITE ATENCIN MDICA SI:   La zona de la incisin est roja, se hincha o Teacher, music.  Tiene pus en el sitio de la incisin.  Advierte un olor ftido que proviene de la herida o del vendaje.  Tiene dolor, enrojecimiento o hinchazn en la zona en la que fue colocada la va intravenosa.  La incisin se ha abierto (los bordes no se mantienen juntos).  Se siente mareada o sufre un desmayo.  Siente dolor o tiene una hemorragia al [employer].  Tiene diarrea.  Presenta nuseas o vmitos.  Brett Fairy hemorragia vaginal anormal.  Le aparece una erupcin cutnea.  Aumenta el dolor y no puede controlarlo con Tourist information centre manager. SOLICITE ATENCIN MDICA DE INMEDIATO SI:   Tiene fiebre.  Siente dolor abdominal.  Siente dolor en el pecho.  Le falta el aire.  Se desmaya.  Siente dolor, u observa hinchazn o enrojecimiento en la pierna.  Tiene una hemorragia vaginal abundante, con o sin cogulos.   Esta informacin no tiene Theme park manager el consejo del mdico. Asegrese de hacerle al mdico cualquier pregunta que tenga.   Document Released: 03/21/2009 Document Revised: 08/27/2012 Elsevier Interactive Patient Education 2016 [employer]. PATIENT INSTRUCTIONS POST-ANESTHESIA  IMMEDIATELY FOLLOWING SURGERY:  Do not drive or operate machinery for the first twenty four hours after surgery.  Do not make any important decisions for twenty four hours after surgery or while taking narcotic pain medications or sedatives.  If you develop intractable nausea and vomiting or a severe headache please notify your doctor immediately.  FOLLOW-UP:  Please make an appointment with your surgeon as instructed. You do not  need to follow up with anesthesia unless specifically instructed to do so.  WOUND CARE INSTRUCTIONS (if applicable):  Keep a dry clean dressing on the anesthesia/puncture wound site if there is drainage.  Once the wound has quit draining you may leave it open to air.  Generally you should leave the bandage intact for twenty four hours unless there is drainage.  If the epidural site drains for more than 36-48 hours please call the anesthesia department.  QUESTIONS?:  Please feel free to call your physician or the hospital operator if you have any questions, and they will be happy to assist you.

## 2015-05-27 ENCOUNTER — Encounter (HOSPITAL_COMMUNITY)
Admission: RE | Admit: 2015-05-27 | Discharge: 2015-05-27 | Disposition: A | Discharge status: Home / Self Care | Payer: Medicaid Other | Source: Ambulatory Visit | Attending: Obstetrics and Gynecology | Admitting: Obstetrics and Gynecology

## 2015-05-30 ENCOUNTER — Ambulatory Visit (INDEPENDENT_AMBULATORY_CARE_PROVIDER_SITE_OTHER): Payer: Medicaid Other | Admitting: Family Medicine

## 2015-05-30 ENCOUNTER — Other Ambulatory Visit (HOSPITAL_COMMUNITY): Payer: Medicaid Other

## 2015-05-30 ENCOUNTER — Encounter: Payer: Self-pay | Admitting: Family Medicine

## 2015-05-30 VITALS — BP 126/79 | HR 68 | Temp 98.9°F | Wt 257.8 lb

## 2015-05-30 DIAGNOSIS — G5621 Lesion of ulnar nerve, right upper limb: Secondary | ICD-10-CM

## 2015-05-30 MED ORDER — PREDNISONE 20 MG PO TABS: ORAL_TABLET | ORAL | Status: DC

## 2015-05-30 NOTE — Progress Notes (Signed)
BP 126/79 mmHg  Pulse 68  Temp(Src) 98.9 F (37.2 C) (Oral)  Wt 257 lb 12.8 oz (116.937 kg)  LMP 05/28/2015  Breastfeeding? Yes   Subjective:    Patient ID: [name], female    DOB: [birth year], 30 y.o.   MRN: [id number]  HPI: Gina NewnessJacqueline Moody is a 30 y.o. female presenting on 05/30/2015 for Numbness in right hand   HPI Numbness in right hand Patient comes in today because she has been having numbness in her right hand on the ring and pinky fingers that is been going on for the past month. She describes a tingling and burning sensation as well as a numbness in those fingers and the palm of her hand proximal to the sphincters as well. She also complained of having some grip strength loss as well. She has not tried any medications for this and is the first time she is seen a physician for this.  Relevant past medical, surgical, family and social history reviewed and updated as indicated. Interim medical history since our last visit reviewed. Allergies and medications reviewed and updated.  Review of Systems  Constitutional: Negative for fever and chills.  HENT: Negative for congestion, ear discharge and ear pain.   Eyes: Negative for redness and visual disturbance.  Respiratory: Negative for chest tightness and shortness of breath.   Cardiovascular: Negative for chest pain and leg swelling.  Genitourinary: Negative for dysuria and difficulty urinating.  Musculoskeletal: Negative for back pain and gait problem.  Skin: Negative for rash.  Neurological: Positive for weakness (Grip strength loss) and numbness. Negative for dizziness, seizures, syncope, light-headedness and headaches.  Psychiatric/Behavioral: Negative for behavioral problems and agitation.  All other systems reviewed and are negative.   Per HPI unless specifically indicated above     Medication List       This list is accurate as of: 05/30/15  2:43 PM.  Always use your most recent med list.               albuterol 108 (90 Base) MCG/ACT inhaler  Commonly known as:  PROVENTIL HFA;VENTOLIN HFA  Inhale 2 puffs into the lungs every 6 (six) hours as needed for wheezing.     ibuprofen 600 MG tablet  Commonly known as:  ADVIL,MOTRIN  Take 1 tablet (600 mg total) by mouth every 6 (six) hours as needed.     predniSONE 20 MG tablet  Commonly known as:  DELTASONE  2 po at same time daily for 5 days     PRENATAL VITAMINS PLUS 27-1 MG Tabs  Take 1 tablet by mouth daily.           Objective:    BP 126/79 mmHg  Pulse 68  Temp(Src) 98.9 F (37.2 C) (Oral)  Wt 257 lb 12.8 oz (116.937 kg)  LMP 05/28/2015  Breastfeeding? Yes  Wt Readings from Last 3 Encounters:  05/30/15 257 lb 12.8 oz (116.937 kg)  05/25/15 253 lb (114.76 kg)  05/24/15 253 lb (114.76 kg)    Physical Exam  Constitutional: She is oriented to person, place, and time. She appears well-developed and well-nourished. No distress.  Eyes: Conjunctivae and EOM are normal. Pupils are equal, round, and reactive to light.  Neck: Neck supple. No thyromegaly present.  Cardiovascular: Normal rate, regular rhythm, normal heart sounds and intact distal pulses.   No murmur heard. Pulmonary/Chest: Effort normal and breath sounds normal. No respiratory distress. She has no wheezes.  Musculoskeletal: Normal range of motion. She exhibits no edema  or tenderness.       Right hand: She exhibits normal range of motion, no tenderness, normal capillary refill and no deformity. Decreased sensation noted. Decreased sensation is present in the ulnar distribution. Normal strength noted. She exhibits no finger abduction, no thumb/finger opposition and no wrist extension trouble.  Lymphadenopathy:    She has no cervical adenopathy.  Neurological: She is alert and oriented to person, place, and time. Coordination normal.  Skin: Skin is warm and dry. No rash noted. She is not diaphoretic.  Psychiatric: She has a normal mood and affect. Her  behavior is normal.  Nursing note and vitals reviewed.     Assessment & Plan:       Problem List Items Addressed This Visit    None    Visit Diagnoses    Ulnar tunnel syndrome of right wrist    -  Primary    Patient is right-handed, will try prednisone, if not improved or return and then consider orthopedic referral    Relevant Medications    predniSONE (DELTASONE) 20 MG tablet       Follow up plan: Return if symptoms worsen or fail to improve.  Counseling provided for all of the vaccine components No orders of the defined types were placed in this encounter.    Arville Care, MD [facility] 05/30/2015, 2:43 PM

## 2015-05-31 ENCOUNTER — Encounter (HOSPITAL_COMMUNITY)
Admission: RE | Disposition: A | Discharge status: Home / Self Care | Payer: Self-pay | Source: Ambulatory Visit | Attending: Obstetrics and Gynecology

## 2015-05-31 SURGERY — SALPINGECTOMY, BILATERAL, LAPAROSCOPIC
Anesthesia: General | Laterality: Bilateral

## 2015-06-01 DIAGNOSIS — N920 Excessive and frequent menstruation with regular cycle: Secondary | ICD-10-CM | POA: Insufficient documentation

## 2015-06-01 DIAGNOSIS — Z302 Encounter for sterilization: Secondary | ICD-10-CM | POA: Insufficient documentation

## 2015-06-03 ENCOUNTER — Encounter (HOSPITAL_COMMUNITY): Payer: Self-pay

## 2015-06-03 ENCOUNTER — Encounter (HOSPITAL_COMMUNITY)
Admission: RE | Admit: 2015-06-03 | Discharge: 2015-06-03 | Disposition: A | Discharge status: Home / Self Care | Payer: Medicaid Other | Source: Ambulatory Visit | Attending: Obstetrics and Gynecology | Admitting: Obstetrics and Gynecology

## 2015-06-03 DIAGNOSIS — N920 Excessive and frequent menstruation with regular cycle: Secondary | ICD-10-CM | POA: Insufficient documentation

## 2015-06-03 DIAGNOSIS — Z01812 Encounter for preprocedural laboratory examination: Secondary | ICD-10-CM | POA: Diagnosis not present

## 2015-06-03 HISTORY — DX: Sleep apnea, unspecified: G47.30

## 2015-06-03 LAB — CBC
HEMATOCRIT: 36 % (ref 36.0–46.0)
HEMOGLOBIN: 12.1 g/dL (ref 12.0–15.0)
MCH: 31 pg (ref 26.0–34.0)
MCHC: 33.6 g/dL (ref 30.0–36.0)
MCV: 92.3 fL (ref 78.0–100.0)
Platelets: 295 10*3/uL (ref 150–400)
RBC: 3.9 MIL/uL (ref 3.87–5.11)
RDW: 14.4 % (ref 11.5–15.5)
WBC: 13.8 10*3/uL — ABNORMAL HIGH (ref 4.0–10.5)

## 2015-06-03 LAB — URINALYSIS, ROUTINE W REFLEX MICROSCOPIC
BILIRUBIN URINE: NEGATIVE
GLUCOSE, UA: NEGATIVE mg/dL
KETONES UR: NEGATIVE mg/dL
Leukocytes, UA: NEGATIVE
NITRITE: NEGATIVE
PH: 5.5 (ref 5.0–8.0)
Protein, ur: NEGATIVE mg/dL
SPECIFIC GRAVITY, URINE: 1.02 (ref 1.005–1.030)

## 2015-06-03 LAB — URINE MICROSCOPIC-ADD ON

## 2015-06-03 LAB — COMPREHENSIVE METABOLIC PANEL
ALBUMIN: 4.2 g/dL (ref 3.5–5.0)
ALK PHOS: 52 U/L (ref 38–126)
ALT: 31 U/L (ref 14–54)
ANION GAP: 7 (ref 5–15)
AST: 16 U/L (ref 15–41)
BILIRUBIN TOTAL: 0.2 mg/dL — AB (ref 0.3–1.2)
BUN: 13 mg/dL (ref 6–20)
CALCIUM: 9.1 mg/dL (ref 8.9–10.3)
CO2: 24 mmol/L (ref 22–32)
CREATININE: 0.7 mg/dL (ref 0.44–1.00)
Chloride: 107 mmol/L (ref 101–111)
GFR calc Af Amer: >60 mL/min (ref >60)
GFR calc non Af Amer: >60 mL/min (ref >60)
GLUCOSE: 88 mg/dL (ref 65–99)
Potassium: 3.4 mmol/L — ABNORMAL LOW (ref 3.5–5.1)
Sodium: 138 mmol/L (ref 135–145)
TOTAL PROTEIN: 6.7 g/dL (ref 6.5–8.1)

## 2015-06-03 LAB — HCG, SERUM, QUALITATIVE: Preg, Serum: NEGATIVE

## 2015-06-03 NOTE — Patient Instructions (Signed)
[name]  06/03/2015     @   Your procedure is scheduled on 06/07/2015  Report to [facility] at 7:30 [facility].M.  Call this number if you have problems the morning of surgery:  [phone]   Remember:  Do not eat food or drink liquids after midnight.  Take these medicines the morning of surgery with A SIP OF WATER :  PREDNISONE    PLEASE USE YOUR PROVENTIL INHALER BEFORE LEAVING HOME AND BRING IT WITH YOU TO THE HOSPITAL   Do not wear jewelry, make-up or nail polish.  Do not wear lotions, powders, or perfumes.  You may wear deodorant.  Do not shave 48 hours prior to surgery.  Men may shave face and neck.  Do not bring valuables to the hospital.  [facility] is not responsible for any belongings or valuables.  Contacts, dentures or bridgework may not be worn into surgery.  Leave your suitcase in the car.  After surgery it may be brought to your room.  For patients admitted to the hospital, discharge time will be determined by your treatment team.  Patients discharged the day of surgery will not be allowed to drive home.   Name and phone number of your driver:   FAMILY Special instructions:  N/A  Please read over the following fact sheets that you were given. Care and Recovery After Surgery   Salpingectomy Salpingectomy, also called tubectomy, is the surgical removal of one of the fallopian tubes. The fallopian tubes are tubes that are connected to the uterus. These tubes transport the egg from the ovary to the uterus. A salpingectomy may be done for various reasons, including:   A tubal (ectopic) pregnancy. This is especially true if the tube ruptures.  An infected fallopian tube.  The need to remove the fallopian tube when removing an ovary with a cyst or tumor.  The need to remove the fallopian tube when removing the uterus.  Cancer of the fallopian tube or nearby organs. Removing one fallopian tube does not prevent you from becoming  pregnant. It also does not cause problems with your menstrual periods.  LET [facility] CARE PROVIDER KNOW ABOUT:  Any allergies you have.  All medicines you are taking, including vitamins, herbs, eye drops, creams, and over-the-counter medicines.  Previous problems you or members of your family have had with the use of anesthetics.  Any blood disorders you have.  Previous surgeries you have had.  Medical conditions you have. RISKS AND COMPLICATIONS  Generally, this is a safe procedure. However, as with any procedure, complications can occur. Possible complications include:  Injury to surrounding organs.  Bleeding.  Infection.  Problems related to anesthesia. BEFORE THE PROCEDURE  Ask your health care provider about changing or stopping your regular medicines. You may need to stop taking certain medicines, such as aspirin or blood thinners, at least 1 week before the surgery.  Do not eat or drink anything for at least 8 hours before the surgery.  If you smoke, do not smoke for at least 2 weeks before the surgery.  Make plans to have someone drive you home after the procedure or after your hospital stay. Also arrange for someone to help you with activities during recovery. PROCEDURE   You will be given medicine to help you relax before the procedure (sedative). You will then be given medicine to make you sleep through the procedure (general anesthetic). These medicines will be given through an IV access tube that is put  into one of your veins.  Once you are asleep, your lower abdomen will be shaved and cleaned. A thin, flexible tube (catheter) will be placed in your bladder.  The surgeon may use a laparoscopic, robotic, or open technique for this surgery:  In the laparoscopic technique, the surgery is done through two small cuts (incisions) in the abdomen. A thin, lighted tube with a tiny camera on the end (laparoscope) is inserted into one of the incisions. The tools needed  for the procedure are put through the other incision.  A robotic technique may be chosen to perform complex surgery in a small space. In the robotic technique, small incisions will be made. A camera and surgical instruments are passed through the incisions. Surgical instruments will be controlled with the help of a robotic arm.  In the open technique, the surgery is done through one large incision in the abdomen.  Using any of these techniques, the surgeon removes the fallopian tube from where it attaches to the uterus. The blood vessels will be clamped and tied.  The surgeon then uses staples or stitches to close the incision or incisions. AFTER THE PROCEDURE   You will be taken to a recovery area where your progress will be monitored for 1-3 hours.  If the laparoscopic technique was used, you may be allowed to go home after several hours. You may have some shoulder pain after the laparoscopic procedure. This is normal and usually goes away in a day or two.  If the open technique was used, you will be admitted to the hospital for a couple of days.  You will be given pain medicine if needed.  The IV access tube and catheter will be removed before you are discharged.   This information is not intended to replace advice given to you by your health care provider. Make sure you discuss any questions you have with your health care provider.   Document Released: 05/13/2008 Document Revised: 01/15/2014 Document Reviewed: 06/18/2012 Elsevier Interactive Patient Education 2016 Elsevier Inc. Dilation and Curettage or Vacuum Curettage Dilation and curettage (D&C) and vacuum curettage are minor procedures. A D&C involves stretching (dilation) the cervix and scraping (curettage) the inside lining of the womb (uterus). During a D&C, tissue is gently scraped from the inside lining of the uterus. During a vacuum curettage, the lining and tissue in the uterus are removed with the use of gentle suction.   Curettage may be performed to either diagnose or treat a problem. As a diagnostic procedure, curettage is performed to examine tissues from the uterus. A diagnostic curettage may be performed for the following symptoms:   Irregular bleeding in the uterus.   Bleeding with the development of clots.   Spotting between menstrual periods.   Prolonged menstrual periods.   Bleeding after menopause.   No menstrual period (amenorrhea).   A change in size and shape of the uterus.  As a treatment procedure, curettage may be performed for the following reasons:   Removal of an IUD (intrauterine device).   Removal of retained placenta after giving birth. Retained placenta can cause an infection or bleeding severe enough to require transfusions.   Abortion.   Miscarriage.   Removal of polyps inside the uterus.   Removal of uncommon types of noncancerous lumps (fibroids).  LET [facility] CARE PROVIDER KNOW ABOUT:   Any allergies you have.   All medicines you are taking, including vitamins, herbs, eye drops, creams, and over-the-counter medicines.   Previous problems you or  members of your family have had with the use of anesthetics.   Any blood disorders you have.   Previous surgeries you have had.   Medical conditions you have. RISKS AND COMPLICATIONS  Generally, this is a safe procedure. However, as with any procedure, complications can occur. Possible complications include:  Excessive bleeding.   Infection of the uterus.   Damage to the cervix.   Development of scar tissue (adhesions) inside the uterus, later causing abnormal amounts of menstrual bleeding.   Complications from the general anesthetic, if a general anesthetic is used.   Putting a hole (perforation) in the uterus. This is rare.  BEFORE THE PROCEDURE   Eat and drink before the procedure only as directed by your health care provider.   Arrange for someone to take you home.   PROCEDURE  This procedure usually takes about 15-30 minutes.  You will be given one of the following:  A medicine that numbs the area in and around the cervix (local anesthetic).   A medicine to make you sleep through the procedure (general anesthetic).  You will lie on your back with your legs in stirrups.   A warm metal or plastic instrument (speculum) will be placed in your vagina to keep it open and to allow the health care provider to see the cervix.  There are two ways in which your cervix can be softened and dilated. These include:   Taking a medicine.   Having thin rods (laminaria) inserted into your cervix.   A curved tool (curette) will be used to scrape cells from the inside lining of the uterus. In some cases, gentle suction is applied with the curette. The curette will then be removed.  AFTER THE PROCEDURE   You will rest in the recovery area until you are stable and are ready to go home.   You may feel sick to your stomach (nauseous) or throw up (vomit) if you were given a general anesthetic.   You may have a sore throat if a tube was placed in your throat during general anesthesia.   You may have light cramping and bleeding. This may last for 2 days to 2 weeks after the procedure.   Your uterus needs to make a new lining after the procedure. This may make your next period late.   This information is not intended to replace advice given to you by your health care provider. Make sure you discuss any questions you have with your health care provider.   Document Released: 12/25/2004 Document Revised: 08/27/2012 Document Reviewed: 07/24/2012 Elsevier Interactive Patient Education [employer] Elsevier Inc[employer] Hysteroscopy Hysteroscopy is a procedure used for looking inside the womb (uterus). It may be done for various reasons, including:  To evaluate abnormal bleeding, fibroid (benign, noncancerous) tumors, polyps, scar tissue (adhesions), and possibly cancer of  the uterus.  To look for lumps (tumors) and other uterine growths.  To look for causes of why a woman cannot get pregnant (infertility), causes of recurrent loss of pregnancy (miscarriages), or a lost intrauterine device (IUD).  To perform a sterilization by blocking the fallopian tubes from inside the uterus. In this procedure, a thin, flexible tube with a tiny light and camera on the end of it (hysteroscope) is used to look inside the uterus. A hysteroscopy should be done right after a menstrual period to be sure you are not pregnant. LET [facility] PROVIDER KNOW ABOUT:   Any allergies you have.  All medicines you are taking, including vitamins,  herbs, eye drops, creams, and over-the-counter medicines.  Previous problems you or members of your family have had with the use of anesthetics.  Any blood disorders you have.  Previous surgeries you have had.  Medical conditions you have. RISKS AND COMPLICATIONS  Generally, this is a safe procedure. However, as with any procedure, complications can occur. Possible complications include:  Putting a hole in the uterus.  Excessive bleeding.  Infection.  Damage to the cervix.  Injury to other organs.  Allergic reaction to medicines.  Too much fluid used in the uterus for the procedure. BEFORE THE PROCEDURE   Ask your health care provider about changing or stopping any regular medicines.  Do not take aspirin or blood thinners for 1 week before the procedure, or as directed by your health care provider. These can cause bleeding.  If you smoke, do not smoke for 2 weeks before the procedure.  In some cases, a medicine is placed in the cervix the day before the procedure. This medicine makes the cervix have a larger opening (dilate). This makes it easier for the instrument to be inserted into the uterus during the procedure.  Do not eat or drink anything for at least 8 hours before the surgery.  Arrange for someone to take you  home after the procedure. PROCEDURE   You may be given a medicine to relax you (sedative). You may also be given one of the following:  A medicine that numbs the area around the cervix (local anesthetic).  A medicine that makes you sleep through the procedure (general anesthetic).  The hysteroscope is inserted through the vagina into the uterus. The camera on the hysteroscope sends a picture to a TV screen. This gives the surgeon a good view inside the uterus.  During the procedure, air or a liquid is put into the uterus, which allows the surgeon to see better.  Sometimes, tissue is gently scraped from inside the uterus. These tissue samples are sent to a lab for testing. AFTER THE PROCEDURE   If you had a general anesthetic, you may be groggy for a couple hours after the procedure.  If you had a local anesthetic, you will be able to go home as soon as you are stable and feel ready.  You may have some cramping. This normally lasts for a couple days.  You may have bleeding, which varies from light spotting for a few days to menstrual-like bleeding for 3-7 days. This is normal.  If your test results are not back during the visit, make an appointment with your health care provider to find out the results.   This information is not intended to replace advice given to you by your health care provider. Make sure you discuss any questions you have with your health care provider.   Document Released: 04/02/2000 Document Revised: 10/15/2012 Document Reviewed: 07/24/2012 Elsevier Interactive Patient Education [employer]

## 2015-06-06 NOTE — H&P (Signed)
Author Note Status Last Update User Last Update Date/Time    Tilda BurrowJohn Kellie Murrill V, MD Signed Tilda BurrowJohn Anvitha Hutmacher V, MD 05/25/2015 3:27 PM    Progress Notes    Expand All Collapse All   Patient ID: [name], female DOB: 1985/03/13, 30 y.o. MRN: [id number]  Preoperative History and Physical  [name] is a 30 y.o. [id number] here for surgical management of permanent sterilization and menorrhagia No significant preoperative concerns. Patient complains of painful, heavy periods. She notes 7 days of bleeding. Patient is 6 weeks postpartum. She is abstinent. She is breast-feeding. She describes periods as undesirable heavy tubal sterilization is discussed with surgical technique risks to adjacent organs, potential complications. Additionally we discussed her heavy menses and discussed the options of IUD versus endometrial ablation. Patient is desirous of the more definitive and long-lasting endometrial ablation to be performed at time of tubal ligation  Proposed surgery: Bilateral tubal sterilization by bilateral salpingectomy and endometrial ablation  Past Medical History  Diagnosis Date  . Asthma    Past Surgical History  Procedure Laterality Date  . Left arm surgery    . Cholecystectomy  2009   OB History  Gravida Para Term Preterm AB SAB TAB Ectopic Multiple Living  5 5 5       0 5    # Outcome Date GA Lbr Len/2nd Weight Sex Delivery Anes PTL Lv  5 Term 04/12/15 5439w4d 02:02 / 00:14 9 lb 8 oz (4.31 kg) M Vag-Spont None  Y  4 Term 06/17/13 6222w0d  8 lb (3.629 kg) M Vag-Breech None N Y  3 Term 05/30/12 4822w0d  7 lb 3 oz (3.26 kg) F Vag-Spont EPI N Y  2 Term 01/26/09 8122w0d  8 lb 3 oz (3.714 kg) M Vag-Spont None N Y  1 Term 02/12/07 7922w0d  8 lb 1 oz (3.657 kg) M Vag-Spont EPI N Y    Patient denies any other pertinent gynecologic issues.   Current Outpatient  Prescriptions on File Prior to Visit  Medication Sig Dispense Refill  . albuterol (PROVENTIL HFA;VENTOLIN HFA) 108 (90 BASE) MCG/ACT inhaler Inhale 2 puffs into the lungs every 6 (six) hours as needed for wheezing. 1 Inhaler 11  . ibuprofen (ADVIL,MOTRIN) 600 MG tablet Take 1 tablet (600 mg total) by mouth every 6 (six) hours as needed. 30 tablet 0  . Prenatal Vit-Fe Fumarate-FA (PRENATAL VITAMINS PLUS) 27-1 MG TABS Take 1 tablet by mouth daily.     No current facility-administered medications on file prior to visit.   Allergies  Allergen Reactions  . Aspirin     rash    Social History:  reports that she has been smoking Cigarettes. She started smoking about 12 years ago. She has been smoking about 0.25 packs per day. She has never used smokeless tobacco. She reports that she does not drink alcohol or use illicit drugs.  No family history on file.  Review of Systems: Noncontributory  PHYSICAL EXAM: Performed 05/24/2015 currently breastfeeding. General appearance - alert, well appearing, and in no distress Chest - clear to auscultation, no wheezes, rales or rhonchi, symmetric air entry Heart - normal rate and regular rhythm Abdomen - soft, nontender, nondistended, no masses or organomegaly Pelvic exam: normal external  VULVA: normal appearing vulva with no masses, tenderness or lesions,  VAGINA: normal appearing vagina with normal color and discharge, no lesions,  CERVIX: normal appearing cervix, closed Extremities - peripheral pulses normal, no pedal edema, no clubbing or cyanosis Labs: No results found for this or any  previous visit (from the past 336 hour(s)). CBC Latest Ref Rng 04/13/2015 04/12/2015 03/09/2015  WBC 4.0 - 10.5 K/uL 17.8(H) 15.2(H) 8.5  Hemoglobin 12.0 - 15.0 g/dL 11.8(L) 12.7 -  Hematocrit 36.0 - 46.0 % 34.3(L) 37.4 36.2  Platelets 150 - 400 K/uL 272 278 278     Imaging Studies:  Imaging Results     No results found.    Assessment: Desire for permanent sterilization 2. Heavy menses for endometrial ablation Patient Active Problem List   Diagnosis Date Noted  . SVD (spontaneous vaginal delivery) 04/12/2015  . Pyelectasis of fetus on prenatal ultrasound 03/29/2015  . Uterine size date discrepancy 03/23/2015  . Late prenatal care 03/09/2015  . Supervision of normal pregnancy 03/09/2015    Plan: Patient will undergo surgical management with bilateral tubal Sterilization by bilateral salpingectomy and hysteroscopy D&C endometrial ablation.    .mec 05/25/2015 2:48 PM   By signing my name below, I, Ronney Lion, attest that this documentation has been prepared under the direction and in the presence of [name], MD. Electronically Signed: Ronney Lion, ED Scribe. 05/25/2015. 3:05 PM.  I personally performed the services described in this documentation, which was SCRIBED in my presence. The recorded information has been reviewed and considered accurate. It has been edited as necessary during review.

## 2015-06-07 ENCOUNTER — Ambulatory Visit (HOSPITAL_COMMUNITY)
Admission: RE | Admit: 2015-06-07 | Discharge: 2015-06-07 | Disposition: A | Discharge status: Home / Self Care | Payer: Medicaid Other | Source: Ambulatory Visit | Attending: Obstetrics and Gynecology | Admitting: Obstetrics and Gynecology

## 2015-06-07 ENCOUNTER — Ambulatory Visit (HOSPITAL_COMMUNITY): Payer: Medicaid Other | Admitting: Anesthesiology

## 2015-06-07 ENCOUNTER — Encounter (HOSPITAL_COMMUNITY): Payer: Self-pay | Admitting: *Deleted

## 2015-06-07 ENCOUNTER — Encounter: Payer: Medicaid Other | Admitting: Obstetrics and Gynecology

## 2015-06-07 ENCOUNTER — Encounter (HOSPITAL_COMMUNITY)
Admission: RE | Disposition: A | Discharge status: Home / Self Care | Payer: Self-pay | Source: Ambulatory Visit | Attending: Obstetrics and Gynecology

## 2015-06-07 DIAGNOSIS — J45909 Unspecified asthma, uncomplicated: Secondary | ICD-10-CM | POA: Insufficient documentation

## 2015-06-07 DIAGNOSIS — Z79899 Other long term (current) drug therapy: Secondary | ICD-10-CM | POA: Diagnosis not present

## 2015-06-07 DIAGNOSIS — F17210 Nicotine dependence, cigarettes, uncomplicated: Secondary | ICD-10-CM | POA: Diagnosis not present

## 2015-06-07 DIAGNOSIS — G4730 Sleep apnea, unspecified: Secondary | ICD-10-CM | POA: Diagnosis not present

## 2015-06-07 DIAGNOSIS — Z9049 Acquired absence of other specified parts of digestive tract: Secondary | ICD-10-CM | POA: Insufficient documentation

## 2015-06-07 DIAGNOSIS — Z886 Allergy status to analgesic agent status: Secondary | ICD-10-CM | POA: Diagnosis not present

## 2015-06-07 DIAGNOSIS — N920 Excessive and frequent menstruation with regular cycle: Secondary | ICD-10-CM | POA: Diagnosis present

## 2015-06-07 DIAGNOSIS — Z302 Encounter for sterilization: Secondary | ICD-10-CM

## 2015-06-07 HISTORY — PX: DILITATION & CURRETTAGE/HYSTROSCOPY WITH NOVASURE ABLATION: SHX5568

## 2015-06-07 HISTORY — PX: LAPAROSCOPIC BILATERAL SALPINGECTOMY: SHX5889

## 2015-06-07 SURGERY — DILATATION & CURETTAGE/HYSTEROSCOPY WITH NOVASURE ABLATION
Anesthesia: General | Site: Uterus

## 2015-06-07 MED ORDER — FENTANYL CITRATE (PF) 100 MCG/2ML IJ SOLN
INTRAMUSCULAR | Status: AC
  Filled 2015-06-07: qty 2

## 2015-06-07 MED ORDER — ONDANSETRON HCL 4 MG/2ML IJ SOLN
INTRAMUSCULAR | Status: AC
  Filled 2015-06-07: qty 2

## 2015-06-07 MED ORDER — FENTANYL CITRATE (PF) 250 MCG/5ML IJ SOLN
INTRAMUSCULAR | Status: AC
  Filled 2015-06-07: qty 5

## 2015-06-07 MED ORDER — OXYCODONE-ACETAMINOPHEN 5-325 MG PO TABS: 1.0000 | ORAL_TABLET | ORAL | Status: DC | PRN

## 2015-06-07 MED ORDER — GLYCOPYRROLATE 0.2 MG/ML IJ SOLN
INTRAMUSCULAR | Status: DC | PRN
  Administered 2015-06-07: 0.6 mg via INTRAVENOUS

## 2015-06-07 MED ORDER — KETOROLAC TROMETHAMINE 30 MG/ML IJ SOLN
30.0000 mg | Freq: Once | INTRAMUSCULAR | Status: AC
  Administered 2015-06-07: 30 mg via INTRAVENOUS

## 2015-06-07 MED ORDER — PROPOFOL 10 MG/ML IV BOLUS
INTRAVENOUS | Status: AC
  Filled 2015-06-07: qty 20

## 2015-06-07 MED ORDER — SUCCINYLCHOLINE CHLORIDE 20 MG/ML IJ SOLN
INTRAMUSCULAR | Status: AC
  Filled 2015-06-07: qty 1

## 2015-06-07 MED ORDER — EPHEDRINE SULFATE 50 MG/ML IJ SOLN
INTRAMUSCULAR | Status: AC
  Filled 2015-06-07: qty 3

## 2015-06-07 MED ORDER — BUPIVACAINE HCL (PF) 0.5 % IJ SOLN
INTRAMUSCULAR | Status: DC | PRN
  Administered 2015-06-07: 20 mL

## 2015-06-07 MED ORDER — ONDANSETRON HCL 4 MG/2ML IJ SOLN
INTRAMUSCULAR | Status: DC | PRN
  Administered 2015-06-07: 4 mg via INTRAVENOUS

## 2015-06-07 MED ORDER — LIDOCAINE HCL (CARDIAC) 20 MG/ML IV SOLN
INTRAVENOUS | Status: DC | PRN
  Administered 2015-06-07: 50 mg via INTRAVENOUS

## 2015-06-07 MED ORDER — DEXAMETHASONE SODIUM PHOSPHATE 4 MG/ML IJ SOLN
4.0000 mg | Freq: Once | INTRAMUSCULAR | Status: AC
  Administered 2015-06-07: 4 mg via INTRAVENOUS

## 2015-06-07 MED ORDER — KETOROLAC TROMETHAMINE 10 MG PO TABS: 10.0000 mg | ORAL_TABLET | Freq: Four times a day (QID) | ORAL | Status: DC | PRN

## 2015-06-07 MED ORDER — MIDAZOLAM HCL 2 MG/2ML IJ SOLN
INTRAMUSCULAR | Status: AC
  Filled 2015-06-07: qty 2

## 2015-06-07 MED ORDER — FENTANYL CITRATE (PF) 100 MCG/2ML IJ SOLN
25.0000 ug | INTRAMUSCULAR | Status: DC | PRN
  Administered 2015-06-07: 25 ug via INTRAVENOUS
  Administered 2015-06-07: 50 ug via INTRAVENOUS
  Administered 2015-06-07: 25 ug via INTRAVENOUS

## 2015-06-07 MED ORDER — 0.9 % SODIUM CHLORIDE (POUR BTL) OPTIME
TOPICAL | Status: DC | PRN
  Administered 2015-06-07: 1000 mL

## 2015-06-07 MED ORDER — ROCURONIUM BROMIDE 50 MG/5ML IV SOLN
INTRAVENOUS | Status: AC
  Filled 2015-06-07: qty 1

## 2015-06-07 MED ORDER — ARTIFICIAL TEARS OP OINT
TOPICAL_OINTMENT | OPHTHALMIC | Status: AC
  Filled 2015-06-07: qty 3.5

## 2015-06-07 MED ORDER — LIDOCAINE HCL (PF) 1 % IJ SOLN
INTRAMUSCULAR | Status: AC
  Filled 2015-06-07: qty 35

## 2015-06-07 MED ORDER — MIDAZOLAM HCL 5 MG/5ML IJ SOLN
INTRAMUSCULAR | Status: DC | PRN
  Administered 2015-06-07: 2 mg via INTRAVENOUS

## 2015-06-07 MED ORDER — PROPOFOL 10 MG/ML IV BOLUS
INTRAVENOUS | Status: DC | PRN
  Administered 2015-06-07 (×2): 50 mg via INTRAVENOUS
  Administered 2015-06-07: 150 mg via INTRAVENOUS

## 2015-06-07 MED ORDER — BUPIVACAINE HCL (PF) 0.5 % IJ SOLN
INTRAMUSCULAR | Status: AC
  Filled 2015-06-07: qty 30

## 2015-06-07 MED ORDER — LACTATED RINGERS IV SOLN
INTRAVENOUS | Status: DC | PRN
  Administered 2015-06-07: 1000 mL
  Administered 2015-06-07: 10:00:00 via INTRAVENOUS

## 2015-06-07 MED ORDER — PHENYLEPHRINE-KETOROLAC 1-0.3 % IO SOLN
INTRAOCULAR | Status: AC
  Filled 2015-06-07: qty 4

## 2015-06-07 MED ORDER — KETOROLAC TROMETHAMINE 30 MG/ML IJ SOLN
INTRAMUSCULAR | Status: AC
  Filled 2015-06-07: qty 1

## 2015-06-07 MED ORDER — NEOSTIGMINE METHYLSULFATE 10 MG/10ML IV SOLN
INTRAVENOUS | Status: DC | PRN
  Administered 2015-06-07: 4 mg via INTRAVENOUS

## 2015-06-07 MED ORDER — NEOSTIGMINE METHYLSULFATE 10 MG/10ML IV SOLN
INTRAVENOUS | Status: AC
  Filled 2015-06-07: qty 1

## 2015-06-07 MED ORDER — ROCURONIUM BROMIDE 100 MG/10ML IV SOLN
INTRAVENOUS | Status: DC | PRN
  Administered 2015-06-07: 5 mg via INTRAVENOUS
  Administered 2015-06-07: 35 mg via INTRAVENOUS
  Administered 2015-06-07: 5 mg via INTRAVENOUS

## 2015-06-07 MED ORDER — ONDANSETRON HCL 4 MG/2ML IJ SOLN
4.0000 mg | Freq: Once | INTRAMUSCULAR | Status: AC
  Administered 2015-06-07: 4 mg via INTRAVENOUS

## 2015-06-07 MED ORDER — MIDAZOLAM HCL 2 MG/2ML IJ SOLN
1.0000 mg | INTRAMUSCULAR | Status: DC | PRN
  Administered 2015-06-07: 2 mg via INTRAVENOUS

## 2015-06-07 MED ORDER — GLYCOPYRROLATE 0.2 MG/ML IJ SOLN
INTRAMUSCULAR | Status: AC
  Filled 2015-06-07: qty 3

## 2015-06-07 MED ORDER — ONDANSETRON HCL 4 MG/2ML IJ SOLN: 4.0000 mg | Freq: Once | INTRAMUSCULAR | Status: DC | PRN

## 2015-06-07 MED ORDER — DEXAMETHASONE SODIUM PHOSPHATE 4 MG/ML IJ SOLN
INTRAMUSCULAR | Status: AC
  Filled 2015-06-07: qty 1

## 2015-06-07 MED ORDER — LACTATED RINGERS IV SOLN: INTRAVENOUS | Status: DC

## 2015-06-07 MED ORDER — GLYCOPYRROLATE 0.2 MG/ML IJ SOLN
INTRAMUSCULAR | Status: AC
  Filled 2015-06-07: qty 2

## 2015-06-07 MED ORDER — CEFAZOLIN SODIUM-DEXTROSE 2-4 GM/100ML-% IV SOLN
2.0000 g | INTRAVENOUS | Status: AC
  Administered 2015-06-07: 2 g via INTRAVENOUS
  Filled 2015-06-07: qty 100

## 2015-06-07 MED ORDER — FENTANYL CITRATE (PF) 100 MCG/2ML IJ SOLN
INTRAMUSCULAR | Status: DC | PRN
  Administered 2015-06-07: 25 ug via INTRAVENOUS
  Administered 2015-06-07: 100 ug via INTRAVENOUS
  Administered 2015-06-07 (×2): 50 ug via INTRAVENOUS
  Administered 2015-06-07: 25 ug via INTRAVENOUS
  Administered 2015-06-07 (×2): 50 ug via INTRAVENOUS

## 2015-06-07 SURGICAL SUPPLY — 49 items
ABLATOR ENDOMETRIAL BIPOLAR (ABLATOR) ×3 IMPLANT
BAG HAMPER (MISCELLANEOUS) ×3 IMPLANT
BANDAGE STRIP 1X3 FLEXIBLE (GAUZE/BANDAGES/DRESSINGS) ×9 IMPLANT
BLADE SURG SZ11 CARB STEEL (BLADE) ×3 IMPLANT
CATH ROBINSON RED A/P 16FR (CATHETERS) ×3 IMPLANT
CLOSURE STERI-STRIP 1/4X4 (GAUZE/BANDAGES/DRESSINGS) ×3 IMPLANT
CLOTH BEACON ORANGE TIMEOUT ST (SAFETY) ×3 IMPLANT
COVER LIGHT HANDLE STERIS (MISCELLANEOUS) ×6 IMPLANT
DECANTER SPIKE VIAL GLASS SM (MISCELLANEOUS) ×3 IMPLANT
DURAPREP 26ML APPLICATOR (WOUND CARE) ×3 IMPLANT
ELECT REM PT RETURN 9FT ADLT (ELECTROSURGICAL) ×3
ELECTRODE REM PT RTRN 9FT ADLT (ELECTROSURGICAL) ×2 IMPLANT
FORMALIN 10 PREFIL 120ML (MISCELLANEOUS) ×6 IMPLANT
GLOVE BIOGEL PI IND STRL 7.0 (GLOVE) ×4 IMPLANT
GLOVE BIOGEL PI IND STRL 9 (GLOVE) ×2 IMPLANT
GLOVE BIOGEL PI INDICATOR 7.0 (GLOVE) ×2
GLOVE BIOGEL PI INDICATOR 9 (GLOVE) ×1
GLOVE ECLIPSE 6.5 STRL STRAW (GLOVE) ×3 IMPLANT
GLOVE ECLIPSE 7.0 STRL STRAW (GLOVE) ×3 IMPLANT
GLOVE ECLIPSE 9.0 STRL (GLOVE) ×6 IMPLANT
GLOVE SKINSENSE NS SZ6.5 (GLOVE) ×1
GLOVE SKINSENSE STRL SZ6.5 (GLOVE) ×2 IMPLANT
GOWN SPEC L3 XXLG W/TWL (GOWN DISPOSABLE) ×3 IMPLANT
GOWN STRL REUS W/TWL LRG LVL3 (GOWN DISPOSABLE) ×6 IMPLANT
INST SET HYSTEROSCOPY (KITS) ×3 IMPLANT
INST SET LAPROSCOPIC GYN AP (KITS) ×3 IMPLANT
IV NS IRRIG 3000ML ARTHROMATIC (IV SOLUTION) ×3 IMPLANT
KIT ROOM TURNOVER AP CYSTO (KITS) ×3 IMPLANT
KIT ROOM TURNOVER APOR (KITS) ×3 IMPLANT
MANIFOLD NEPTUNE II (INSTRUMENTS) ×3 IMPLANT
NEEDLE INSUFFLATION 120MM (ENDOMECHANICALS) ×3 IMPLANT
NS IRRIG 1000ML POUR BTL (IV SOLUTION) ×3 IMPLANT
PACK PERI GYN (CUSTOM PROCEDURE TRAY) ×3 IMPLANT
PAD ARMBOARD 7.5X6 YLW CONV (MISCELLANEOUS) ×3 IMPLANT
PAD TELFA 3X4 1S STER (GAUZE/BANDAGES/DRESSINGS) ×3 IMPLANT
SET BASIN LINEN APH (SET/KITS/TRAYS/PACK) ×3 IMPLANT
SET IRRIG TUBING LAPAROSCOPIC (IRRIGATION / IRRIGATOR) IMPLANT
SET IRRIG Y TYPE TUR BLADDER L (SET/KITS/TRAYS/PACK) ×3 IMPLANT
SHEARS HARMONIC ACE PLUS 36CM (ENDOMECHANICALS) ×3 IMPLANT
SLEEVE ENDOPATH XCEL 5M (ENDOMECHANICALS) ×6 IMPLANT
SOLUTION ANTI FOG 6CC (MISCELLANEOUS) ×3 IMPLANT
STRIP CLOSURE SKIN 1/4X3 (GAUZE/BANDAGES/DRESSINGS) ×3 IMPLANT
SUT VIC AB 4-0 PS2 27 (SUTURE) ×3 IMPLANT
SYR BULB IRRIGATION 50ML (SYRINGE) ×3 IMPLANT
SYR CONTROL 10ML LL (SYRINGE) ×3 IMPLANT
SYRINGE 10CC LL (SYRINGE) ×3 IMPLANT
TROCAR XCEL NON-BLD 5MMX100MML (ENDOMECHANICALS) ×3 IMPLANT
TUBING INSUFFLATION (TUBING) ×3 IMPLANT
WARMER LAPAROSCOPE (MISCELLANEOUS) ×3 IMPLANT

## 2015-06-07 NOTE — Op Note (Signed)
06/07/2015  11:06 AM  PATIENT:  [name]  30 y.o. female  PRE-OPERATIVE DIAGNOSIS:  menorrhagia sterlization request  POST-OPERATIVE DIAGNOSIS:  menorrhagia, desires permanent sterilization  PROCEDURE:  Procedure(s) with comments: DILATATION & CURETTAGE/HYSTEROSCOPY WITH NOVASURE ABLATION (N/A) - Uterine cavity length: 5.5 , uterine cavity width: 4.5,    power: 136, time: 1 minute 14 seconds. LAPAROSCOPIC BILATERAL SALPINGECTOMY (Bilateral)  SURGEON:  Surgeon(s) and Role:    * [name], MD - Primary  PHYSICIAN ASSISTANT:  ASSISTANTS: Witt CST, Tara student CST  ANESTHESIA:   local and paracervical block, general  EBL:  Total I/O In: 1400 [I.V.:1400] Out: 320 [Urine:300; Blood:20]  BLOOD ADMINISTERED:none DRAINS: none   LOCAL MEDICATIONS USED:  MARCAINE   20 cc paracervical block SPECIMEN:  No Specimen  DISPOSITION OF SPECIMEN:  N/A  COUNTS:  YES TOURNIQUET:  * No tourniquets in log * DICTATION: .Dragon Dictation  PLAN OF CARE: Discharge to home after PACU PATIENT DISPOSITION:  PACU - hemodynamically stable.  Delay start of Pharmacological VTE agent (>24hrs) due to surgical blood loss or risk of bleeding: not applicable  Details of procedure: Patient was taken operating room, prepped and draped for combined abdominal and vaginal procedure with timeout conducted, Ancef administered, and abdomen and perineum prepped and draped. In and out catheterization of the bladder was performed and the cervix was grasped on the anterior lip with single-tooth tenaculum. The Hulka tenaculum was not used attention was then directed to the abdomen where infraumbilical vertical 1 cm skin incision was made transverse suprapubic and right lower quadrant skin incisions of similar length were made, and then Veress needle was used through the umbilicus to achieve pneumoperitoneum with water droplet technique confirming intraperitoneal location, and insufflation under 7 mmHg  pressure performed to 3 L CO2. A 5 mm trochars were used at all 3 sites, with the umbilical trocar inserted under direct laparoscopic camera visualization and then suprapubic and right lower quadrant trochars inserted under direct intra-abdominal visualization. Attention then directed to the pelvis and with the patient in slight Trendelenburg position tubes and ovaries could be easily visualized. Ovaries were grossly normal. There was small paratubal cyst on the left side 1 cm size which was taken out as part of the specimen. Harmonic scalpel was used to transect the mesosalpinx and the specimens were extracted through the suprapubic port. Pedicles were inspected and confirmed as hemostatic visually, then saline 120 cc placed in the abdomen and the abdomen deflated, removed all carbon dioxide performed and then the final trochars removed and subcuticular 4-0 Vicryl close the skin incisions without difficulty. Sponge and needle counts were correct. Endometrial ablation. With this surgeon now sitting at the end of the table, speculum was inserted the cervix grasped and visualized easily,. The cervix was very irregular in contour due to obstetric lacerations from prior deliveries the uterus was sounded to 9-1/2 cm, was dilated easily in the anteflexed position to 25 [place] allowing introduction of the rigid 23 [place] 30 operative hysteroscope the visualization of both tubal ostia and normal appearing endometrial contours there was no significant tissue in the uterus and we did not obtain any specimen. Patient just finished her period in the endometrial cavity was essentially denuded. The NovaSure endometrial ablation device was prepared inserted uterus sounded to 9/2 cm as stated earlier, with 5-1/2 centimeter endometrial cavity length, and 4-1/2 cm endometrial cavity with upon application of the NovaSure endometrial ablation device the NovaSure device was then activated to its normal ablation sequence, then  extracted without difficulty. The cervix was inspected there were no lacerations requiring repair. \Sponge and needle counts were correct and patient to recovery room in good condition

## 2015-06-07 NOTE — Brief Op Note (Signed)
06/07/2015  11:06 AM  PATIENT:  Irma NewnessJacqueline Kaley  30 y.o. female  PRE-OPERATIVE DIAGNOSIS:  menorrhagia sterlization request  POST-OPERATIVE DIAGNOSIS:  menorrhagia, desires permanent sterilization  PROCEDURE:  Procedure(s) with comments: DILATATION & CURETTAGE/HYSTEROSCOPY WITH NOVASURE ABLATION (N/A) - Uterine cavity length: 5.5 , uterine cavity width: 4.5,    power: 136, time: 1 minute 14 seconds. LAPAROSCOPIC BILATERAL SALPINGECTOMY (Bilateral)  SURGEON:  Surgeon(s) and Role:    * Tilda BurrowJohn Isiaha Greenup V, MD - Primary  PHYSICIAN ASSISTANT:   ASSISTANTS: Witt CST, Tara student CST   ANESTHESIA:   local and paracervical block, general  EBL:  Total I/O In: 1400 [I.V.:1400] Out: 320 [Urine:300; Blood:20]  BLOOD ADMINISTERED:none  DRAINS: none   LOCAL MEDICATIONS USED:  MARCAINE   20 cc paracervical block  SPECIMEN:  No Specimen  DISPOSITION OF SPECIMEN:  N/A  COUNTS:  YES  TOURNIQUET:  * No tourniquets in log *  DICTATION: .Dragon Dictation  PLAN OF CARE: Discharge to home after PACU  PATIENT DISPOSITION:  PACU - hemodynamically stable.   Delay start of Pharmacological VTE agent (>24hrs) due to surgical blood loss or risk of bleeding: not applicable

## 2015-06-07 NOTE — Anesthesia Preprocedure Evaluation (Signed)
Anesthesia Evaluation  Patient identified by MRN, date of birth, ID band Patient awake    Reviewed: Allergy & Precautions, NPO status , Patient's Chart, lab work & pertinent test results  Airway Mallampati: I  TM Distance: >3 FB     Dental  (+) Teeth Intact   Pulmonary asthma , sleep apnea , Current Smoker,    breath sounds clear to auscultation       Cardiovascular negative cardio ROS   Rhythm:Regular Rate:Normal     Neuro/Psych    GI/Hepatic negative GI ROS,   Endo/Other    Renal/GU      Musculoskeletal   Abdominal   Peds  Hematology   Anesthesia Other Findings   Reproductive/Obstetrics                             Anesthesia Physical Anesthesia Plan  ASA: II  Anesthesia Plan: General   Post-op Pain Management:    Induction: Intravenous  Airway Management Planned: Oral ETT  Additional Equipment:   Intra-op Plan:   Post-operative Plan: Extubation in OR  Informed Consent:   Plan Discussed with:   Anesthesia Plan Comments:         Anesthesia Quick Evaluation

## 2015-06-07 NOTE — Anesthesia Procedure Notes (Signed)
Procedure Name: Intubation Date/Time: 06/07/2015 9:09 AM Performed by: Pernell DupreADAMS, Agape Hardiman A Pre-anesthesia Checklist: Patient identified, Patient being monitored, Timeout performed, Emergency Drugs available and Suction available Patient Re-evaluated:Patient Re-evaluated prior to inductionOxygen Delivery Method: Circle System Utilized Preoxygenation: Pre-oxygenation with 100% oxygen Intubation Type: IV induction Ventilation: Mask ventilation without difficulty Laryngoscope Size: 3 and Miller Grade View: Grade I Tube type: Oral Tube size: 7.0 mm Number of attempts: 1 Airway Equipment and Method: Stylet Placement Confirmation: ETT inserted through vocal cords under direct vision,  positive ETCO2 and breath sounds checked- equal and bilateral Secured at: 21 cm Tube secured with: Tape Dental Injury: Teeth and Oropharynx as per pre-operative assessment

## 2015-06-07 NOTE — Transfer of Care (Signed)
Immediate Anesthesia Transfer of Care Note  Patient: Irma NewnessJacqueline Olshefski  Procedure(s) Performed: Procedure(s) with comments: DILATATION & CURETTAGE/HYSTEROSCOPY WITH NOVASURE ABLATION (N/A) - Uterine cavity length: 5.5 , uterine cavity width: 4.5,    power: 136, time: 1 minute 14 seconds. LAPAROSCOPIC BILATERAL SALPINGECTOMY (Bilateral)  Patient Location: PACU  Anesthesia Type:General  Level of Consciousness: awake, oriented and patient cooperative  Airway & Oxygen Therapy: Patient Spontanous Breathing and Patient connected to face mask oxygen  Post-op Assessment: Report given to RN and Post -op Vital signs reviewed and stable  Post vital signs: Reviewed and stable  Last Vitals:  Filed Vitals:   06/07/15 0855 06/07/15 1022  BP: 134/70 129/65  Pulse:  75  Temp:  36.7 C  Resp: 16 20    Last Pain: There were no vitals filed for this visit.    Patients Stated Pain Goal: 7 (06/07/15 0736)  Complications: No apparent anesthesia complications

## 2015-06-07 NOTE — Anesthesia Postprocedure Evaluation (Signed)
Anesthesia Post Note  Patient: Irma NewnessJacqueline Foulkes  Procedure(s) Performed: Procedure(s) (LRB): DILATATION & CURETTAGE/HYSTEROSCOPY WITH NOVASURE ABLATION (N/A) LAPAROSCOPIC BILATERAL SALPINGECTOMY (Bilateral)  Patient location during evaluation: PACU Anesthesia Type: General Level of consciousness: awake and alert and oriented Pain management: pain level controlled Vital Signs Assessment: post-procedure vital signs reviewed and stable Respiratory status: spontaneous breathing and respiratory function stable Cardiovascular status: stable Postop Assessment: no signs of nausea or vomiting (Patient nauseated after extubation; Zofran given; nausea resolved) Anesthetic complications: no    Last Vitals:  Filed Vitals:   06/07/15 0855 06/07/15 1022  BP: 134/70 129/65  Pulse:  75  Temp:  36.7 C  Resp: 16 20    Last Pain: There were no vitals filed for this visit.               ADAMS, AMY A

## 2015-06-07 NOTE — Discharge Instructions (Signed)
Endometrial Ablation °Endometrial ablation removes the lining of the uterus (endometrium). It is usually a same-day, outpatient treatment. Ablation helps avoid major surgery, such as surgery to remove the cervix and uterus (hysterectomy). After endometrial ablation, you will have little or no menstrual bleeding and may not be able to have children. However, if you are premenopausal, you will need to use a reliable method of birth control following the procedure because of the small chance that pregnancy can occur. °There are different reasons to have this procedure. These reasons include: °· Heavy periods. °· Bleeding that is causing anemia. °· Irregular bleeding. °· Bleeding fibroids on the lining inside the uterus if they are smaller than 3 centimeters. °This procedure may not be possible for you if:  °· You want to have children in the future.   °· You have severe cramps with your menstrual period.   °· You have precancerous or cancerous cells in your uterus.   °· You were recently pregnant.   °· You have gone through menopause.   °· You have had major surgery on your uterus, resulting in thinning of the uterine wall. Surgeries may include: °¨ The removal of one or more uterine fibroids (myomectomy). °¨ A cesarean section with a classic (vertical) incision on your uterus. Ask your health care provider what type of cesarean you had. Sometimes the scar on your skin is different than the scar on your uterus. °Even if you have had surgery on your uterus, certain types of ablation may still be safe for you. Talk with your health care provider. °LET YOUR HEALTH CARE PROVIDER KNOW ABOUT: °· Any allergies you have. °· All medicines you are taking, including vitamins, herbs, eye drops, creams, and over-the-counter medicines. °· Previous problems you or members of your family have had with the use of anesthetics. °· Any blood disorders you have. °· Previous surgeries you have had. °· Medical conditions you have. °RISKS AND  COMPLICATIONS  °Generally, this is a safe procedure. However, as with any procedure, complications can occur. Possible complications include: °· Perforation of the uterus. °· Bleeding. °· Infection of the uterus, bladder, or vagina. °· Injury to surrounding organs. °· An air bubble to the lung (air embolus). °· Pregnancy following the procedure. °· Failure of the procedure to help the problem, requiring hysterectomy. °· Decreased ability to diagnose cancer in the lining of the uterus. °BEFORE THE PROCEDURE °· The lining of the uterus must be tested to make sure there is no pre-cancerous or cancer cells present. °· An ultrasound may be performed to look at the size of the uterus and to check for abnormalities. °· Medicines may be given to thin the lining of the uterus. °PROCEDURE  °During the procedure, your health care provider will use a tool called a resectoscope to help see inside your uterus. There are different ways to remove the lining of your uterus.  °· Radiofrequency - This method uses a radiofrequency-alternating electric current to remove the lining of the uterus. °· Cryotherapy - This method uses extreme cold to freeze the lining of the uterus. °· Heated-Free Liquid - This method uses heated salt (saline) solution to remove the lining of the uterus. °· Microwave - This method uses high-energy microwaves to heat up the lining of the uterus to remove it. °· Thermal balloon - This method involves inserting a catheter with a balloon tip into the uterus. The balloon tip is filled with heated fluid to remove the lining of the uterus. °AFTER THE PROCEDURE  °After your procedure, do   not have sexual intercourse or insert anything into your vagina until permitted by your health care provider. After the procedure, you may experience: °· Cramps. °· Vaginal discharge. °· Frequent urination. °  °This information is not intended to replace advice given to you by your health care provider. Make sure you discuss any  questions you have with your health care provider. °  °Document Released: 11/04/2003 Document Revised: 09/15/2014 Document Reviewed: 05/28/2012 °Elsevier Interactive Patient Education ©2016 [employer] ° °

## 2015-06-07 NOTE — Interval H&P Note (Signed)
History and Physical Interval Note:  06/07/2015 8:33 AM  Gina NewnessJacqueline Boller  has presented today for surgery, with the diagnosis of menorrhagia sterlization request  The various methods of treatment have been discussed with the patient and family. After consideration of risks, benefits and other options for treatment, the patient has consented to  Procedure(s): DILATATION & CURETTAGE/HYSTEROSCOPY WITH NOVASURE ABLATION (N/A) LAPAROSCOPIC BILATERAL SALPINGECTOMY (Bilateral) as a surgical intervention .  The patient's history has been reviewed, patient examined, no change in status, stable for surgery.  I have reviewed the patient's chart and labs.  Questions were answered to the patient's satisfaction.   The patient's Last Menses was last Sunday, 9 days ago, and the patient has remained sexually abstinent since delivery. Pro's and con's of procedure have been reviewed to pt satisfaction, and she has no further questions at this time. Normal postop course expectations have been reviewed.   Tilda BurrowFERGUSON,Dorothea Yow V

## 2015-06-08 ENCOUNTER — Encounter (HOSPITAL_COMMUNITY): Payer: Self-pay | Admitting: Obstetrics and Gynecology

## 2015-06-08 MED ORDER — SODIUM CHLORIDE 0.9 % IR SOLN
Status: DC | PRN
  Administered 2015-06-07: 3000 mL

## 2015-06-17 ENCOUNTER — Ambulatory Visit (INDEPENDENT_AMBULATORY_CARE_PROVIDER_SITE_OTHER): Payer: Medicaid Other | Admitting: Obstetrics and Gynecology

## 2015-06-17 ENCOUNTER — Encounter: Payer: Self-pay | Admitting: Obstetrics and Gynecology

## 2015-06-17 DIAGNOSIS — Z09 Encounter for follow-up examination after completed treatment for conditions other than malignant neoplasm: Secondary | ICD-10-CM

## 2015-06-17 NOTE — Progress Notes (Signed)
   Subjective:  [name] is a 30 y.o. female now 10 days status post D&C/hysteroscopy with novasure ablation. Laparoscopic bilateral salpingectomy.   Pt states that she has had minimal pain since the operation. Pt has associated symptoms of vaginal bleeding x 5 days. Pt reports that she has tried ibuprofen for the relief of her pain. Denies any other symptoms. Pt notes that she used to have heavy periods.   Review of Systems Negative except    Diet:    Bowel movements : normal.  The patient is not having any pain.  Objective:  LMP 05/28/2015 General:Well developed, well nourished.  No acute distress. Abdomen: Bowel sounds normal, soft, non-tender. Pelvic Exam:    External Genitalia:  Normal.    Vagina: Normal, moderate amount of watery pink-tinged fluid in vault, consistent with nl period.    Cervix: Normal    Uterus: Normal    Adnexa/Bimanual: Normal  Incision(s):   Healing well, no drainage, no erythema, no hernia, no swelling, no dehiscence,   Assessment:  Post-Op 10 weeks s/p D&C/hysteroscopy with novasure ablation. Laparoscopic bilateral salpingectomy.   Doing well postoperatively.   Plan:  1.Wound care discussed  2. . current medications. 3. Activity restrictions: no bending, stooping, or squatting, no lifting more than 25 pounds and no overhead lifting. No sexual activity until bleeding resolved.  4. return to work: not applicable. 5. Follow up in 3 months or PRN.  By signing my name below, I, Soijett Blue, attest that this documentation has been prepared under the direction and in the presence of Tilda BurrowJohn V Shanan Fitzpatrick, MD. Electronically Signed: Soijett Blue, ED Scribe. 06/17/2015. 11:08 AM.  I personally performed the services described in this documentation, which was SCRIBED in my presence. The recorded information has been reviewed and considered accurate. It has been edited as necessary during review. Tilda BurrowFERGUSON,Rosalene Wardrop V, MD

## 2015-06-19 DIAGNOSIS — Z09 Encounter for follow-up examination after completed treatment for conditions other than malignant neoplasm: Secondary | ICD-10-CM | POA: Insufficient documentation

## 2015-09-19 ENCOUNTER — Ambulatory Visit: Payer: Medicaid Other | Admitting: Obstetrics and Gynecology

## 2016-04-20 ENCOUNTER — Telehealth: Payer: Self-pay | Admitting: Physician Assistant

## 2016-04-20 ENCOUNTER — Other Ambulatory Visit: Payer: Self-pay | Admitting: Physician Assistant

## 2016-04-20 MED ORDER — MEBENDAZOLE 100 MG PO CHEW: 100.0000 mg | CHEWABLE_TABLET | Freq: Once | ORAL | 1 refills | Status: AC

## 2016-04-20 MED ORDER — MEBENDAZOLE 100 MG PO CHEW: 400.0000 mg | CHEWABLE_TABLET | Freq: Once | ORAL | 1 refills | Status: DC

## 2016-04-20 NOTE — Telephone Encounter (Signed)
Mom aware. Rx changed to Iver Mectin 3 mg per Lawanna Kobus.

## 2016-04-20 NOTE — Progress Notes (Unsigned)
Patient aware. Rx changed to Iver Mectin  per Yetta Barre.

## 2017-10-21 ENCOUNTER — Emergency Department (HOSPITAL_COMMUNITY): Payer: Medicaid Other

## 2017-10-21 ENCOUNTER — Inpatient Hospital Stay (HOSPITAL_COMMUNITY)
Admission: EM | Admit: 2017-10-21 | Discharge: 2017-10-25 | DRG: 464 | Payer: Medicaid Other | Attending: Orthopedic Surgery | Admitting: Orthopedic Surgery

## 2017-10-21 ENCOUNTER — Emergency Department (HOSPITAL_COMMUNITY): Payer: Medicaid Other | Admitting: Anesthesiology

## 2017-10-21 ENCOUNTER — Encounter (HOSPITAL_COMMUNITY)
Admission: EM | Disposition: A | Discharge status: Other Acute Inpatient Hospital | Payer: Self-pay | Source: Home / Self Care | Attending: Orthopedic Surgery

## 2017-10-21 ENCOUNTER — Encounter (HOSPITAL_COMMUNITY): Payer: Self-pay | Admitting: Emergency Medicine

## 2017-10-21 DIAGNOSIS — Z23 Encounter for immunization: Secondary | ICD-10-CM

## 2017-10-21 DIAGNOSIS — Z791 Long term (current) use of non-steroidal anti-inflammatories (NSAID): Secondary | ICD-10-CM

## 2017-10-21 DIAGNOSIS — S96122A Laceration of muscle and tendon of long extensor muscle of toe at ankle and foot level, left foot, initial encounter: Secondary | ICD-10-CM | POA: Diagnosis present

## 2017-10-21 DIAGNOSIS — S40011A Contusion of right shoulder, initial encounter: Secondary | ICD-10-CM | POA: Diagnosis present

## 2017-10-21 DIAGNOSIS — Z9049 Acquired absence of other specified parts of digestive tract: Secondary | ICD-10-CM

## 2017-10-21 DIAGNOSIS — S91122A Laceration with foreign body of left great toe without damage to nail, initial encounter: Secondary | ICD-10-CM | POA: Diagnosis present

## 2017-10-21 DIAGNOSIS — Y92410 Unspecified street and highway as the place of occurrence of the external cause: Secondary | ICD-10-CM

## 2017-10-21 DIAGNOSIS — J45909 Unspecified asthma, uncomplicated: Secondary | ICD-10-CM | POA: Diagnosis present

## 2017-10-21 DIAGNOSIS — G4733 Obstructive sleep apnea (adult) (pediatric): Secondary | ICD-10-CM | POA: Diagnosis present

## 2017-10-21 DIAGNOSIS — Z79891 Long term (current) use of opiate analgesic: Secondary | ICD-10-CM

## 2017-10-21 DIAGNOSIS — Z79899 Other long term (current) drug therapy: Secondary | ICD-10-CM

## 2017-10-21 DIAGNOSIS — S92532B Displaced fracture of distal phalanx of left lesser toe(s), initial encounter for open fracture: Principal | ICD-10-CM | POA: Diagnosis present

## 2017-10-21 DIAGNOSIS — Z6841 Body Mass Index (BMI) 40.0 and over, adult: Secondary | ICD-10-CM

## 2017-10-21 DIAGNOSIS — F17210 Nicotine dependence, cigarettes, uncomplicated: Secondary | ICD-10-CM | POA: Diagnosis present

## 2017-10-21 DIAGNOSIS — Z886 Allergy status to analgesic agent status: Secondary | ICD-10-CM

## 2017-10-21 DIAGNOSIS — S99922A Unspecified injury of left foot, initial encounter: Secondary | ICD-10-CM | POA: Diagnosis present

## 2017-10-21 DIAGNOSIS — Z7952 Long term (current) use of systemic steroids: Secondary | ICD-10-CM

## 2017-10-21 HISTORY — PX: I & D EXTREMITY: SHX5045

## 2017-10-21 LAB — COMPREHENSIVE METABOLIC PANEL
ALBUMIN: 4.5 g/dL (ref 3.5–5.0)
ALT: 34 U/L (ref 0–44)
AST: 31 U/L (ref 15–41)
Alkaline Phosphatase: 60 U/L (ref 38–126)
Anion gap: 13 (ref 5–15)
BILIRUBIN TOTAL: 0.7 mg/dL (ref 0.3–1.2)
BUN: 7 mg/dL (ref 6–20)
CO2: 19 mmol/L — ABNORMAL LOW (ref 22–32)
Calcium: 9.8 mg/dL (ref 8.9–10.3)
Chloride: 104 mmol/L (ref 98–111)
Creatinine, Ser: 0.84 mg/dL (ref 0.44–1.00)
GFR calc Af Amer: >60 mL/min (ref >60)
GFR calc non Af Amer: >60 mL/min (ref >60)
GLUCOSE: 119 mg/dL — AB (ref 70–99)
Potassium: 3.7 mmol/L (ref 3.5–5.1)
Sodium: 136 mmol/L (ref 135–145)
TOTAL PROTEIN: 7.8 g/dL (ref 6.5–8.1)

## 2017-10-21 LAB — URINALYSIS, ROUTINE W REFLEX MICROSCOPIC
Bilirubin Urine: NEGATIVE
GLUCOSE, UA: NEGATIVE mg/dL
Ketones, ur: 5 mg/dL — AB
NITRITE: NEGATIVE
PROTEIN: NEGATIVE mg/dL
Specific Gravity, Urine: 1.003 — ABNORMAL LOW (ref 1.005–1.030)
pH: 6 (ref 5.0–8.0)

## 2017-10-21 LAB — I-STAT CHEM 8, ED
BUN: 7 mg/dL (ref 6–20)
CREATININE: 0.6 mg/dL (ref 0.44–1.00)
Calcium, Ion: 1.27 mmol/L (ref 1.15–1.40)
Chloride: 103 mmol/L (ref 98–111)
Glucose, Bld: 120 mg/dL — ABNORMAL HIGH (ref 70–99)
HEMATOCRIT: 46 % (ref 36.0–46.0)
Hemoglobin: 15.6 g/dL — ABNORMAL HIGH (ref 12.0–15.0)
POTASSIUM: 4 mmol/L (ref 3.5–5.1)
SODIUM: 137 mmol/L (ref 135–145)
TCO2: 25 mmol/L (ref 22–32)

## 2017-10-21 LAB — PROTIME-INR
INR: 0.98
Prothrombin Time: 12.9 seconds (ref 11.4–15.2)

## 2017-10-21 LAB — CBC
HCT: 46.4 % — ABNORMAL HIGH (ref 36.0–46.0)
HEMOGLOBIN: 15.6 g/dL — AB (ref 12.0–15.0)
MCH: 32.2 pg (ref 26.0–34.0)
MCHC: 33.6 g/dL (ref 30.0–36.0)
MCV: 95.9 fL (ref 80.0–100.0)
Platelets: 393 10*3/uL (ref 150–400)
RBC: 4.84 MIL/uL (ref 3.87–5.11)
RDW: 12.2 % (ref 11.5–15.5)
WBC: 13.6 10*3/uL — ABNORMAL HIGH (ref 4.0–10.5)
nRBC: 0 % (ref 0.0–0.2)

## 2017-10-21 LAB — I-STAT BETA HCG BLOOD, ED (MC, WL, AP ONLY)

## 2017-10-21 LAB — SAMPLE TO BLOOD BANK

## 2017-10-21 LAB — I-STAT CG4 LACTIC ACID, ED: Lactic Acid, Venous: 5.01 mmol/L (ref 0.5–1.9)
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
## 2017-10-21 SURGERY — IRRIGATION AND DEBRIDEMENT EXTREMITY
Anesthesia: General | Site: Foot | Laterality: Left

## 2017-10-21 MED ORDER — CEFAZOLIN SODIUM-DEXTROSE 2-4 GM/100ML-% IV SOLN
2.0000 g | Freq: Once | INTRAVENOUS | Status: AC
  Administered 2017-10-21: 2 g via INTRAVENOUS
  Filled 2017-10-21: qty 100

## 2017-10-21 MED ORDER — HYDROMORPHONE HCL 1 MG/ML IJ SOLN
1.0000 mg | Freq: Once | INTRAMUSCULAR | Status: AC
  Administered 2017-10-21: 1 mg via INTRAVENOUS
  Filled 2017-10-21: qty 1

## 2017-10-21 MED ORDER — PHENYLEPHRINE 40 MCG/ML (10ML) SYRINGE FOR IV PUSH (FOR BLOOD PRESSURE SUPPORT)
PREFILLED_SYRINGE | INTRAVENOUS | Status: AC
  Filled 2017-10-21: qty 10

## 2017-10-21 MED ORDER — DEXAMETHASONE SODIUM PHOSPHATE 10 MG/ML IJ SOLN
INTRAMUSCULAR | Status: AC
  Filled 2017-10-21: qty 1

## 2017-10-21 MED ORDER — LIDOCAINE 2% (20 MG/ML) 5 ML SYRINGE
INTRAMUSCULAR | Status: DC | PRN
  Administered 2017-10-21: 50 mg via INTRAVENOUS

## 2017-10-21 MED ORDER — DEXAMETHASONE SODIUM PHOSPHATE 10 MG/ML IJ SOLN
INTRAMUSCULAR | Status: DC | PRN
  Administered 2017-10-21: 10 mg via INTRAVENOUS

## 2017-10-21 MED ORDER — ROCURONIUM BROMIDE 50 MG/5ML IV SOSY
PREFILLED_SYRINGE | INTRAVENOUS | Status: AC
  Filled 2017-10-21: qty 5

## 2017-10-21 MED ORDER — ONDANSETRON HCL 4 MG/2ML IJ SOLN
INTRAMUSCULAR | Status: AC
  Filled 2017-10-21: qty 2

## 2017-10-21 MED ORDER — SODIUM CHLORIDE 0.9 % IR SOLN
Status: DC | PRN
  Administered 2017-10-21 (×5): 3000 mL

## 2017-10-21 MED ORDER — LACTATED RINGERS IV SOLN
INTRAVENOUS | Status: DC | PRN
  Administered 2017-10-21 (×2): via INTRAVENOUS

## 2017-10-21 MED ORDER — IOHEXOL 300 MG/ML  SOLN
100.0000 mL | Freq: Once | INTRAMUSCULAR | Status: AC | PRN
  Administered 2017-10-21: 100 mL via INTRAVENOUS

## 2017-10-21 MED ORDER — PROPOFOL 10 MG/ML IV BOLUS
INTRAVENOUS | Status: AC
  Filled 2017-10-21: qty 20

## 2017-10-21 MED ORDER — SODIUM CHLORIDE 0.9 % IV SOLN
INTRAVENOUS | Status: DC | PRN
  Administered 2017-10-21: 21:00:00 via INTRAVENOUS

## 2017-10-21 MED ORDER — CEFAZOLIN SODIUM-DEXTROSE 2-3 GM-%(50ML) IV SOLR
INTRAVENOUS | Status: DC | PRN
  Administered 2017-10-21: 2 g via INTRAVENOUS

## 2017-10-21 MED ORDER — ONDANSETRON HCL 4 MG/2ML IJ SOLN
INTRAMUSCULAR | Status: DC | PRN
  Administered 2017-10-21: 4 mg via INTRAVENOUS

## 2017-10-21 MED ORDER — PHENYLEPHRINE 40 MCG/ML (10ML) SYRINGE FOR IV PUSH (FOR BLOOD PRESSURE SUPPORT)
PREFILLED_SYRINGE | INTRAVENOUS | Status: DC | PRN
  Administered 2017-10-21: 40 ug via INTRAVENOUS

## 2017-10-21 MED ORDER — FENTANYL CITRATE (PF) 100 MCG/2ML IJ SOLN
INTRAMUSCULAR | Status: DC | PRN
  Administered 2017-10-21 (×3): 50 ug via INTRAVENOUS
  Administered 2017-10-21: 100 ug via INTRAVENOUS
  Administered 2017-10-21 (×3): 50 ug via INTRAVENOUS

## 2017-10-21 MED ORDER — FENTANYL CITRATE (PF) 250 MCG/5ML IJ SOLN
INTRAMUSCULAR | Status: AC
  Filled 2017-10-21: qty 5

## 2017-10-21 MED ORDER — MIDAZOLAM HCL 5 MG/5ML IJ SOLN
INTRAMUSCULAR | Status: DC | PRN
  Administered 2017-10-21 (×2): 1 mg via INTRAVENOUS

## 2017-10-21 MED ORDER — 0.9 % SODIUM CHLORIDE (POUR BTL) OPTIME
TOPICAL | Status: DC | PRN
  Administered 2017-10-21: 1000 mL

## 2017-10-21 MED ORDER — FENTANYL CITRATE (PF) 100 MCG/2ML IJ SOLN
INTRAMUSCULAR | Status: AC | PRN
  Administered 2017-10-21 (×2): 50 ug via INTRAVENOUS

## 2017-10-21 MED ORDER — TETANUS-DIPHTH-ACELL PERTUSSIS 5-2.5-18.5 LF-MCG/0.5 IM SUSP
INTRAMUSCULAR | Status: AC
  Administered 2017-10-21: 0.5 mL via INTRAMUSCULAR
  Filled 2017-10-21: qty 0.5

## 2017-10-21 MED ORDER — SUCCINYLCHOLINE CHLORIDE 20 MG/ML IJ SOLN
INTRAMUSCULAR | Status: DC | PRN
  Administered 2017-10-21: 150 mg via INTRAVENOUS

## 2017-10-21 MED ORDER — SUCCINYLCHOLINE CHLORIDE 200 MG/10ML IV SOSY
PREFILLED_SYRINGE | INTRAVENOUS | Status: AC
  Filled 2017-10-21: qty 10

## 2017-10-21 MED ORDER — FENTANYL CITRATE (PF) 100 MCG/2ML IJ SOLN
INTRAMUSCULAR | Status: AC
  Filled 2017-10-21: qty 2

## 2017-10-21 MED ORDER — LIDOCAINE 2% (20 MG/ML) 5 ML SYRINGE
INTRAMUSCULAR | Status: AC
  Filled 2017-10-21: qty 5

## 2017-10-21 MED ORDER — MIDAZOLAM HCL 2 MG/2ML IJ SOLN
INTRAMUSCULAR | Status: AC
  Filled 2017-10-21: qty 2

## 2017-10-21 MED ORDER — ROCURONIUM BROMIDE 50 MG/5ML IV SOSY
PREFILLED_SYRINGE | INTRAVENOUS | Status: DC | PRN
  Administered 2017-10-21: 40 mg via INTRAVENOUS
  Administered 2017-10-21: 10 mg via INTRAVENOUS
  Administered 2017-10-21: 20 mg via INTRAVENOUS

## 2017-10-21 MED ORDER — PROPOFOL 10 MG/ML IV BOLUS
INTRAVENOUS | Status: DC | PRN
  Administered 2017-10-21: 20 mg via INTRAVENOUS
  Administered 2017-10-21: 180 mg via INTRAVENOUS

## 2017-10-21 MED ORDER — EPHEDRINE 5 MG/ML INJ
INTRAVENOUS | Status: AC
  Filled 2017-10-21: qty 10

## 2017-10-21 MED ORDER — CEFAZOLIN SODIUM-DEXTROSE 2-4 GM/100ML-% IV SOLN
INTRAVENOUS | Status: AC
  Filled 2017-10-21: qty 100

## 2017-10-21 SURGICAL SUPPLY — 46 items
BANDAGE ACE 4X5 VEL STRL LF (GAUZE/BANDAGES/DRESSINGS) ×2 IMPLANT
BANDAGE ACE 6X5 VEL STRL LF (GAUZE/BANDAGES/DRESSINGS) ×2 IMPLANT
BNDG COHESIVE 4X5 TAN STRL (GAUZE/BANDAGES/DRESSINGS) IMPLANT
BNDG GAUZE ELAST 4 BULKY (GAUZE/BANDAGES/DRESSINGS) ×2 IMPLANT
BOOTCOVER CLEANROOM LRG (PROTECTIVE WEAR) ×4 IMPLANT
COVER SURGICAL LIGHT HANDLE (MISCELLANEOUS) ×2 IMPLANT
COVER WAND RF STERILE (DRAPES) ×2 IMPLANT
CUFF TOURNIQUET SINGLE 34IN LL (TOURNIQUET CUFF) ×2 IMPLANT
DRSG PAD ABDOMINAL 8X10 ST (GAUZE/BANDAGES/DRESSINGS) ×6 IMPLANT
DURAPREP 26ML APPLICATOR (WOUND CARE) ×2 IMPLANT
ELECT REM PT RETURN 9FT ADLT (ELECTROSURGICAL)
ELECTRODE REM PT RTRN 9FT ADLT (ELECTROSURGICAL) IMPLANT
EVACUATOR 1/8 PVC DRAIN (DRAIN) IMPLANT
FACESHIELD WRAPAROUND (MASK) ×2 IMPLANT
GAUZE SPONGE 4X4 12PLY STRL (GAUZE/BANDAGES/DRESSINGS) ×2 IMPLANT
GAUZE SPONGE 4X4 16PLY XRAY LF (GAUZE/BANDAGES/DRESSINGS) ×2 IMPLANT
GAUZE XEROFORM 1X8 LF (GAUZE/BANDAGES/DRESSINGS) ×2 IMPLANT
GAUZE XEROFORM 5X9 LF (GAUZE/BANDAGES/DRESSINGS) ×4 IMPLANT
GLOVE BIOGEL PI ORTHO PRO SZ8 (GLOVE) ×2
GLOVE ORTHO TXT STRL SZ7.5 (GLOVE) ×2 IMPLANT
GLOVE PI ORTHO PRO STRL SZ8 (GLOVE) ×2 IMPLANT
GLOVE SURG ORTHO 7.0 STRL STRW (GLOVE) ×2 IMPLANT
GLOVE SURG ORTHO 8.0 STRL STRW (GLOVE) ×4 IMPLANT
GLOVE SURG SS PI 7.5 STRL IVOR (GLOVE) ×2 IMPLANT
GOWN STRL REUS W/ TWL LRG LVL3 (GOWN DISPOSABLE) IMPLANT
GOWN STRL REUS W/TWL LRG LVL3 (GOWN DISPOSABLE)
HANDPIECE INTERPULSE COAX TIP (DISPOSABLE)
KIT BASIN OR (CUSTOM PROCEDURE TRAY) ×2 IMPLANT
KIT TURNOVER KIT B (KITS) ×2 IMPLANT
MANIFOLD NEPTUNE II (INSTRUMENTS) ×2 IMPLANT
NS IRRIG 1000ML POUR BTL (IV SOLUTION) ×2 IMPLANT
PACK ORTHO EXTREMITY (CUSTOM PROCEDURE TRAY) ×2 IMPLANT
PAD ARMBOARD 7.5X6 YLW CONV (MISCELLANEOUS) ×4 IMPLANT
SET HNDPC FAN SPRY TIP SCT (DISPOSABLE) IMPLANT
SLING ARM IMMOBILIZER LRG (SOFTGOODS) ×2 IMPLANT
SPONGE LAP 18X18 X RAY DECT (DISPOSABLE) ×2 IMPLANT
STOCKINETTE IMPERVIOUS 9X36 MD (GAUZE/BANDAGES/DRESSINGS) ×2 IMPLANT
SUT ETHILON 3 0 PS 1 (SUTURE) ×8 IMPLANT
SUT PROLENE 4 0 PS 2 18 (SUTURE) ×2 IMPLANT
SWAB CULTURE ESWAB REG 1ML (MISCELLANEOUS) IMPLANT
TOWEL OR 17X24 6PK STRL BLUE (TOWEL DISPOSABLE) ×2 IMPLANT
TOWEL OR 17X26 10 PK STRL BLUE (TOWEL DISPOSABLE) ×2 IMPLANT
TUBE CONNECTING 12X1/4 (SUCTIONS) ×2 IMPLANT
UNDERPAD 30X30 (UNDERPADS AND DIAPERS) ×2 IMPLANT
WATER STERILE IRR 1000ML POUR (IV SOLUTION) ×2 IMPLANT
YANKAUER SUCT BULB TIP NO VENT (SUCTIONS) ×2 IMPLANT
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
## 2017-10-21 NOTE — Anesthesia Preprocedure Evaluation (Addendum)
Anesthesia Evaluation  Patient identified by MRN, date of birth, ID band Patient awake    Reviewed: Allergy & Precautions, NPO status , Patient's Chart, lab work & pertinent test results  Airway Mallampati: II  TM Distance: >3 FB Neck ROM: Full    Dental  (+) Teeth Intact, Dental Advisory Given   Pulmonary Current Smoker,    breath sounds clear to auscultation       Cardiovascular  Rhythm:Regular Rate:Normal     Neuro/Psych    GI/Hepatic   Endo/Other    Renal/GU      Musculoskeletal   Abdominal (+) + obese,   Peds  Hematology   Anesthesia Other Findings   Reproductive/Obstetrics                             Anesthesia Physical Anesthesia Plan  ASA: II and emergent  Anesthesia Plan: General   Post-op Pain Management:    Induction: Intravenous  PONV Risk Score and Plan: Ondansetron and Dexamethasone  Airway Management Planned: Oral ETT  Additional Equipment:   Intra-op Plan:   Post-operative Plan: Extubation in OR  Informed Consent: I have reviewed the patients History and Physical, chart, labs and discussed the procedure including the risks, benefits and alternatives for the proposed anesthesia with the patient or authorized representative who has indicated his/her understanding and acceptance.   Dental advisory given  Plan Discussed with: CRNA and Anesthesiologist  Anesthesia Plan Comments:         Anesthesia Quick Evaluation
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
## 2017-10-21 NOTE — Anesthesia Procedure Notes (Signed)
Procedure Name: Intubation Date/Time: 10/21/2017 9:29 PM Performed by: Edmonia Caprio, CRNA Pre-anesthesia Checklist: Patient identified, Suction available, Patient being monitored, Timeout performed and Emergency Drugs available Patient Re-evaluated:Patient Re-evaluated prior to induction Oxygen Delivery Method: Circle system utilized Preoxygenation: Pre-oxygenation with 100% oxygen Induction Type: IV induction, Rapid sequence and Cricoid Pressure applied Laryngoscope Size: Miller and 2 Grade View: Grade I Tube type: Oral Tube size: 7.0 mm Number of attempts: 1 Airway Equipment and Method: Stylet Placement Confirmation: positive ETCO2,  ETT inserted through vocal cords under direct vision and breath sounds checked- equal and bilateral Secured at: 21 cm Tube secured with: Tape Dental Injury: Teeth and Oropharynx as per pre-operative assessment
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
## 2017-10-22 ENCOUNTER — Other Ambulatory Visit: Payer: Self-pay

## 2017-10-22 ENCOUNTER — Encounter (HOSPITAL_COMMUNITY): Payer: Self-pay | Admitting: Orthopedic Surgery

## 2017-10-22 DIAGNOSIS — Y92410 Unspecified street and highway as the place of occurrence of the external cause: Secondary | ICD-10-CM | POA: Diagnosis not present

## 2017-10-22 DIAGNOSIS — J45909 Unspecified asthma, uncomplicated: Secondary | ICD-10-CM | POA: Diagnosis present

## 2017-10-22 DIAGNOSIS — S40011A Contusion of right shoulder, initial encounter: Secondary | ICD-10-CM | POA: Diagnosis present

## 2017-10-22 DIAGNOSIS — S91122A Laceration with foreign body of left great toe without damage to nail, initial encounter: Secondary | ICD-10-CM | POA: Diagnosis present

## 2017-10-22 DIAGNOSIS — G4733 Obstructive sleep apnea (adult) (pediatric): Secondary | ICD-10-CM | POA: Diagnosis present

## 2017-10-22 DIAGNOSIS — Z79899 Other long term (current) drug therapy: Secondary | ICD-10-CM | POA: Diagnosis not present

## 2017-10-22 DIAGNOSIS — S96122A Laceration of muscle and tendon of long extensor muscle of toe at ankle and foot level, left foot, initial encounter: Secondary | ICD-10-CM | POA: Diagnosis present

## 2017-10-22 DIAGNOSIS — Z23 Encounter for immunization: Secondary | ICD-10-CM | POA: Diagnosis not present

## 2017-10-22 DIAGNOSIS — S92532B Displaced fracture of distal phalanx of left lesser toe(s), initial encounter for open fracture: Secondary | ICD-10-CM | POA: Diagnosis present

## 2017-10-22 DIAGNOSIS — Z791 Long term (current) use of non-steroidal anti-inflammatories (NSAID): Secondary | ICD-10-CM | POA: Diagnosis not present

## 2017-10-22 DIAGNOSIS — F17210 Nicotine dependence, cigarettes, uncomplicated: Secondary | ICD-10-CM | POA: Diagnosis present

## 2017-10-22 DIAGNOSIS — Z886 Allergy status to analgesic agent status: Secondary | ICD-10-CM | POA: Diagnosis not present

## 2017-10-22 DIAGNOSIS — M79672 Pain in left foot: Secondary | ICD-10-CM | POA: Diagnosis present

## 2017-10-22 DIAGNOSIS — S99922A Unspecified injury of left foot, initial encounter: Secondary | ICD-10-CM | POA: Diagnosis present

## 2017-10-22 DIAGNOSIS — Z9049 Acquired absence of other specified parts of digestive tract: Secondary | ICD-10-CM | POA: Diagnosis not present

## 2017-10-22 DIAGNOSIS — Z7952 Long term (current) use of systemic steroids: Secondary | ICD-10-CM | POA: Diagnosis not present

## 2017-10-22 DIAGNOSIS — Z79891 Long term (current) use of opiate analgesic: Secondary | ICD-10-CM | POA: Diagnosis not present

## 2017-10-22 DIAGNOSIS — Z6841 Body Mass Index (BMI) 40.0 and over, adult: Secondary | ICD-10-CM | POA: Diagnosis not present

## 2017-10-22 LAB — CBC
HEMATOCRIT: 39.9 % (ref 36.0–46.0)
Hemoglobin: 13.4 g/dL (ref 12.0–15.0)
MCH: 31.8 pg (ref 26.0–34.0)
MCHC: 33.6 g/dL (ref 30.0–36.0)
MCV: 94.8 fL (ref 80.0–100.0)
Platelets: 298 10*3/uL (ref 150–400)
RBC: 4.21 MIL/uL (ref 3.87–5.11)
RDW: 12.1 % (ref 11.5–15.5)
WBC: 16.9 10*3/uL — AB (ref 4.0–10.5)
nRBC: 0 % (ref 0.0–0.2)

## 2017-10-22 LAB — CREATININE, SERUM
CREATININE: 0.73 mg/dL (ref 0.44–1.00)
GFR calc Af Amer: >60 mL/min (ref >60)

## 2017-10-22 LAB — ETHANOL: Alcohol, Ethyl (B): <10 mg/dL (ref <10)

## 2017-10-22 LAB — CDS SEROLOGY

## 2017-10-22 MED ORDER — KETOROLAC TROMETHAMINE 15 MG/ML IJ SOLN
INTRAMUSCULAR | Status: AC
  Administered 2017-10-22: 15 mg via INTRAVENOUS
  Filled 2017-10-22: qty 1

## 2017-10-22 MED ORDER — DIPHENHYDRAMINE HCL 12.5 MG/5ML PO ELIX: 12.5000 mg | ORAL_SOLUTION | ORAL | Status: DC | PRN

## 2017-10-22 MED ORDER — BISACODYL 10 MG RE SUPP: 10.0000 mg | Freq: Every day | RECTAL | Status: DC | PRN

## 2017-10-22 MED ORDER — ONDANSETRON HCL 4 MG PO TABS: 4.0000 mg | ORAL_TABLET | Freq: Four times a day (QID) | ORAL | Status: DC | PRN

## 2017-10-22 MED ORDER — OXYCODONE HCL 5 MG/5ML PO SOLN: 5.0000 mg | Freq: Once | ORAL | Status: DC | PRN

## 2017-10-22 MED ORDER — PNEUMOCOCCAL VAC POLYVALENT 25 MCG/0.5ML IJ INJ
0.5000 mL | INJECTION | INTRAMUSCULAR | Status: AC
  Administered 2017-10-24: 0.5 mL via INTRAMUSCULAR
  Filled 2017-10-22: qty 0.5

## 2017-10-22 MED ORDER — POLYETHYLENE GLYCOL 3350 17 G PO PACK: 17.0000 g | PACK | Freq: Every day | ORAL | Status: DC | PRN

## 2017-10-22 MED ORDER — METOCLOPRAMIDE HCL 5 MG PO TABS: 5.0000 mg | ORAL_TABLET | Freq: Three times a day (TID) | ORAL | Status: DC | PRN

## 2017-10-22 MED ORDER — DIAZEPAM 5 MG PO TABS
5.0000 mg | ORAL_TABLET | Freq: Four times a day (QID) | ORAL | Status: DC | PRN
  Administered 2017-10-23 – 2017-10-24 (×2): 5 mg via ORAL
  Filled 2017-10-22 (×2): qty 1

## 2017-10-22 MED ORDER — OXYCODONE HCL 5 MG PO TABS: 5.0000 mg | ORAL_TABLET | ORAL | 0 refills | Status: DC | PRN

## 2017-10-22 MED ORDER — ENOXAPARIN SODIUM 40 MG/0.4ML ~~LOC~~ SOLN
40.0000 mg | SUBCUTANEOUS | Status: DC
  Administered 2017-10-22 – 2017-10-24 (×3): 40 mg via SUBCUTANEOUS
  Filled 2017-10-22 (×3): qty 0.4

## 2017-10-22 MED ORDER — ZOLPIDEM TARTRATE 5 MG PO TABS: 5.0000 mg | ORAL_TABLET | Freq: Every evening | ORAL | Status: DC | PRN

## 2017-10-22 MED ORDER — KETOROLAC TROMETHAMINE 15 MG/ML IJ SOLN
15.0000 mg | Freq: Four times a day (QID) | INTRAMUSCULAR | Status: AC
  Administered 2017-10-22 (×4): 15 mg via INTRAVENOUS
  Filled 2017-10-22 (×4): qty 1

## 2017-10-22 MED ORDER — ONDANSETRON HCL 4 MG/2ML IJ SOLN: 4.0000 mg | Freq: Once | INTRAMUSCULAR | Status: DC | PRN

## 2017-10-22 MED ORDER — FENTANYL CITRATE (PF) 100 MCG/2ML IJ SOLN: 25.0000 ug | INTRAMUSCULAR | Status: DC | PRN

## 2017-10-22 MED ORDER — MAGNESIUM CITRATE PO SOLN: 1.0000 | Freq: Once | ORAL | Status: DC | PRN

## 2017-10-22 MED ORDER — SENNA-DOCUSATE SODIUM 8.6-50 MG PO TABS: 2.0000 | ORAL_TABLET | Freq: Every day | ORAL | 1 refills | Status: DC

## 2017-10-22 MED ORDER — DOCUSATE SODIUM 100 MG PO CAPS
100.0000 mg | ORAL_CAPSULE | Freq: Two times a day (BID) | ORAL | Status: DC
  Administered 2017-10-22 – 2017-10-25 (×7): 100 mg via ORAL
  Filled 2017-10-22 (×8): qty 1

## 2017-10-22 MED ORDER — DOXYCYCLINE HYCLATE 50 MG PO CAPS: 50.0000 mg | ORAL_CAPSULE | Freq: Two times a day (BID) | ORAL | 0 refills | Status: DC

## 2017-10-22 MED ORDER — OXYCODONE HCL 5 MG PO TABS
10.0000 mg | ORAL_TABLET | ORAL | Status: DC | PRN
  Administered 2017-10-22: 10 mg via ORAL
  Administered 2017-10-22 – 2017-10-25 (×10): 15 mg via ORAL
  Filled 2017-10-22 (×7): qty 3
  Filled 2017-10-22 (×2): qty 2
  Filled 2017-10-22 (×3): qty 3

## 2017-10-22 MED ORDER — CEFAZOLIN SODIUM-DEXTROSE 2-4 GM/100ML-% IV SOLN
2.0000 g | Freq: Four times a day (QID) | INTRAVENOUS | Status: AC
  Administered 2017-10-22 – 2017-10-24 (×12): 2 g via INTRAVENOUS
  Filled 2017-10-22 (×12): qty 100

## 2017-10-22 MED ORDER — PRENATAL PLUS 27-1 MG PO TABS
1.0000 | ORAL_TABLET | Freq: Every day | ORAL | Status: DC
  Administered 2017-10-22 – 2017-10-23 (×2): 1 via ORAL
  Filled 2017-10-22 (×4): qty 1

## 2017-10-22 MED ORDER — POTASSIUM CHLORIDE IN NACL 20-0.45 MEQ/L-% IV SOLN
INTRAVENOUS | Status: DC
  Administered 2017-10-22: 01:00:00 via INTRAVENOUS
  Filled 2017-10-22 (×2): qty 1000

## 2017-10-22 MED ORDER — ACETAMINOPHEN 325 MG PO TABS
325.0000 mg | ORAL_TABLET | Freq: Four times a day (QID) | ORAL | Status: DC | PRN
  Administered 2017-10-24: 650 mg via ORAL
  Filled 2017-10-22: qty 2

## 2017-10-22 MED ORDER — HYDROMORPHONE HCL 1 MG/ML IJ SOLN
0.5000 mg | INTRAMUSCULAR | Status: DC | PRN
  Administered 2017-10-22 – 2017-10-25 (×9): 1 mg via INTRAVENOUS
  Filled 2017-10-22 (×9): qty 1

## 2017-10-22 MED ORDER — SUGAMMADEX SODIUM 200 MG/2ML IV SOLN
INTRAVENOUS | Status: DC | PRN
  Administered 2017-10-22: 200 mg via INTRAVENOUS

## 2017-10-22 MED ORDER — ACETAMINOPHEN 500 MG PO TABS
1000.0000 mg | ORAL_TABLET | Freq: Four times a day (QID) | ORAL | Status: AC
  Administered 2017-10-22 (×4): 1000 mg via ORAL
  Filled 2017-10-22 (×4): qty 2

## 2017-10-22 MED ORDER — ONDANSETRON HCL 4 MG/2ML IJ SOLN: 4.0000 mg | Freq: Four times a day (QID) | INTRAMUSCULAR | Status: DC | PRN

## 2017-10-22 MED ORDER — INFLUENZA VAC SPLIT QUAD 0.5 ML IM SUSY
0.5000 mL | PREFILLED_SYRINGE | INTRAMUSCULAR | Status: AC
  Administered 2017-10-24: 0.5 mL via INTRAMUSCULAR
  Filled 2017-10-22: qty 0.5

## 2017-10-22 MED ORDER — OXYCODONE HCL 5 MG PO TABS
5.0000 mg | ORAL_TABLET | ORAL | Status: DC | PRN
  Administered 2017-10-22 – 2017-10-23 (×2): 10 mg via ORAL
  Filled 2017-10-22 (×2): qty 2

## 2017-10-22 MED ORDER — METOCLOPRAMIDE HCL 5 MG/ML IJ SOLN: 5.0000 mg | Freq: Three times a day (TID) | INTRAMUSCULAR | Status: DC | PRN

## 2017-10-22 MED ORDER — GABAPENTIN 300 MG PO CAPS
300.0000 mg | ORAL_CAPSULE | Freq: Three times a day (TID) | ORAL | Status: DC
  Administered 2017-10-22 – 2017-10-25 (×11): 300 mg via ORAL
  Filled 2017-10-22 (×11): qty 1

## 2017-10-22 MED ORDER — METHOCARBAMOL 500 MG PO TABS
500.0000 mg | ORAL_TABLET | Freq: Four times a day (QID) | ORAL | Status: DC | PRN
  Administered 2017-10-23 – 2017-10-25 (×5): 500 mg via ORAL
  Filled 2017-10-22 (×5): qty 1

## 2017-10-22 MED ORDER — OXYCODONE HCL 5 MG PO TABS: 5.0000 mg | ORAL_TABLET | Freq: Once | ORAL | Status: DC | PRN

## 2017-10-22 MED ORDER — METHOCARBAMOL 1000 MG/10ML IJ SOLN
500.0000 mg | Freq: Four times a day (QID) | INTRAVENOUS | Status: DC | PRN
  Administered 2017-10-22: 500 mg via INTRAVENOUS
  Filled 2017-10-22 (×2): qty 5
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
## 2017-10-22 NOTE — Transfer of Care (Signed)
Immediate Anesthesia Transfer of Care Note  Patient: [name]  Procedure(s) Performed: 1.LEFT 2ND TOE DORSUM FOREIGN BODY, IRRIGATION AND DEBRIDEMENT BONE, EXTENSOR TENDOR LACERATION. 2.SECOND TOE PLANAR 2 CM X 1.75 CM WOUND WITH BONE,TISSUE LOSS. 3. GREAT TOE 0.5 CM LACERATION 4. THIRD TOE OPEN FRACTURE DISTAL PHALANX (DIP) JOINT 5. FOURTH TOE PLANTAR 3 CM WOUND WITH TENDON, BONE EXPOSURE PARTIAL EXCISION DISTAL PHALANX  6. 2 X 2 LACERATION PLANTAR FIRST MTP 7. 10 CM X  4 CM MEDIAL FOOT WOUND WITH PLANTAR FASCIA, MUSCLE, TENDON INVOLVEMENT TO FIRST METATARSAL8. 3.5 X 3 CM HEEL WO (Left Foot)  Patient Location: PACU  Anesthesia Type:General  Level of Consciousness: awake and alert   Airway & Oxygen Therapy: Patient Spontanous Breathing and Patient connected to face mask oxygen  Post-op Assessment: Report given to RN and Post -op Vital signs reviewed and stable  Post vital signs: Reviewed and stable  Last Vitals:  Vitals Value Taken Time  BP 120/74 10/22/2017 12:29 AM  Temp    Pulse 116 10/22/2017 12:32 AM  Resp 15 10/22/2017 12:32 AM  SpO2 100 % 10/22/2017 12:32 AM  Vitals shown include unvalidated device data.  Last Pain:  Vitals:   10/21/17 1700  PainSc: 10-Worst pain ever         Complications: No apparent anesthesia complications
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
## 2017-10-22 NOTE — Anesthesia Procedure Notes (Addendum)
Anesthesia Regional Block: Popliteal block   Pre-Anesthetic Checklist: ,, timeout performed, Correct Patient, Correct Site, Correct Laterality, Correct Procedure, Correct Position, site marked, Risks and benefits discussed,  Surgical consent,  Pre-op evaluation,  At surgeon's request and post-op pain management  Laterality: Left  Prep: Maximum Sterile Barrier Precautions used, chloraprep       Needles:  Injection technique: Single-shot  Needle Type: Stimulator Needle - 40     Needle Length: 9cm  Needle Gauge: 22     Additional Needles:   Procedures:, nerve stimulator,,, ultrasound used (permanent image in chart),,,,  Narrative:  Start time: 10/22/2017 12:00 AM End time: 10/22/2017 12:10 AM Injection made incrementally with aspirations every 5 mL.  Performed by: Personally  Anesthesiologist: Kipp Brood, MD  Additional Notes: 25 cc 0.75% Bupivacaine with 1:200 epi and 0.3 mg clonidine injected easily

## 2017-10-22 NOTE — Progress Notes (Signed)
     Subjective:  Patient reports pain as mild.  Block still working.  Has pain in shoulder.  Objective:   VITALS:   Vitals:   10/22/17 0130 10/22/17 0148 10/22/17 0647 10/22/17 0827  BP: 123/63 (!) 106/50 (!) 98/49 (!) 123/56  Pulse: 76 78 69 65  Resp: (!) 22 18 16 16   Temp: 98.1 F (36.7 C)  97.8 F (36.6 C) 98.2 F (36.8 C)  TempSrc:   Oral Oral  SpO2: 97% 98% 99% 100%  Weight:      Height:       Right shoulder with ecchymosis.  Left foot dressing intact.  Lab Results  Component Value Date   WBC 16.9 (H) 10/22/2017   HGB 13.4 10/22/2017   HCT 39.9 10/22/2017   MCV 94.8 10/22/2017   PLT 298 10/22/2017   BMET    Component Value Date/Time   NA 137 10/21/2017 1705   NA 139 08/19/2012 1020   K 4.0 10/21/2017 1705   CL 103 10/21/2017 1705   CO2 19 (L) 10/21/2017 1647   GLUCOSE 120 (H) 10/21/2017 1705   BUN 7 10/21/2017 1705   BUN 11 08/19/2012 1020   CREATININE 0.73 10/22/2017 0103   CALCIUM 9.8 10/21/2017 1647   GFRNONAA >60 10/22/2017 0103   GFRAA >60 10/22/2017 0103     Assessment/Plan: 1 Day Post-Op   Active Problems:   Foot trauma, left, initial encounter   1.  Left great toe 0.5 cm laceration of skin and subcutaneous tissue 2.  Left second toe dorsal laceration with exposed skin, subcutaneous tissue, and bone 3.  Left second toe dorsal retained foreign body, glass 4.  Left second toe dorsal partial extensor tendon laceration 5.  Left second toe plantar 2 cm x 1.75 cm wound with contaminated distal phalanx and soft tissue loss 6.  Left third toe open fracture of the distal phalanx and distal interphalangeal joint with 3 cm wound 7.  Fourth toe plantar 3 cm wound with exposed tendon and bone and contaminated distal phalanx 8.  2 x 2 centimeter laceration over the plantar medial first metatarsal phalangeal joint involving skin and subcutaneous tissue and fascia 9.  10 x 4 cm medial foot wound with involvement of the plantar fascia and muscle with  substantial gross contamination extending down to the level of the first metatarsal 10.  3.5 cm x 3.5 cm posterior medial heel pad wound involving skin and subcutaneous tissue and fascia  IV antibiotics, mobilize, dc foley, plan for dc home tomorrow if mobility improves and pain controlled.   Body mass index is 42.29 kg/m. Morbid obesity.  Eulas Post 10/22/2017, 1:04 PM   Teryl Lucy, MD Cell [phone]

## 2017-10-22 NOTE — Plan of Care (Signed)

## 2017-10-22 NOTE — Plan of Care (Signed)
  Problem: Education: Goal: Knowledge of General Education information will improve Description Including pain rating scale, medication(s)/side effects and non-pharmacologic comfort measures Outcome: Progressing   Problem: Health Behavior/Discharge Planning: Goal: Ability to manage health-related needs will improve Outcome: Progressing   

## 2017-10-22 NOTE — Progress Notes (Signed)
D; c/o pain at middle toe, and Rt shoulder. Dr. Noreene Larsson gave 80mg  of precedex via IV, pt. Fell in sleep

## 2017-10-22 NOTE — Anesthesia Procedure Notes (Signed)
Anesthesia Regional Block: Adductor canal block   Pre-Anesthetic Checklist: ,, timeout performed, Correct Patient, Correct Site, Correct Laterality, Correct Procedure, Correct Position, site marked, Risks and benefits discussed,  Surgical consent,  Pre-op evaluation,  At surgeon's request and post-op pain management  Laterality: Right  Prep: Maximum Sterile Barrier Precautions used, chloraprep       Needles:  Injection technique: Single-shot  Needle Type: Echogenic Stimulator Needle     Needle Length: 9cm  Needle Gauge: 21     Additional Needles:   Procedures:,,,, ultrasound used (permanent image in chart),,,,  Narrative:  Start time: 10/22/2017 12:00 AM End time: 10/22/2017 12:10 AM Injection made incrementally with aspirations every 5 mL.  Performed by: Personally  Anesthesiologist: Kipp Brood, MD  Additional Notes: 15 cc 0.5% Bupivacaine with 1:200 epi injected easily

## 2017-10-23 NOTE — Progress Notes (Addendum)
Patient ID: [name], female   DOB: 04-May-1985, 32 y.o.   MRN: [id number]     Subjective:  Patient reports pain as moderate to severe.  Patient in bed and in no acute distress and denies any CP or SOB.  Patient not crying before I entered the room but started crying just as soon as I entered also requesting more pain medication  Objective:   VITALS:   Vitals:   10/22/17 0827 10/22/17 1535 10/22/17 2129 10/23/17 0612  BP: (!) 123/56 (!) 125/52 116/64 109/70  Pulse: 65 77 68 62  Resp: 16 17 16 15   Temp: 98.2 F (36.8 C) 98.4 F (36.9 C) 97.8 F (36.6 C) 98 F (36.7 C)  TempSrc: Oral Oral Oral Oral  SpO2: 100% 98% 99% 99%  Weight:      Height:        ABD soft Sensation intact distally Dorsiflexion/Plantar flexion intact Incision: dressing C/D/I and scant drainage Dressing changed and wound is good  Lab Results  Component Value Date   WBC 16.9 (H) 10/22/2017   HGB 13.4 10/22/2017   HCT 39.9 10/22/2017   MCV 94.8 10/22/2017   PLT 298 10/22/2017   BMET    Component Value Date/Time   NA 137 10/21/2017 1705   NA 139 08/19/2012 1020   K 4.0 10/21/2017 1705   CL 103 10/21/2017 1705   CO2 19 (L) 10/21/2017 1647   GLUCOSE 120 (H) 10/21/2017 1705   BUN 7 10/21/2017 1705   BUN 11 08/19/2012 1020   CREATININE 0.73 10/22/2017 0103   CALCIUM 9.8 10/21/2017 1647   GFRNONAA >60 10/22/2017 0103   GFRAA >60 10/22/2017 0103     Assessment/Plan: 2 Days Post-Op   Active Problems:   Foot trauma, left, initial encounter   Advance diet Up with therapy Plan for DC home tomorrow  Patient unable to manage pain and mobilize NWB left lower ext Post op shoe  Follow up with Dr Dion Saucier next week    Torrie Mayers, Apolinar Junes 10/23/2017, 11:06 AM  Discussed and agree with above. Still poor mobility.   Teryl Lucy, MD Cell [phone]
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
## 2017-10-24 NOTE — Plan of Care (Signed)
  Problem: Pain Managment: Goal: General experience of comfort will improve Outcome: Progressing   

## 2017-10-24 NOTE — Progress Notes (Signed)
CSW attempted to complete SBIRT, patient was in pain and seeing PT, unable to speak at this time.   Will revisit later today.   [place], [place] [phone]

## 2017-10-24 NOTE — Plan of Care (Signed)
  Problem: Coping: Goal: Level of anxiety will decrease Outcome: Progressing   Problem: Clinical Measurements: Goal: Ability to maintain clinical measurements within normal limits will improve Outcome: Progressing   Problem: Pain Managment: Goal: General experience of comfort will improve Outcome: Progressing   
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
## 2017-10-24 NOTE — Progress Notes (Signed)
Inpatient Rehabilitation  Per PT and OT request, patient was re-screened by Fae Pippin for appropriateness for an Inpatient Acute Rehab consult.  Note ongoing pain with therapy that is limiting her ability to discharge.  At this time we are recommending an Inpatient Rehab consult.  Notified Dr. Shelba Flake office of request.  Please order if you are agreeable.    Charlane Ferretti., CCC/SLP Admission Coordinator  [facility] Inpatient Rehabilitation  Cell [phone]
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
## 2017-10-24 NOTE — Progress Notes (Addendum)
Patient ID: [name], female   DOB: [birth year], 32 y.o.   MRN: [id number]     Subjective:  Patient reports pain as mild to moderate.  Patient reports that the pain is better today she is no longer tearful in bed  Objective:   VITALS:   Vitals:   10/23/17 0612 10/23/17 1426 10/23/17 2115 10/24/17 0600  BP: 109/70 125/87 130/72 99/70  Pulse: 62 64 79 60  Resp: 15 18 17 14   Temp: 98 F (36.7 C) 98.2 F (36.8 C) 97.8 F (36.6 C) 97.9 F (36.6 C)  TempSrc: Oral Oral Oral Oral  SpO2: 99% 100% 97% 100%  Weight:      Height:        ABD soft Sensation intact distally Dorsiflexion/Plantar flexion intact Incision: dressing C/D/I and no drainage   Lab Results  Component Value Date   WBC 16.9 (H) 10/22/2017   HGB 13.4 10/22/2017   HCT 39.9 10/22/2017   MCV 94.8 10/22/2017   PLT 298 10/22/2017   BMET    Component Value Date/Time   NA 137 10/21/2017 1705   NA 139 08/19/2012 1020   K 4.0 10/21/2017 1705   CL 103 10/21/2017 1705   CO2 19 (L) 10/21/2017 1647   GLUCOSE 120 (H) 10/21/2017 1705   BUN 7 10/21/2017 1705   BUN 11 08/19/2012 1020   CREATININE 0.73 10/22/2017 0103   CALCIUM 9.8 10/21/2017 1647   GFRNONAA >60 10/22/2017 0103   GFRAA >60 10/22/2017 0103     Assessment/Plan: 3 Days Post-Op   Active Problems:   Foot trauma, left, initial encounter   Advance diet Up with therapy NWB left lower ext Dry dressing PRN Post op shoe when up  Plan for DC today if mobilized with PT    [name], [name] 10/24/2017, 6:54 AM  Discussed and agree with above.   Teryl Lucy, MD Cell [phone]
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
## 2017-10-24 NOTE — Anesthesia Postprocedure Evaluation (Signed)
Anesthesia Post Note  Patient: Coren Crownover  Procedure(s) Performed: 1.LEFT 2ND TOE DORSUM FOREIGN BODY, IRRIGATION AND DEBRIDEMENT BONE, EXTENSOR TENDOR LACERATION. 2.SECOND TOE PLANAR 2 CM X 1.75 CM WOUND WITH BONE,TISSUE LOSS. 3. GREAT TOE 0.5 CM LACERATION 4. THIRD TOE OPEN FRACTURE DISTAL PHALANX (DIP) JOINT 5. FOURTH TOE PLANTAR 3 CM WOUND WITH TENDON, BONE EXPOSURE PARTIAL EXCISION DISTAL PHALANX  6. 2 X 2 LACERATION PLANTAR FIRST MTP 7. 10 CM X  4 CM MEDIAL FOOT WOUND WITH PLANTAR FASCIA, MUSCLE, TENDON INVOLVEMENT TO FIRST METATARSAL8. 3.5 X 3 CM HEEL WO (Left Foot)     Patient location during evaluation: PACU Anesthesia Type: General Level of consciousness: awake and alert Pain management: pain level controlled Vital Signs Assessment: post-procedure vital signs reviewed and stable Respiratory status: spontaneous breathing, nonlabored ventilation, respiratory function stable and patient connected to nasal cannula oxygen Cardiovascular status: blood pressure returned to baseline and stable Postop Assessment: no apparent nausea or vomiting Anesthetic complications: no    Last Vitals:  Vitals:   10/24/17 0600 10/24/17 1338  BP: 99/70 (!) 126/47  Pulse: 60 61  Resp: 14 18  Temp: 36.6 C 36.8 C  SpO2: 100% 99%    Last Pain:  Vitals:   10/24/17 1648  TempSrc:   PainSc: Asleep                 Nesreen Albano COKER

## 2017-10-24 NOTE — Progress Notes (Signed)
Inpatient Rehabilitation  Per OT request, patient was screened by Fae Pippin for appropriateness for an Inpatient Acute Rehab consult.  At this time note that pain is improved and anticipate that patient will discharge today after PT.  If questions please call.    Charlane Ferretti., CCC/SLP Admission Coordinator  [facility] Inpatient Rehabilitation  Cell [phone]
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
## 2017-10-25 ENCOUNTER — Encounter (HOSPITAL_COMMUNITY): Payer: Self-pay | Admitting: Emergency Medicine

## 2017-10-25 ENCOUNTER — Encounter (HOSPITAL_COMMUNITY): Payer: Self-pay | Admitting: Physical Medicine and Rehabilitation

## 2017-10-25 ENCOUNTER — Inpatient Hospital Stay (HOSPITAL_COMMUNITY)
Admission: RE | Admit: 2017-10-25 | Discharge: 2017-11-05 | DRG: 945 | Disposition: A | Discharge status: Home Health | Payer: Medicaid Other | Source: Intra-hospital | Attending: Physical Medicine & Rehabilitation | Admitting: Physical Medicine & Rehabilitation

## 2017-10-25 ENCOUNTER — Other Ambulatory Visit: Payer: Self-pay

## 2017-10-25 DIAGNOSIS — G4733 Obstructive sleep apnea (adult) (pediatric): Secondary | ICD-10-CM | POA: Diagnosis present

## 2017-10-25 DIAGNOSIS — F17210 Nicotine dependence, cigarettes, uncomplicated: Secondary | ICD-10-CM | POA: Diagnosis present

## 2017-10-25 DIAGNOSIS — T1490XA Injury, unspecified, initial encounter: Secondary | ICD-10-CM | POA: Diagnosis present

## 2017-10-25 DIAGNOSIS — D62 Acute posthemorrhagic anemia: Secondary | ICD-10-CM | POA: Diagnosis present

## 2017-10-25 DIAGNOSIS — S40011D Contusion of right shoulder, subsequent encounter: Secondary | ICD-10-CM

## 2017-10-25 DIAGNOSIS — F419 Anxiety disorder, unspecified: Secondary | ICD-10-CM | POA: Diagnosis present

## 2017-10-25 DIAGNOSIS — Z6841 Body Mass Index (BMI) 40.0 and over, adult: Secondary | ICD-10-CM | POA: Diagnosis not present

## 2017-10-25 DIAGNOSIS — R7401 Elevation of levels of liver transaminase levels: Secondary | ICD-10-CM

## 2017-10-25 DIAGNOSIS — Z886 Allergy status to analgesic agent status: Secondary | ICD-10-CM

## 2017-10-25 DIAGNOSIS — E8809 Other disorders of plasma-protein metabolism, not elsewhere classified: Secondary | ICD-10-CM

## 2017-10-25 DIAGNOSIS — E46 Unspecified protein-calorie malnutrition: Secondary | ICD-10-CM | POA: Diagnosis present

## 2017-10-25 DIAGNOSIS — S91312D Laceration without foreign body, left foot, subsequent encounter: Principal | ICD-10-CM

## 2017-10-25 DIAGNOSIS — M79672 Pain in left foot: Secondary | ICD-10-CM

## 2017-10-25 DIAGNOSIS — Z720 Tobacco use: Secondary | ICD-10-CM

## 2017-10-25 DIAGNOSIS — Z9049 Acquired absence of other specified parts of digestive tract: Secondary | ICD-10-CM

## 2017-10-25 DIAGNOSIS — S99922S Unspecified injury of left foot, sequela: Secondary | ICD-10-CM

## 2017-10-25 DIAGNOSIS — G8918 Other acute postprocedural pain: Secondary | ICD-10-CM

## 2017-10-25 DIAGNOSIS — S40011A Contusion of right shoulder, initial encounter: Secondary | ICD-10-CM

## 2017-10-25 DIAGNOSIS — R740 Nonspecific elevation of levels of transaminase and lactic acid dehydrogenase [LDH]: Secondary | ICD-10-CM

## 2017-10-25 DIAGNOSIS — E876 Hypokalemia: Secondary | ICD-10-CM

## 2017-10-25 MED ORDER — FLEET ENEMA 7-19 GM/118ML RE ENEM: 1.0000 | ENEMA | Freq: Once | RECTAL | Status: DC | PRN

## 2017-10-25 MED ORDER — DICLOFENAC SODIUM 1 % TD GEL
4.0000 g | Freq: Four times a day (QID) | TRANSDERMAL | Status: DC
  Administered 2017-10-25 – 2017-11-04 (×33): 4 g via TOPICAL
  Filled 2017-10-25: qty 100

## 2017-10-25 MED ORDER — ENOXAPARIN SODIUM 40 MG/0.4ML ~~LOC~~ SOLN
40.0000 mg | SUBCUTANEOUS | Status: DC
  Administered 2017-10-25 – 2017-11-04 (×11): 40 mg via SUBCUTANEOUS
  Filled 2017-10-25 (×11): qty 0.4

## 2017-10-25 MED ORDER — CLONAZEPAM 0.5 MG PO TABS
0.2500 mg | ORAL_TABLET | Freq: Three times a day (TID) | ORAL | Status: DC | PRN
  Administered 2017-10-26 – 2017-11-05 (×15): 0.25 mg via ORAL
  Filled 2017-10-25 (×30): qty 1

## 2017-10-25 MED ORDER — ALUM & MAG HYDROXIDE-SIMETH 200-200-20 MG/5ML PO SUSP
30.0000 mL | ORAL | Status: DC | PRN
  Administered 2017-10-29: 30 mL via ORAL
  Filled 2017-10-25: qty 30

## 2017-10-25 MED ORDER — BISACODYL 10 MG RE SUPP
10.0000 mg | Freq: Every day | RECTAL | Status: DC | PRN
  Administered 2017-10-26: 10 mg via RECTAL
  Filled 2017-10-25: qty 1

## 2017-10-25 MED ORDER — DIPHENHYDRAMINE HCL 12.5 MG/5ML PO ELIX: 12.5000 mg | ORAL_SOLUTION | Freq: Four times a day (QID) | ORAL | Status: DC | PRN

## 2017-10-25 MED ORDER — POLYETHYLENE GLYCOL 3350 17 G PO PACK
17.0000 g | PACK | Freq: Every day | ORAL | Status: DC | PRN
  Administered 2017-10-25 – 2017-10-26 (×2): 17 g via ORAL
  Filled 2017-10-25 (×2): qty 1

## 2017-10-25 MED ORDER — ACETAMINOPHEN 325 MG PO TABS
325.0000 mg | ORAL_TABLET | ORAL | Status: DC | PRN
  Administered 2017-10-26 – 2017-11-04 (×11): 650 mg via ORAL
  Filled 2017-10-25 (×11): qty 2

## 2017-10-25 MED ORDER — PROCHLORPERAZINE EDISYLATE 10 MG/2ML IJ SOLN: 5.0000 mg | Freq: Four times a day (QID) | INTRAMUSCULAR | Status: DC | PRN

## 2017-10-25 MED ORDER — TRAZODONE HCL 50 MG PO TABS
25.0000 mg | ORAL_TABLET | Freq: Every evening | ORAL | Status: DC | PRN
  Administered 2017-10-25 – 2017-11-04 (×9): 50 mg via ORAL
  Filled 2017-10-25 (×9): qty 1

## 2017-10-25 MED ORDER — PROCHLORPERAZINE 25 MG RE SUPP
12.5000 mg | Freq: Four times a day (QID) | RECTAL | Status: DC | PRN
Start: 2017-10-25 — End: 2017-11-05

## 2017-10-25 MED ORDER — DOCUSATE SODIUM 100 MG PO CAPS
100.0000 mg | ORAL_CAPSULE | Freq: Two times a day (BID) | ORAL | Status: DC
  Administered 2017-10-25 – 2017-11-05 (×22): 100 mg via ORAL
  Filled 2017-10-25 (×22): qty 1

## 2017-10-25 MED ORDER — PRO-STAT SUGAR FREE PO LIQD
30.0000 mL | Freq: Two times a day (BID) | ORAL | Status: DC
  Administered 2017-10-25 – 2017-11-05 (×22): 30 mL via ORAL
  Filled 2017-10-25 (×22): qty 30

## 2017-10-25 MED ORDER — PROCHLORPERAZINE MALEATE 5 MG PO TABS: 5.0000 mg | ORAL_TABLET | Freq: Four times a day (QID) | ORAL | Status: DC | PRN

## 2017-10-25 MED ORDER — GABAPENTIN 300 MG PO CAPS
300.0000 mg | ORAL_CAPSULE | Freq: Three times a day (TID) | ORAL | Status: DC
  Administered 2017-10-25 – 2017-10-28 (×11): 300 mg via ORAL
  Filled 2017-10-25 (×11): qty 1

## 2017-10-25 MED ORDER — METHOCARBAMOL 500 MG PO TABS
500.0000 mg | ORAL_TABLET | Freq: Four times a day (QID) | ORAL | Status: DC | PRN
  Administered 2017-10-26 – 2017-11-05 (×24): 500 mg via ORAL
  Filled 2017-10-25 (×26): qty 1

## 2017-10-25 MED ORDER — OXYCODONE HCL 5 MG PO TABS
10.0000 mg | ORAL_TABLET | ORAL | Status: DC | PRN
  Administered 2017-10-25 – 2017-10-26 (×3): 15 mg via ORAL
  Administered 2017-10-26: 10 mg via ORAL
  Administered 2017-10-26 – 2017-10-30 (×15): 15 mg via ORAL
  Filled 2017-10-25 (×16): qty 3
  Filled 2017-10-25: qty 2
  Filled 2017-10-25 (×2): qty 3

## 2017-10-25 MED ORDER — TRAMADOL HCL 50 MG PO TABS
50.0000 mg | ORAL_TABLET | Freq: Four times a day (QID) | ORAL | Status: DC | PRN
  Administered 2017-10-25 – 2017-11-03 (×16): 50 mg via ORAL
  Filled 2017-10-25 (×17): qty 1

## 2017-10-25 MED ORDER — GUAIFENESIN-DM 100-10 MG/5ML PO SYRP: 5.0000 mL | ORAL_SOLUTION | Freq: Four times a day (QID) | ORAL | Status: DC | PRN
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
## 2017-10-25 NOTE — Progress Notes (Signed)
Report called to at this time
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
## 2017-10-25 NOTE — Plan of Care (Signed)

## 2017-10-25 NOTE — Plan of Care (Signed)
  Problem: Consults Goal: RH GENERAL PATIENT EDUCATION Description See Patient Education module for education specifics. Outcome: Not Progressing Goal: Skin Care Protocol Initiated - if Braden Score 18 or less Description If consults are not indicated, leave blank or document N/A Outcome: Not Progressing   Problem: RH BOWEL ELIMINATION Goal: RH STG MANAGE BOWEL WITH ASSISTANCE Description STG Manage Bowel with min Assistance.  Outcome: Not Progressing Goal: RH STG MANAGE BOWEL W/MEDICATION W/ASSISTANCE Description STG Manage Bowel with Medication with min Assistance.  Outcome: Not Progressing   Problem: RH BLADDER ELIMINATION Goal: RH STG MANAGE BLADDER WITH ASSISTANCE Description STG Manage Bladder With min  Assistance  Outcome: Not Progressing Goal: RH STG MANAGE BLADDER WITH EQUIPMENT WITH ASSISTANCE Description STG Manage Bladder With Equipment With min Assistance  Outcome: Not Progressing   Problem: RH SKIN INTEGRITY Goal: RH STG SKIN FREE OF INFECTION/BREAKDOWN Outcome: Not Progressing Goal: RH STG MAINTAIN SKIN INTEGRITY WITH ASSISTANCE Description STG Maintain Skin Integrity With min-mod Assistance.  Outcome: Not Progressing Goal: RH STG ABLE TO PERFORM INCISION/WOUND CARE W/ASSISTANCE Description STG Able To Perform Incision/Wound Care With Assistance. Outcome: Not Progressing   Problem: RH SAFETY Goal: RH STG ADHERE TO SAFETY PRECAUTIONS W/ASSISTANCE/DEVICE Description STG Adhere to Safety Precautions With min Assistance/Device.  Outcome: Not Progressing Goal: RH STG DECREASED RISK OF FALL WITH ASSISTANCE Description STG Decreased Risk of Fall With min Assistance.  Outcome: Not Progressing   Problem: RH PAIN MANAGEMENT Goal: RH STG PAIN MANAGED AT OR BELOW PT'S PAIN GOAL Outcome: Not Progressing   Problem: RH KNOWLEDGE DEFICIT GENERAL Goal: RH STG INCREASE KNOWLEDGE OF SELF CARE AFTER HOSPITALIZATION Outcome: Not Progressing  New admit

## 2017-10-25 NOTE — Progress Notes (Signed)
Patient ID: [name], female   DOB: Aug 25, 1985, 32 y.o.   MRN: [id number] Pt arrived on the floor at 15:20 in the bed pushed by nurse. Pt c/o pain to the left foot and right shoulder. Pain medication was given by previous nurse before transfer. Pt is alert and oriented x 4. Able to answer all questions with assistance. Bedpan was used immediately upon coming to floor. Pt was able to turn left and right without assistance. Pt held left ankle in the air while turning. Vitals are stable.
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
## 2017-10-25 NOTE — Progress Notes (Addendum)
Patient ID: [name], female   DOB: 05-17-1985, 32 y.o.   MRN: [id number]     Subjective:  Patient reports pain as mild to moderate.  Pain improved no CP or SOB.  Continued right shoulder pain  Objective:   VITALS:   Vitals:   10/24/17 0600 10/24/17 1338 10/24/17 2016 10/25/17 0500  BP: 99/70 (!) 126/47 (!) 121/58 135/78  Pulse: 60 61 72 80  Resp: 14 18 17 17   Temp: 97.9 F (36.6 C) 98.2 F (36.8 C) 98.1 F (36.7 C) 97.7 F (36.5 C)  TempSrc: Oral Oral Oral Oral  SpO2: 100% 99% 99% 100%  Weight:      Height:        ABD soft Sensation intact distally Dorsiflexion/Plantar flexion intact Incision: dressing C/D/I and no drainage  Right shoulder pain and unable to support body weight with right upper ext   Lab Results  Component Value Date   WBC 16.9 (H) 10/22/2017   HGB 13.4 10/22/2017   HCT 39.9 10/22/2017   MCV 94.8 10/22/2017   PLT 298 10/22/2017   BMET    Component Value Date/Time   NA 137 10/21/2017 1705   NA 139 08/19/2012 1020   K 4.0 10/21/2017 1705   CL 103 10/21/2017 1705   CO2 19 (L) 10/21/2017 1647   GLUCOSE 120 (H) 10/21/2017 1705   BUN 7 10/21/2017 1705   BUN 11 08/19/2012 1020   CREATININE 0.73 10/22/2017 0103   CALCIUM 9.8 10/21/2017 1647   GFRNONAA >60 10/22/2017 0103   GFRAA >60 10/22/2017 0103     Assessment/Plan: 4 Days Post-Op   Active Problems:   Foot trauma, left, initial encounter   Advance diet Up with therapy Okay for CIR NWB left lower ext Dry dressing PRN Post op shoe when ambulating     DOUGLAS PARRY, BRANDON 10/25/2017, 1:21 PM  Discussed and agree with above.  CIR.  Teryl Lucy, MD Cell [phone]

## 2017-10-25 NOTE — Progress Notes (Signed)
Inpatient Rehabilitation Admissions Coordinator  I met with pt, her boyfriend and Dad at bedside to discuss her goals and expectations of an inpt rehab admit. They are in agreement to admit. I contacted Dr. Mardelle Matte, RN Cm and SW. I will make the arrangements to admit today.  Danne Baxter, RN, MSN Rehab Admissions Coordinator [phone] 10/25/2017 2:16 PM
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
## 2017-10-26 ENCOUNTER — Inpatient Hospital Stay (HOSPITAL_COMMUNITY): Payer: Self-pay | Admitting: Physical Therapy

## 2017-10-26 ENCOUNTER — Inpatient Hospital Stay (HOSPITAL_COMMUNITY): Payer: Self-pay

## 2017-10-26 DIAGNOSIS — S40011D Contusion of right shoulder, subsequent encounter: Secondary | ICD-10-CM

## 2017-10-26 LAB — CBC WITH DIFFERENTIAL/PLATELET
Abs Immature Granulocytes: 0.02 10*3/uL (ref 0.00–0.07)
Basophils Absolute: 0 10*3/uL (ref 0.0–0.1)
Basophils Relative: 0 %
EOS PCT: 2 %
Eosinophils Absolute: 0.1 10*3/uL (ref 0.0–0.5)
HCT: 36 % (ref 36.0–46.0)
HEMOGLOBIN: 11.8 g/dL — AB (ref 12.0–15.0)
Immature Granulocytes: 0 %
LYMPHS PCT: 38 %
Lymphs Abs: 2.6 10*3/uL (ref 0.7–4.0)
MCH: 31.9 pg (ref 26.0–34.0)
MCHC: 32.8 g/dL (ref 30.0–36.0)
MCV: 97.3 fL (ref 80.0–100.0)
MONO ABS: 0.5 10*3/uL (ref 0.1–1.0)
Monocytes Relative: 8 %
Neutro Abs: 3.5 10*3/uL (ref 1.7–7.7)
Neutrophils Relative %: 52 %
Platelets: 273 10*3/uL (ref 150–400)
RBC: 3.7 MIL/uL — AB (ref 3.87–5.11)
RDW: 12.3 % (ref 11.5–15.5)
WBC: 6.8 10*3/uL (ref 4.0–10.5)
nRBC: 0 % (ref 0.0–0.2)

## 2017-10-26 LAB — COMPREHENSIVE METABOLIC PANEL
ALK PHOS: 68 U/L (ref 38–126)
ALT: 129 U/L — ABNORMAL HIGH (ref 0–44)
AST: 101 U/L — ABNORMAL HIGH (ref 15–41)
Albumin: 3.3 g/dL — ABNORMAL LOW (ref 3.5–5.0)
Anion gap: 9 (ref 5–15)
BILIRUBIN TOTAL: 0.6 mg/dL (ref 0.3–1.2)
BUN: 8 mg/dL (ref 6–20)
CALCIUM: 9 mg/dL (ref 8.9–10.3)
CHLORIDE: 101 mmol/L (ref 98–111)
CO2: 28 mmol/L (ref 22–32)
CREATININE: 0.59 mg/dL (ref 0.44–1.00)
Glucose, Bld: 107 mg/dL — ABNORMAL HIGH (ref 70–99)
Potassium: 3.9 mmol/L (ref 3.5–5.1)
SODIUM: 138 mmol/L (ref 135–145)
TOTAL PROTEIN: 6.4 g/dL — AB (ref 6.5–8.1)
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
## 2017-10-26 NOTE — Progress Notes (Signed)
Subjective: Patient complains of left foot pain and left shoulder pain.  These are gradually improving.  No other complaints. Objective:BP (!) 116/50 (BP Location: Left Arm)   Pulse 63   Temp 98.5 F (36.9 C) (Oral)   Resp 18   Wt 122.4 kg   SpO2 98%   BMI 42.26 kg/m   Overweight female in no acute distress.  HEENT exam atraumatic, normocephalic, neck is supple Chest is clear to auscultation Cardiac exam S1 and S2 are regular Abdominal exam overweight, active bowel sounds, soft Extremities left foot is wrapped.  No significant edema on the right lower extremity left shoulder is tender to palpation no edema of the upper extremities Neurologic exam she is alert  Assessment and plan #1 functional deficits secondary to left foot multitrauma.  She is been admitted to inpatient rehab. 2.  Pain management, will continue current medications and therapies.  She understands that she will likely be uncomfortable for quite some time. 3. Leukocytosis.  No signs or symptoms of infection.  Leukocytosis has resolved. Lab Results  Component Value Date   WBC 6.8 10/26/2017   #4 acute blood loss anemia.  No concern. Lab Results  Component Value Date   HCT 36.0 10/26/2017  #5 obesity will encourage improved diet.

## 2017-10-26 NOTE — Progress Notes (Signed)
Physical Therapy Session Note  Patient Details  Name: [name] MRN: [id number] Date of Birth: 05-01-1985  Today's Date: 10/26/2017 PT Individual Time: 1425-1540 PT Individual Time Calculation (min): 75 min   Short Term Goals: Week 1:  PT Short Term Goal 1 (Week 1): Pt will transfer bed<>chair w/ supervision PT Short Term Goal 2 (Week 1): Pt will tolerate 30 sec of keeping LLE in dependent position w/o increase in pain PT Short Term Goal 3 (Week 1): Pain will perform w/c parts management w/ supervision PT Short Term Goal 4 (Week 1): Pt will initiate gait and stair training  Skilled Therapeutic Interventions/Progress Updates: Pt presented in w/c agreeable to therapy. Pt indicated 9/10 pain in L ankle/foot however recently received pain meds. PTA asssited pt's w/c out of room and pt was able to propel using BUE approx 26f then c/o increased pain in R shoulder. Pt transported remaining distance to ortho gym. Pt participated in car transfer via stand pivot CGA. Pt required extended rest break due to increased pain in L foot when in dependent position. Pt then indicating 10/10 pain. Pt was able to return to w/c after approx 5 min rest break with pt indicating pain decreased. Pt then transported to rehab gym and performed STS with RW. Pt unable to place RUE on RW due to increased pain in shoulder. Pt was able to flex L knee and place on high/low mat (to simulate knee scooter). Pt was able to tolerate position for approx 1 min with no increase in pain. Pt also able to maintain standing without AD during this time with no LOB. Pt noted to have increase in pain when changing position (performing knee extension) when returning to w/c. Pt required increased time for recovery after returning to w/c due to pain. Participated in AAROM shoulder flexion/extension WLP 2x10. Pt was able to increase flexion with increased repetition without significant increase in pain. Pt transported back to room and pt  performed stand pivot with RW to bed with CGA. Pt was able to return to supine with increased time and CGA with bed flat. Pt repositioned to comfort and PTA placed ice pack on R shoulder (nsg advised). Also discussed with nsg difficulty in tolerating session due to significant pain. Pt left with call bell within reach, family present, and current needs met.      Therapy Documentation Precautions:  Precautions Precautions: Fall Required Braces or Orthoses: Other Brace/Splint Other Brace/Splint: Ace wrap around L foot, not to get wet Restrictions Weight Bearing Restrictions: Yes RUE Weight Bearing: Weight bearing as tolerated LLE Weight Bearing: Non weight bearing General:   Vital Signs: Therapy Vitals Temp: 97.8 F (36.6 C) Temp Source: Oral Pulse Rate: 70 Resp: 18 BP: (!) 113/31 Patient Position (if appropriate): Lying Oxygen Therapy SpO2: 95 % O2 Device: Room Air Other Treatments:      Therapy/Group: Individual Therapy  [name]  [name], PTA  10/26/2017, 4:08 PM
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
## 2017-10-26 NOTE — Progress Notes (Signed)
Nutrition Consult/Brief Note  RD consulted for nutrition education regarding weight loss. Pt currently in therapy session. RD left "Weight Loss Tips" handout from the [employer] on tray table.  Body mass index is 42.26 kg/m. Pt meets criteria for Obesity Class III based on current BMI.  Current diet order is Heart Healthy, patient is consuming approximately 100% of meals at this time. Labs and medications reviewed.   RD available for follow-up should pt have questions and/or concerns. No further nutrition interventions warranted at this time.  Maureen Chatters, RD, LDN Pager #: [phone] After-Hours Pager #: [phone]
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
## 2017-10-27 ENCOUNTER — Inpatient Hospital Stay (HOSPITAL_COMMUNITY): Payer: Self-pay | Admitting: Physical Therapy

## 2017-10-27 NOTE — Progress Notes (Signed)
Physical Therapy Session Note  Patient Details  Name: [name] MRN: [id number] Date of Birth: [birth year]  Today's Date: 10/27/2017 PT Individual Time: 4098-1191 PT Individual Time Calculation (min): 56 min   Short Term Goals: Week 1:  PT Short Term Goal 1 (Week 1): Pt will transfer bed<>chair w/ supervision PT Short Term Goal 2 (Week 1): Pt will tolerate 30 sec of keeping LLE in dependent position w/o increase in pain PT Short Term Goal 3 (Week 1): Pain will perform w/c parts management w/ supervision PT Short Term Goal 4 (Week 1): Pt will initiate gait and stair training  Skilled Therapeutic Interventions/Progress Updates:    pt in bed and agreeable to therapy. Pt states she would like to try to put on leggings that friend brought her.  Pt unable to tolerate tight leggings over Lt foot.  PT encouraged pt to have friends bring shorts or loose fitting pants, pt agreeable.  Pt performs bed mobility with assistance for LT LE only.  Squat pivot transfers throughout session with min A.  Sit <> stand training with RW with pt unable to grasp RW with Rt UE due to pain, able to stand with LT UE support with supervision, min A for sit <> stand. Pt's standing tolerance limited by pain in Lt foot when in dependent position.  AAROM for Lt LE with hip abd/add, heel slides, LAQ with improvement in activity tolerance noted.  AAROM to Rt elbow and wrist with pt's tolerance improving throughout session.  W/c mobility 100', 150' with supervision and increased time due to fatigue. Pt left in w/c with family present, needs at hand.  Therapy Documentation Precautions:  Precautions Precautions: Fall Required Braces or Orthoses: Other Brace/Splint Other Brace/Splint: Ace wrap around L foot, not to get wet Restrictions Weight Bearing Restrictions: Yes RUE Weight Bearing: Weight bearing as tolerated LLE Weight Bearing: Non weight bearing Pain: Pt c/o pain in Lt foot in dependent position, rest and  repositioned as needed   Therapy/Group: Individual Therapy  Cardin Nitschke 10/27/2017, 1:55 PM
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
## 2017-10-28 ENCOUNTER — Inpatient Hospital Stay (HOSPITAL_COMMUNITY): Payer: Self-pay | Admitting: Occupational Therapy

## 2017-10-28 ENCOUNTER — Inpatient Hospital Stay (HOSPITAL_COMMUNITY): Payer: Self-pay

## 2017-10-28 DIAGNOSIS — T1490XA Injury, unspecified, initial encounter: Secondary | ICD-10-CM

## 2017-10-28 DIAGNOSIS — D62 Acute posthemorrhagic anemia: Secondary | ICD-10-CM

## 2017-10-28 DIAGNOSIS — R740 Nonspecific elevation of levels of transaminase and lactic acid dehydrogenase [LDH]: Secondary | ICD-10-CM

## 2017-10-28 DIAGNOSIS — E8809 Other disorders of plasma-protein metabolism, not elsewhere classified: Secondary | ICD-10-CM

## 2017-10-28 DIAGNOSIS — R7401 Elevation of levels of liver transaminase levels: Secondary | ICD-10-CM

## 2017-10-28 DIAGNOSIS — E46 Unspecified protein-calorie malnutrition: Secondary | ICD-10-CM

## 2017-10-28 MED ORDER — HYDROGEN PEROXIDE 3 % EX SOLN: Freq: Every day | CUTANEOUS | Status: DC

## 2017-10-28 MED ORDER — GABAPENTIN 400 MG PO CAPS
400.0000 mg | ORAL_CAPSULE | Freq: Three times a day (TID) | ORAL | Status: DC
  Administered 2017-10-28 – 2017-11-05 (×33): 400 mg via ORAL
  Filled 2017-10-28 (×32): qty 1
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
## 2017-10-28 NOTE — Progress Notes (Signed)
Occupational Therapy Session Note  Patient Details  Name: [name] MRN: [id number] Date of Birth: Nov 15, 1985  Today's Date: 10/28/2017 OT Individual Time: 0830-0900 OT Individual Time Calculation (min): 30 min    Short Term Goals: Week 1:  OT Short Term Goal 1 (Week 1): Pt will don pants with CGA OT Short Term Goal 2 (Week 1): Pt will transfer to toilet with CGA OT Short Term Goal 3 (Week 1): Pt will report pain no greater than 4/10 in LLE during ADL transfers OT Short Term Goal 4 (Week 1): Pt will use R UE to feed self 100% of meal with set up only  Skilled Therapeutic Interventions/Progress Updates:    Pt received in bed and agreeable to ADL training.  Pt sat to EOB with S and L foot propped up on trashcan bottom to keep it elevated. Due to short appt time scheduled, pt completed a light washup at EOB and dressed.  She was able to bathe with min A to fully lift R arm. Verbal cue for donning shirt with set up and min A to start shorts over R foot and then pt was able to complete pulling on. Her PT arrived for her next session.    Therapy Documentation Precautions:  Precautions Precautions: Fall Required Braces or Orthoses: Other Brace/Splint Other Brace/Splint: Ace wrap around L foot, not to get wet Restrictions Weight Bearing Restrictions: Yes RUE Weight Bearing: Weight bearing as tolerated LLE Weight Bearing: Non weight bearing    Vital Signs: Therapy Vitals Temp: 97.7 F (36.5 C) Temp Source: Oral Pulse Rate: 63 Resp: 18 BP: (!) 119/48 Patient Position (if appropriate): Lying Oxygen Therapy SpO2: 95 % O2 Device: Room Air Pain: Pain Assessment Pain Scale: 0-10 Pain Score: 7  Pain Type: Acute pain;Surgical pain;Neuropathic pain Pain Location: Foot Pain Orientation: Left Pain Descriptors / Indicators: Burning;Aching;Constant;Shooting;Sore;Throbbing;Tingling Pain Frequency: Constant Pain Onset: On-going Pain Intervention(s): Medication (See  eMAR) Multiple Pain Sites: No ADL: Refer to [facility]     Therapy/Group: Individual Therapy  Markesia Crilly 10/28/2017, 8:45 AM

## 2017-10-28 NOTE — Progress Notes (Signed)
Occupational Therapy Session Note  Patient Details  Name: [name] MRN: [id number] Date of Birth: Jan 17, 1985  Today's Date: 10/28/2017 OT Individual Time: 4098-1191 OT Individual Time Calculation (min): 39 min    Short Term Goals: Week 1:  OT Short Term Goal 1 (Week 1): Pt will don pants with CGA OT Short Term Goal 2 (Week 1): Pt will transfer to toilet with CGA OT Short Term Goal 3 (Week 1): Pt will report pain no greater than 4/10 in LLE during ADL transfers OT Short Term Goal 4 (Week 1): Pt will use R UE to feed self 100% of meal with set up only  Skilled Therapeutic Interventions/Progress Updates:    Pt sitting in w/c upon entry with reports of 7/10 pain in L foot with reports of receiving pain meds ~30 mins prior. Pt propelled self in w/c to therapy gym with focus on w/c mobility and BUE strength. Session completed with focus on increasing tolerance to LLE being in dependent position- while seated in w/c elevated leg rests lowered to ~45 degree position with pt able to tolerate ~20 mins. During this time pt participated in RUE ROM tasks with focus on increasing tolerance to AROM for shoulder flexion <> shoulder extension with pt able to tolerate sliding game pieces on table top at lowest positioning however increased pain present when pt reached maximal tolerable position ~60 degrees of shoulder flexion and then initiating bringing UE back to midline.   Pt transferred back to room in w/c total A for time mgmt. Education provided regarding Kineseo tape purpose- power strip tape applied at shoulder for pain mgmt. Pt left seated in w/c with all needs in reach and chair alarm set.      Therapy Documentation Precautions:  Precautions Precautions: Fall Required Braces or Orthoses: Other Brace/Splint Other Brace/Splint: Ace wrap around L foot, not to get wet Restrictions Weight Bearing Restrictions: Yes RUE Weight Bearing: Weight bearing as tolerated LLE Weight Bearing: Non  weight bearing   Therapy/Group: Individual Therapy  Kristel Durkee 10/28/2017, 11:46 AM

## 2017-10-28 NOTE — Care Management Note (Signed)
[facility] Individual Statement of Services  Patient Name:  Jeanise Durfey  Date:  10/28/2017  Welcome to the [facility].  Our goal is to provide you with an individualized program based on your diagnosis and situation, designed to meet your specific needs.  With this comprehensive rehabilitation program, you will be expected to participate in at least 3 hours of rehabilitation therapies Monday-Friday, with modified therapy programming on the weekends.  Your rehabilitation program will include the following services:  Physical Therapy (PT), Occupational Therapy (OT), 24 hour per day rehabilitation nursing, Therapeutic Recreaction (TR), Neuropsychology, Case Management (Social Worker), Rehabilitation Medicine, Nutrition Services and Pharmacy Services  Weekly team conferences will be held on Wednesday to discuss your progress.  Your Social Worker will talk with you frequently to get your input and to update you on team discussions.  Team conferences with you and your family in attendance may also be held.  Expected length of stay: 10-14 days  Overall anticipated outcome: independent with device  Depending on your progress and recovery, your program may change. Your Social Worker will coordinate services and will keep you informed of any changes. Your Social Worker's name and contact numbers are listed  below.  The following services may also be recommended but are not provided by the [facility]:   Driving Evaluations  Home Health Rehabiltiation Services  Outpatient Rehabilitation Services    Arrangements will be made to provide these services after discharge if needed.  Arrangements include referral to agencies that provide these services.  Your insurance has been verified to be:  Uninsured-applying for [facility] Your primary doctor is:  Bennie Pierini  Pertinent information will be shared with your doctor and  your insurance company.  Social Worker:  Dossie Der, SW [phone] or [phone]  Information discussed with and copy given to patient by: Lucy Chris, 10/28/2017, 9:51 AM

## 2017-10-28 NOTE — Consult Note (Signed)
WOC Nurse wound consult note Reason for Consult: Surgical site: plantar aspect of left foot/heel/toes. Dr. Dion Saucier performed operative procedure on 10/22/17. Dressing changed 10/17.  Current dressing adhering to wound.  PA and nursing staff removed adhering dressing using H2O2 with atraumatic removal. Wound bed clean, pink and healing. Wound type:surgical Pressure Injury POA: NA Measurement: (Including heel)   18cm x 5cm x 0.1cm  Sutures intact at toes and digit that has been partially removed. Wound ZOX:[id number], red, moist Drainage (amount, consistency, odor) No pooling exudate. Serous, no odor Periwound: intact, some dried blood Dressing procedure/placement/frequency: Recommend a nonadherent dressing to wound bed (Mepitel silicone wound contact layer) and an ABD pad to provide comfort in ambulation. Secure with Kerlix roll gauze/paper tape. Dressing to be changed daily at this time; may be decreased to every other day when reepithelialization occurs and patient comfort level improves.   Will need guidance from Dr. Dion Saucier for suture removal date and when to follow up in office post discharge.  WOC nursing team will not follow, but will remain available to this patient, the nursing and medical teams.  Please re-consult if needed. Thanks, Ladona Mow, MSN, RN, GNP, [facility]  Pager# [phone]
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
## 2017-10-28 NOTE — Progress Notes (Signed)
At 2030, PRN oxy IR 15mg  given for C/O left foot pain, described as shooting, tingling, throbbing and sore. At 2126, PRN trazodone 50mg  given, not effective, robaxin given at that time, as well. After getting up to [facility], patient crying, unable to settle down, PRN klonipin given at 0003. Patient slept from 0030-0610. Alfredo Martinez A

## 2017-10-28 NOTE — Progress Notes (Addendum)
Physical Therapy Session Note  Patient Details  Name: [name] MRN: [id number] Date of Birth: 04/28/1985  Today's Date: 10/28/2017 PT Individual Time: 0900-1000 PT Individual Time Calculation (min): 60 min   Short Term Goals: Week 1:  PT Short Term Goal 1 (Week 1): Pt will transfer bed<>chair w/ supervision PT Short Term Goal 2 (Week 1): Pt will tolerate 30 sec of keeping LLE in dependent position w/o increase in pain PT Short Term Goal 3 (Week 1): Pain will perform w/c parts management w/ supervision PT Short Term Goal 4 (Week 1): Pt will initiate gait and stair training      Skilled Therapeutic Interventions/Progress Updates:  Pt sitting EOB finishing bath with Amil Amen, OT.  Squat pivot bed> w/c to L with min guard assist, NWBing LLE without cues.  W/c propulsion on level tile with supervision, cues for efficiency and turns. Pt moved bil leg rests out to side with min cues.   Seated Therapeutic exercises performed with LEs to increase strength for functional mobility : 10 x 1 L ankle PF/DF to tolerance, bil hip internal rotation with adductor squeeze at end range, 15 x 1 each R hip flexion with 4# wt, and R long arc quad knee extension with 4 # wt, with ankle pumps at end range, bil glut sets .  Pt reported that her mother's house has 5 STE in front, and she thinks also 5 STE at back, both with rails, but she will confirm with family.  She will call a local church to inquire about a ramp.  "Basically, I think I'll just have to hop up 5 steps to enter".   Pt attempted standing with bil UE support on railings of steps.  She tolerated less than 30 seconds but had to sit due to increasing L foot pain. Tearful due to pain. PT offered emotional support and encouraged her to check with the church about a ramp.  W/c propulsion through obstacle course with 1 cue for attention to LLE as well as position of wheels, without bumping 6 cones.  Pt left resting in w/c with needs at hand,  and LLE elevated fully on ELR.     Therapy Documentation Precautions:  Precautions Precautions: Fall Required Braces or Orthoses: Other Brace/Splint Other Brace/Splint: Ace wrap around L foot, not to get wet Restrictions Weight Bearing Restrictions: Yes RUE Weight Bearing: Weight bearing as tolerated LLE Weight Bearing: Non weight bearing   Pain: Pain Assessment Pain Scale: 0-10 Pain Score: 7 Pain Type: Acute pain;Surgical pain;Neuropathic pain Pain Location: Foot Pain Orientation: Left Pain Descriptors / Indicators: Burning;Aching;Constant;Shooting;Sore;Throbbing;Tingling Pain Frequency: Constant Pain Onset: On-going Pain Intervention(s): Medication (See eMAR) Multiple Pain Sites: No     Therapy/Group: Individual Therapy  Aniyla Harling 10/28/2017, 10:11 AM

## 2017-10-29 ENCOUNTER — Inpatient Hospital Stay (HOSPITAL_COMMUNITY): Payer: Self-pay | Admitting: Physical Therapy

## 2017-10-29 ENCOUNTER — Encounter (HOSPITAL_COMMUNITY): Payer: Self-pay | Admitting: Psychology

## 2017-10-29 ENCOUNTER — Inpatient Hospital Stay (HOSPITAL_COMMUNITY): Payer: Self-pay | Admitting: Occupational Therapy

## 2017-10-29 DIAGNOSIS — G8918 Other acute postprocedural pain: Secondary | ICD-10-CM

## 2017-10-29 DIAGNOSIS — S40011S Contusion of right shoulder, sequela: Secondary | ICD-10-CM

## 2017-10-29 LAB — BASIC METABOLIC PANEL
ANION GAP: 8 (ref 5–15)
BUN: 10 mg/dL (ref 6–20)
CALCIUM: 9.4 mg/dL (ref 8.9–10.3)
CO2: 25 mmol/L (ref 22–32)
Chloride: 104 mmol/L (ref 98–111)
Creatinine, Ser: 0.55 mg/dL (ref 0.44–1.00)
Glucose, Bld: 119 mg/dL — ABNORMAL HIGH (ref 70–99)
Potassium: 4 mmol/L (ref 3.5–5.1)
Sodium: 137 mmol/L (ref 135–145)

## 2017-10-29 LAB — CBC
HCT: 40.1 % (ref 36.0–46.0)
Hemoglobin: 12.8 g/dL (ref 12.0–15.0)
MCH: 31.1 pg (ref 26.0–34.0)
MCHC: 31.9 g/dL (ref 30.0–36.0)
MCV: 97.3 fL (ref 80.0–100.0)
NRBC: 0 % (ref 0.0–0.2)
PLATELETS: 331 10*3/uL (ref 150–400)
RBC: 4.12 MIL/uL (ref 3.87–5.11)
RDW: 12.1 % (ref 11.5–15.5)
WBC: 8.8 10*3/uL (ref 4.0–10.5)

## 2017-10-29 MED ORDER — NICOTINE 7 MG/24HR TD PT24
7.0000 mg | MEDICATED_PATCH | Freq: Every day | TRANSDERMAL | Status: DC
  Administered 2017-10-29 – 2017-11-05 (×8): 7 mg via TRANSDERMAL
  Filled 2017-10-29 (×9): qty 1
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
## 2017-10-29 NOTE — Progress Notes (Signed)
Physical Therapy Session Note  Patient Details  Name: [name] MRN: [id number] Date of Birth: 1985-02-23  Today's Date: 10/29/2017 PT Individual Time: 0800-0915 PT Individual Time Calculation (min): 75 min   Short Term Goals: Week 1:  PT Short Term Goal 1 (Week 1): Pt will transfer bed<>chair w/ supervision PT Short Term Goal 2 (Week 1): Pt will tolerate 30 sec of keeping LLE in dependent position w/o increase in pain PT Short Term Goal 3 (Week 1): Pain will perform w/c parts management w/ supervision PT Short Term Goal 4 (Week 1): Pt will initiate gait and stair training  Skilled Therapeutic Interventions/Progress Updates:    Pt received sitting in bed with HOB elevated and LLE elevated with boyfriend present; 7/10  throbbing pain in L foot. Pt dressed self and brushed teeth in sitting position with set up. Supervision squat pivot transfers throughout session without RW (bed>w/c, w/c><mat table). Needs assistance with w/c parts management. ModI w/c propulsion x 150' to/from rehab gym. Seated with LLE in a more dependent position: ankle pumps, ankle rolls (clockwise & counterclockwise), ankle inversion/eversion, and LAQ with touching assist to reduce anxiety with movement; only able to tolerate ~30 degrees knee flexion. Supine heels slides with towel. Sidelying hip abduction. Sitting on mat propped on B hands for weightbearing as tolerated through RUE. Progressively slide to edge of mat (without UE support) for gradual lowering of LLE as tolerated with shoulder exercises to advert attention away from L ankle/foot pain: shoulder rolls, B shoulder abduction, and scapular retractions for shoulder ROM/strength. Pt remained sitting in w/c at the end of the session with boyfriend present and all needs in reach. RN alerted of pt request for pain meds.  Therapy Documentation Precautions:  Precautions Precautions: Fall Required Braces or Orthoses: Other Brace/Splint Other Brace/Splint: Ace  wrap around L foot, not to get wet Restrictions Weight Bearing Restrictions: Yes RUE Weight Bearing: Weight bearing as tolerated LLE Weight Bearing: Non weight bearing Pain: Pain Assessment Pain Scale: 0-10 Pain Score: 7  Pain Type: Surgical pain;Acute pain Pain Location: Foot Pain Orientation: Left Pain Descriptors / Indicators: Throbbing Pain Frequency: Constant Pain Onset: On-going(worse in dependent position) Pain Intervention(s): RN made aware;Elevated extremity;Rest Multiple Pain Sites: No   Therapy/Group: Individual Therapy  [place] Daryl Quiros 10/29/2017, 9:42 AM

## 2017-10-29 NOTE — Progress Notes (Signed)
Occupational Therapy Session Note  Patient Details  Name: [name] MRN: [id number] Date of Birth: 1985/02/26  Today's Date: 10/29/2017 OT Individual Time:  1000-1100       Short Term Goals: Week 1:  OT Short Term Goal 1 (Week 1): Pt will don pants with CGA OT Short Term Goal 2 (Week 1): Pt will transfer to toilet with CGA OT Short Term Goal 3 (Week 1): Pt will report pain no greater than 4/10 in LLE during ADL transfers OT Short Term Goal 4 (Week 1): Pt will use R UE to feed self 100% of meal with set up only  Skilled Therapeutic Interventions/Progress Updates:    Pt sitting in w/c upon entry with reports of pain in LLE and R shoulder- RN notified and distributed meds during therapy session. Pt performed functional transfers this session with CGA for initiation of standing with pt pulling up on grab bar and min A times for controlled decent into w/c. Pt transferred onto [facility] in shower and performed all bathing tasks while seated using cutout for bathing of buttocks. Bathing session completed with LLE positioned on trash can due to pt unable to tolerate LLE being in dependent position. Pt transferred back to w/c and performed dressing tasks while seated with pt performing figure 4 in order to thread LLE into shorts. CGA required in standing at sink for safety with pt able to pull pants up with unilateral UE support on sink at all times for balance. Pt with good carryover of threading LLE into pants and RUE into shirt first and able to complete. Discussion and education provided regarding use of drop arm BSC at home following d/c in order to safely perform squat pivot transfers for toileting with verbal comprehension from pt.   Power strip kinesio tape applied at shoulder for pain mgmt with reports from pt of alleviated pain from previous session after tape was applied. Ice pack applied to bottom of foot with LLE elevated on foot rest for pain mgmt. RN notified and made aware to monitor  ice pack for condensation.    Therapy Documentation Precautions:  Precautions Precautions: Fall Required Braces or Orthoses: Other Brace/Splint Other Brace/Splint: Ace wrap around L foot, not to get wet Restrictions Weight Bearing Restrictions: Yes RUE Weight Bearing: Weight bearing as tolerated LLE Weight Bearing: Non weight bearing ADL: ADL Upper Body Bathing: Supervision/safety Where Assessed-Upper Body Bathing: Shower Lower Body Bathing: Supervision/safety Where Assessed-Lower Body Bathing: Shower Upper Body Dressing: Setup Where Assessed-Upper Body Dressing: Sitting at sink, Wheelchair Lower Body Dressing: Contact guard Where Assessed-Lower Body Dressing: Secondary school teacher: Minimal assistance Film/video editor Method: Warden/ranger: Other (comment)(BSC in shower)   Therapy/Group: Individual Therapy  [name] 10/29/2017, 11:48 AM

## 2017-10-29 NOTE — Progress Notes (Signed)
Physical Therapy Session Note  Patient Details  Name: [name] MRN: [id number] Date of Birth: November 07, 1985  Today's Date: 10/29/2017 PT Individual Time: 0930-1000 PT Individual Time Calculation (min): 30 min   Short Term Goals: Week 1:  PT Short Term Goal 1 (Week 1): Pt will transfer bed<>chair w/ supervision PT Short Term Goal 2 (Week 1): Pt will tolerate 30 sec of keeping LLE in dependent position w/o increase in pain PT Short Term Goal 3 (Week 1): Pain will perform w/c parts management w/ supervision PT Short Term Goal 4 (Week 1): Pt will initiate gait and stair training  Skilled Therapeutic Interventions/Progress Updates:   Pt in w/c and agreeable to therapy, pain as detailed below. Pt self-propelled w/c to/from therapy gym w/ BUEs. Worked on tolerance to LLE in dependent position both in seated and in standing. Performed LAQs in seated 3 reps x4 bouts. Performed standing in parallel bars x5 reps w/ supervision-CGA. Able to tolerate standing for 2-3 sec at a time 2/2 pain. Pt required LLE propped in between LAQ sets and standing for pain relief. Pain improved in dependent positioning since last session with this therapist, however continues to greatly limit standing tolerance. Returned to room and ended session in w/c, all needs in reach.  Therapy Documentation Precautions:  Precautions Precautions: Fall Required Braces or Orthoses: Other Brace/Splint Other Brace/Splint: Ace wrap around L foot, not to get wet Restrictions Weight Bearing Restrictions: Yes RUE Weight Bearing: Weight bearing as tolerated LLE Weight Bearing: Non weight bearing Pain: Pain Assessment Pain Scale: 0-10 Pain Score: 7  Pain Type: Surgical pain;Acute pain Pain Location: Foot Pain Orientation: Left Pain Descriptors / Indicators: Throbbing Pain Frequency: Constant Pain Onset: On-going(worse in dependent position) Pain Intervention(s): RN made aware;Elevated extremity;Rest Multiple Pain  Sites: No  Therapy/Group: Individual Therapy  Evangelos Paulino K Jermon Chalfant 10/29/2017, 10:06 AM

## 2017-10-29 NOTE — Progress Notes (Signed)
[name] PHYSICAL MEDICINE & REHABILITATION PROGRESS NOTE  Subjective/Complaints: Patient seen laying in bed this morning.  She states she slept well overnight.  She states she had a good first day of therapies yesterday.  ROS: Denies CP, SOB, N/V/D.  Objective: Vital Signs: Blood pressure (!) 125/53, pulse 75, temperature 97.8 F (36.6 C), temperature source Oral, resp. rate 20, weight 122.4 kg, SpO2 97 %. No results found. Recent Labs    10/29/17 0507  WBC 8.8  HGB 12.8  HCT 40.1  PLT 331   Recent Labs    10/29/17 0507  NA 137  K 4.0  CL 104  CO2 25  GLUCOSE 119*  BUN 10  CREATININE 0.55  CALCIUM 9.4    Physical Exam: BP (!) 125/53 (BP Location: Right Arm)   Pulse 75   Temp 97.8 F (36.6 C) (Oral)   Resp 20   Wt 122.4 kg   SpO2 97%   BMI 42.26 kg/m  Constitutional: She appearswell-developedand well-nourished.Morbidly obese female. NAD. HENT: Normocephalic.  Atraumatic. Eyes:EOMI. No discharge.  Cardiovascular:RRR.  No JVD. Respiratory:Effort normal.  Clear. VH:[id number]. Bowel sounds normal.  Musculoskeletal: LLE tenderness Right shoulder tenderness Neurological:  Alert and oriented Motor: LUE/RLE 4+/5.  RUE limited by pain proximally, 4/5 distally Left lower extremity: Hip flexion, knee extension 4/5, ankle dorsiflexion 2+/5 (pain inhibition) Skin:Left foot with the dressing C/D/I. Proximal right upper extremity with hematoma Psychiatric: She has anormal mood and affect. Herbehavior is normal.Judgmentand thought contentnormal.   Assessment/Plan: 1. Functional deficits secondary to multi-Ortho which require 3+ hours per day of interdisciplinary therapy in a comprehensive inpatient rehab setting.  Physiatrist is providing close team supervision and 24 hour management of active medical problems listed below.  Physiatrist and rehab team continue to assess barriers to discharge/monitor patient progress toward functional and medical  goals  Care Tool:  Bathing    Body parts bathed by patient: Right arm, Left arm, Chest, Front perineal area, Abdomen, Face, Left lower leg, Right lower leg, Left upper leg, Buttocks, Right upper leg         Bathing assist Assist Level: Minimal Assistance - Patient > 75%     Upper Body Dressing/Undressing Upper body dressing   What is the patient wearing?: Pull over shirt    Upper body assist Assist Level: Supervision/Verbal cueing    Lower Body Dressing/Undressing Lower body dressing      What is the patient wearing?: Pants     Lower body assist Assist for lower body dressing: Minimal Assistance - Patient > 75%     Toileting Toileting    Toileting assist Assist for toileting: Minimal Assistance - Patient > 75%     Transfers Chair/bed transfer  Transfers assist     Chair/bed transfer assist level: Minimal Assistance - Patient > 75%     Locomotion Ambulation   Ambulation assist   Ambulation activity did not occur: Safety/medical concerns(pain)          Walk 10 feet activity   Assist  Walk 10 feet activity did not occur: Safety/medical concerns        Walk 50 feet activity   Assist Walk 50 feet with 2 turns activity did not occur: Safety/medical concerns         Walk 150 feet activity   Assist Walk 150 feet activity did not occur: Safety/medical concerns         Walk 10 feet on uneven surface  activity   Assist Walk 10 feet on uneven surfaces  activity did not occur: Safety/medical concerns         Wheelchair     Assist Will patient use wheelchair at discharge?: Yes Type of Wheelchair: Manual    Wheelchair assist level: Supervision/Verbal cueing Max wheelchair distance: 100    Wheelchair 50 feet with 2 turns activity    Assist        Assist Level: Supervision/Verbal cueing   Wheelchair 150 feet activity     Assist Wheelchair 150 feet activity did not occur: Safety/medical concerns           Medical Problem List and Plan: 1.Functional deficitssecondary to left foot multi-Ortho, right shoulder contusion  Continue CIR 2. DVT Prophylaxis/Anticoagulation: Pharmaceutical:Lovenox 3. Pain Management: Increased Neurontin to qid as neuropathy   Relatively controlled on 10/22 4. Mood:LCSW to follow for evaluation and support. Mood appears stable. D/ced valium and added klonopin prn for anxiety. 5. Neuropsych: This patientiscapable of making decisions onherown behalf. 6. Skin/Wound Care:Monitor wound for healing. Maintain adequate nutritional and hydration status. 7. Fluids/Electrolytes/Nutrition:Monitor I/O.   BMP within acceptable range on 10/22 8. Right shoulder contusion: Local measures with ice and Voltaren gel qid.  9. Leucocytosis: Resolved 10. ABLA: Resolved  Hemoglobin 12.8 on 10/22  Continue to monitor 11. Morbid obesity: Educate on importance of weight loss to help promote mobility and overall health. Consulted dietician for education. 12. Transaminitis  LFTs elevated on 10/19  Will order labs at the end of the week  Continue to monitor 13.  Hypoalbuminemia  Supplement initiated on 10/19  LOS: 4 days A FACE TO FACE EVALUATION WAS PERFORMED  [name] Karis Juba 10/29/2017, 8:53 AM

## 2017-10-29 NOTE — Plan of Care (Signed)
  Problem: RH SAFETY Goal: RH STG ADHERE TO SAFETY PRECAUTIONS W/ASSISTANCE/DEVICE Description STG Adhere to Safety Precautions With min Assistance/Device.  Outcome: Progressing Goal: RH STG DECREASED RISK OF FALL WITH ASSISTANCE Description STG Decreased Risk of Fall With min Assistance.  Outcome: Progressing  Call light within reach, bed/chair alarm, proper footwear.  

## 2017-10-29 NOTE — Progress Notes (Signed)
Physical Therapy Session Note  Patient Details  Name: [name] MRN: [id number] Date of Birth: 04/14/1985  Today's Date: 10/29/2017 PT Individual Time: 1300-1400 PT Individual Time Calculation (min): 60 min   Short Term Goals: Week 1:  PT Short Term Goal 1 (Week 1): Pt will transfer bed<>chair w/ supervision PT Short Term Goal 2 (Week 1): Pt will tolerate 30 sec of keeping LLE in dependent position w/o increase in pain PT Short Term Goal 3 (Week 1): Pain will perform w/c parts management w/ supervision PT Short Term Goal 4 (Week 1): Pt will initiate gait and stair training  Skilled Therapeutic Interventions/Progress Updates:    c/o pain, does not rate, but reports premedicated and agreeable to therapy session.  Session focus on sit<>stand transfers with RW and UE ROM.   Pt propels w/c throughout unit up to 150' with BUEs for strengthening and activity tolerance, set up assist.  Transfers w/c<>mat with close supervision/min guard for squat/pivot.  Pt able to set up w/c and direct set up of w/c mod I.  Repeated sit<>stand with RW, supervision with verbal cues for hand placement, focus on tolerance of LLE in dependent position for improved independence/safety with transfers.  Pt completes 2x5 reps with up to 45 seconds standing tolerance.  UE ROM without resistance for RUE x10-15 reps focus on pain-free ROM, bicep curls, shoulder abduction, shoulder ER at 0 degrees of abduction and at 45 degrees of abduction.  PT applied ice to R shoulder at end of session.  Pt returned to room at end of session, call bell in reach and needs met.   Therapy Documentation Precautions:  Precautions Precautions: Fall Required Braces or Orthoses: Other Brace/Splint Other Brace/Splint: Ace wrap around L foot, not to get wet Restrictions Weight Bearing Restrictions: Yes RUE Weight Bearing: Weight bearing as tolerated LLE Weight Bearing: Non weight bearing    Therapy/Group: Individual  Therapy  Michel Santee 10/29/2017, 2:05 PM

## 2017-10-29 NOTE — Plan of Care (Signed)
  Problem: Consults Goal: RH GENERAL PATIENT EDUCATION Description See Patient Education module for education specifics. Outcome: Progressing Goal: Skin Care Protocol Initiated - if Braden Score 18 or less Description If consults are not indicated, leave blank or document N/A Outcome: Progressing   Problem: RH BOWEL ELIMINATION Goal: RH STG MANAGE BOWEL WITH ASSISTANCE Description STG Manage Bowel with min Assistance.  Outcome: Progressing Goal: RH STG MANAGE BOWEL W/MEDICATION W/ASSISTANCE Description STG Manage Bowel with Medication with min Assistance.  Outcome: Progressing   Problem: RH BLADDER ELIMINATION Goal: RH STG MANAGE BLADDER WITH ASSISTANCE Description STG Manage Bladder With min  Assistance  Outcome: Progressing Goal: RH STG MANAGE BLADDER WITH EQUIPMENT WITH ASSISTANCE Description STG Manage Bladder With Equipment With min Assistance  Outcome: Progressing   Problem: RH SKIN INTEGRITY Goal: RH STG SKIN FREE OF INFECTION/BREAKDOWN Description Skin free of further breakdown and infection with mod assist  Outcome: Progressing Goal: RH STG MAINTAIN SKIN INTEGRITY WITH ASSISTANCE Description STG Maintain Skin Integrity With mod Assistance.   Outcome: Progressing Goal: RH STG ABLE TO PERFORM INCISION/WOUND CARE W/ASSISTANCE Description STG Able To Perform Incision/Wound Care With mod Assistance.  Outcome: Progressing   Problem: RH SAFETY Goal: RH STG ADHERE TO SAFETY PRECAUTIONS W/ASSISTANCE/DEVICE Description STG Adhere to Safety Precautions With min Assistance/Device.  Outcome: Progressing Goal: RH STG DECREASED RISK OF FALL WITH ASSISTANCE Description STG Decreased Risk of Fall With min Assistance.  Outcome: Progressing   Problem: RH PAIN MANAGEMENT Goal: RH STG PAIN MANAGED AT OR BELOW PT'S PAIN GOAL Description Less than 4  Outcome: Progressing   Problem: RH KNOWLEDGE DEFICIT GENERAL Goal: RH STG INCREASE KNOWLEDGE OF SELF CARE AFTER  HOSPITALIZATION Description Patient will be able to verbalize care of wound, signs of infection with cues/handouts   Outcome: Progressing

## 2017-10-30 ENCOUNTER — Inpatient Hospital Stay (HOSPITAL_COMMUNITY): Payer: Self-pay | Admitting: *Deleted

## 2017-10-30 ENCOUNTER — Inpatient Hospital Stay (HOSPITAL_COMMUNITY): Payer: Self-pay | Admitting: Physical Therapy

## 2017-10-30 ENCOUNTER — Inpatient Hospital Stay (HOSPITAL_COMMUNITY): Payer: Self-pay | Admitting: Occupational Therapy

## 2017-10-30 MED ORDER — OXYCODONE HCL 5 MG PO TABS
10.0000 mg | ORAL_TABLET | ORAL | Status: DC | PRN
  Administered 2017-10-30 – 2017-11-01 (×7): 10 mg via ORAL
  Filled 2017-10-30 (×7): qty 2
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
## 2017-10-30 NOTE — Progress Notes (Addendum)
Social Work Patient ID: [name], female   DOB: 04-May-1985, 32 y.o.   MRN: [id number]  Met with pt to inform team conference goals mod/i wheelchair level and target discharge date 11/2. She hopes to go home before this but understands the issue with getting up five steps into her Mom's home. Will work on discharge needs and she is learning how to do her foot dressing changes with nursing. Work on discharge needs and se if can move up discharge date next week.

## 2017-10-30 NOTE — Progress Notes (Signed)
Patient unwrapped her foot and assisted with dressing change for first time, first time seeing foot. Tolerated well.

## 2017-10-30 NOTE — Plan of Care (Signed)
  Problem: RH SAFETY Goal: RH STG ADHERE TO SAFETY PRECAUTIONS W/ASSISTANCE/DEVICE Description STG Adhere to Safety Precautions With min Assistance/Device.  Outcome: Progressing Goal: RH STG DECREASED RISK OF FALL WITH ASSISTANCE Description STG Decreased Risk of Fall With min Assistance.  Outcome: Progressing  Call light within reach, bed/chair alarm, proper footwear.  

## 2017-10-30 NOTE — Progress Notes (Signed)
Physical Therapy Session Note  Patient Details  Name: [name] MRN: [id number] Date of Birth: [birth year]  Today's Date: 10/30/2017 PT Individual Time: 1100-1155 PT Individual Time Calculation (min): 55 min   Short Term Goals: Week 1:  PT Short Term Goal 1 (Week 1): Pt will transfer bed<>chair w/ supervision PT Short Term Goal 2 (Week 1): Pt will tolerate 30 sec of keeping LLE in dependent position w/o increase in pain PT Short Term Goal 3 (Week 1): Pain will perform w/c parts management w/ supervision PT Short Term Goal 4 (Week 1): Pt will initiate gait and stair training  Skilled Therapeutic Interventions/Progress Updates: Pt received in w/c, c/o pain 7/10 pre-medicated and agreeable to treatment. W/c propulsion 2x150' with BUE for strengthening and endurance. Gait trial x10' including turns with RW and min guard, maintains NWB LLE without cues or additional assistance. While resting with LLE elevated for pain relief; discussed home access and possibilities for getting up/down steps if ramp not available. Therapist and pt viewed home entryway on google maps; stairs very similar to steps in rehab gym, although seeing picture reminded pt of small dip immediately prior to platform at bottom of steps; problem solved ways to access bottom platform while avoiding small dip. Pt reports family would be able to push w/c through the grass to the side. Demo'ed ascent/descent steps with shower chair; will discuss with team regarding equipment needs and access to shower chair as it is a good option if she cannot get a ramp. Ascent/descent one 3" step with BUE on rails and min guard; mild increase in L foot pain during step up. Educated regarding scapular depression and elevating body through WB in UEs rather than "hopping" up with RLE to reduce impact and pain. Stand pivot w/c <>nustep with min guard. Performed BUE/RLE nustep level 3 with average 30 steps/min for focus on aerobic endurance, RUE  strengthening/ROM. Returned to room with w/c propulsion x300' for strengthening and endurance. Provided bag on back of w/c to hold pt belongings. Remained in w/c at end of session, all needs in reach.      Therapy Documentation Precautions:  Precautions Precautions: Fall Required Braces or Orthoses: Other Brace/Splint Other Brace/Splint: Ace wrap around L foot, not to get wet Restrictions Weight Bearing Restrictions: Yes RUE Weight Bearing: Weight bearing as tolerated LLE Weight Bearing: Non weight bearing Pain: Pain Assessment Pain Scale: 0-10 Pain Score: Asleep Pain Type: Surgical pain Pain Location: Foot Pain Orientation: Left Pain Intervention(s): Medication (See eMAR)    Therapy/Group: Individual Therapy  Harlon Ditty 10/30/2017, 8:47 AM

## 2017-10-30 NOTE — Progress Notes (Signed)
Physical Therapy Session Note  Patient Details  Name: [name] MRN: [id number] Date of Birth: 13-Jul-1985  Today's Date: 10/30/2017 PT Individual Time: 4098-1191 PT Individual Time Calculation (min): 45 min   Short Term Goals: Week 1:  PT Short Term Goal 1 (Week 1): Pt will transfer bed<>chair w/ supervision PT Short Term Goal 2 (Week 1): Pt will tolerate 30 sec of keeping LLE in dependent position w/o increase in pain PT Short Term Goal 3 (Week 1): Pain will perform w/c parts management w/ supervision PT Short Term Goal 4 (Week 1): Pt will initiate gait and stair training  Skilled Therapeutic Interventions/Progress Updates:    Pt received laying with HOB elevated with boyfriend present; pain as described below; agreeable to PT. ModI bed mobility. S squat pivot transfer without RW to w/c. Independent hygiene at sink. W/c parts managed with verbal cueing/instruction. Nurse arrived to administer pain medications. ModI w/c propulsion x 150' to rehab gym. S squat pivot transfer without RW throughout session. Core exercises to target and strengthen upper/lower abdominals and obliques: mini crunches, reverse crunches (second set modified to one leg at a time due to difficulty), and mini crunch with lower trunk rotated; 2x15 each. 3 standing trials with S in parallel bars with LLE in dependent position: trial 1) 15 sec, trial 2) 20 sec, trial 3) 25 sec. Pain increased significantly; pt reports" it feels like lots of knives are stabbing my foot." Pt sat in w/c with LLE elevated to ease pain. ModI w/c propulsion around unit x 250' for UE endurance; totalA w/c transport for remaining distance to room due to fatigue. Pt left sitting in w/c with LLE elevated, boyfriend present, and all needs in reach at the end of the session.   Therapy Documentation Precautions:  Precautions Precautions: Fall Required Braces or Orthoses: Other Brace/Splint Other Brace/Splint: Ace wrap around L foot, not to  get wet Restrictions Weight Bearing Restrictions: Yes RUE Weight Bearing: Weight bearing as tolerated LLE Weight Bearing: Non weight bearing Pain: Pain Assessment Pain Scale: 0-10 Pain Score: 10-Worst pain ever Pain Type: Surgical pain Pain Location: Foot Pain Orientation: Left;Lower Pain Descriptors / Indicators: Pounding Pain Frequency: Constant Pain Onset: On-going Pain Intervention(s): Medication (See eMAR)   Therapy/Group: Individual Therapy  [place] Rexann Lueras 10/30/2017, 11:06 AM

## 2017-10-30 NOTE — Progress Notes (Signed)
Occupational Therapy Session Note  Patient Details  Name: [name] MRN: [id number] Date of Birth: 11-25-1985  Today's Date: 10/30/2017 OT Individual Time: 4098-1191 OT Individual Time Calculation (min): 60 min    Short Term Goals: Week 1:  OT Short Term Goal 1 (Week 1): Pt will don pants with CGA OT Short Term Goal 2 (Week 1): Pt will transfer to toilet with CGA OT Short Term Goal 3 (Week 1): Pt will report pain no greater than 4/10 in LLE during ADL transfers OT Short Term Goal 4 (Week 1): Pt will use R UE to feed self 100% of meal with set up only  Skilled Therapeutic Interventions/Progress Updates:    Pt sitting in w/c upon entry with reports of 7/10 pain in bottom of L foot however current on all pain meds and agreeable to therapy session. Pt performed w/c mobility with focus on BUE strength throughout session with supervision. Pt performed stand pivot transfers with RW and CGA-min A with pt using R foot in sliding motion for pivot majority of time vs clearing RLE from floor. Discussed with pt regarding home set up with pt reporting she does not think w/c will fit through doorways- w/c home mgmt sheet given to pt for family to measure home. Therapist demonstrated tub/shower transfer with TTB with pt able to return demonstrate with min A for safety and balance- pt able to clear BLE's over tub ledge once in sitting position with supervision. Pt with reports of mother purchasing removable shower head following d/c.   In therapy gym, pt participated in dynamic standing balance activity at RW with CGA-min A for safety and balance with focus on increasing activity tolerance with LLE in dependent position- pt able to tolerate standing at RW x50 secs before req seated rest break with LLE elevated.    Pt with increased AROM for shoulder flexion this session in RUE. x6 reps of shoulder flexion <> extension with bilateral hands placed on 1# dowel rod in order to increase full ROM with  increased time. Pt with increased difficulty performing shoulder extension following flexion req encouragement from therapist for breath support strategies and slow/controlled decent. Lastly, seated EOM pt completed shoulder flexion task reaching for cards at various heights with focus on AROM shoulder flexion and tolerance for LLE to be in lowered position throughout task. Pt then transferred back to room as mentioned above and left seated in w/c with all needs in reach and boyfriend present.   Therapy Documentation Precautions:  Precautions Precautions: Fall Required Braces or Orthoses: Other Brace/Splint Other Brace/Splint: Ace wrap around L foot, not to get wet Restrictions Weight Bearing Restrictions: Yes RUE Weight Bearing: Weight bearing as tolerated LLE Weight Bearing: Non weight bearing   Therapy/Group: Individual Therapy  Treyvon Blahut 10/30/2017, 7:04 AM

## 2017-10-30 NOTE — Progress Notes (Signed)
[place] PHYSICAL MEDICINE & REHABILITATION PROGRESS NOTE  Subjective/Complaints: Patient seen sitting up in bed this morning.  She states she slept well overnight.  ROS: Denies CP, SOB, N/V/D.  Objective: Vital Signs: Blood pressure (!) 131/59, pulse 69, temperature 98.2 F (36.8 C), temperature source Oral, resp. rate 16, weight 122.4 kg, SpO2 98 %. No results found. Recent Labs    10/29/17 0507  WBC 8.8  HGB 12.8  HCT 40.1  PLT 331   Recent Labs    10/29/17 0507  NA 137  K 4.0  CL 104  CO2 25  GLUCOSE 119*  BUN 10  CREATININE 0.55  CALCIUM 9.4    Physical Exam: BP (!) 131/59 (BP Location: Left Arm)   Pulse 69   Temp 98.2 F (36.8 C) (Oral)   Resp 16   Wt 122.4 kg   SpO2 98%   BMI 42.26 kg/m  Constitutional: She appearswell-developedand well-nourished.Morbidly obese female. NAD. HENT: Normocephalic.  Atraumatic. Eyes:EOMI. No discharge.  Cardiovascular:RRR.  No JVD. Respiratory:Effort normal.  Clear. WU:[id number]. Bowel sounds normal.  Musculoskeletal: LLE tenderness Right shoulder tenderness Neurological:  Alert and oriented Motor: LUE/RLE 4+/5.  RUE limited by pain proximally >/4-/5, 4/5 distally Left lower extremity: Hip flexion, knee extension 4/5, ankle dorsiflexion 3-/5 (pain inhibition) Skin:Left foot with the dressing C/D/I. Proximal right upper extremity with hematoma Psychiatric: She has anormal mood and affect. Herbehavior is normal.Judgmentand thought contentnormal.   Assessment/Plan: 1. Functional deficits secondary to multi-Ortho which require 3+ hours per day of interdisciplinary therapy in a comprehensive inpatient rehab setting.  Physiatrist is providing close team supervision and 24 hour management of active medical problems listed below.  Physiatrist and rehab team continue to assess barriers to discharge/monitor patient progress toward functional and medical goals  Care Tool:  Bathing    Body parts  bathed by patient: Right arm, Left arm, Chest, Front perineal area, Abdomen, Face, Left lower leg, Right lower leg, Left upper leg, Buttocks, Right upper leg         Bathing assist Assist Level: Supervision/Verbal cueing     Upper Body Dressing/Undressing Upper body dressing   What is the patient wearing?: Pull over shirt    Upper body assist Assist Level: Set up assist    Lower Body Dressing/Undressing Lower body dressing      What is the patient wearing?: Pants     Lower body assist Assist for lower body dressing: Contact Guard/Touching assist     Toileting Toileting    Toileting assist Assist for toileting: Minimal Assistance - Patient > 75%     Transfers Chair/bed transfer  Transfers assist     Chair/bed transfer assist level: Contact Guard/Touching assist     Locomotion Ambulation   Ambulation assist   Ambulation activity did not occur: Safety/medical concerns          Walk 10 feet activity   Assist  Walk 10 feet activity did not occur: Safety/medical concerns        Walk 50 feet activity   Assist Walk 50 feet with 2 turns activity did not occur: Safety/medical concerns         Walk 150 feet activity   Assist Walk 150 feet activity did not occur: Safety/medical concerns         Walk 10 feet on uneven surface  activity   Assist Walk 10 feet on uneven surfaces activity did not occur: Safety/medical concerns         Wheelchair  Assist Will patient use wheelchair at discharge?: Yes Type of Wheelchair: Manual    Wheelchair assist level: Set up assist Max wheelchair distance: 150    Wheelchair 50 feet with 2 turns activity    Assist        Assist Level: Set up assist   Wheelchair 150 feet activity     Assist Wheelchair 150 feet activity did not occur: Safety/medical concerns   Assist Level: Set up assist      Medical Problem List and Plan: 1.Functional deficitssecondary to left foot  multi-Ortho, right shoulder contusion  Continue CIR 2. DVT Prophylaxis/Anticoagulation: Pharmaceutical:Lovenox 3. Pain Management: Increased Neurontin to qid as neuropathy   Relatively controlled on 10/23 4. Mood:LCSW to follow for evaluation and support. Mood appears stable. D/ced valium and added klonopin prn for anxiety.  Relatively controlled on 10/23 5. Neuropsych: This patientiscapable of making decisions onherown behalf. 6. Skin/Wound Care:Monitor wound for healing. Maintain adequate nutritional and hydration status. 7. Fluids/Electrolytes/Nutrition:Monitor I/O.   BMP within acceptable range on 10/22 8. Right shoulder contusion: Local measures with ice and Voltaren gel qid.  9. Leucocytosis: Resolved 10. ABLA: Resolved  Hemoglobin 12.8 on 10/22  Continue to monitor 11. Morbid obesity: Educate on importance of weight loss to help promote mobility and overall health. Consulted dietician for education. 12. Transaminitis  LFTs elevated on 10/19  Will order labs at the end of the week  Continue to monitor 13.  Hypoalbuminemia  Supplement initiated on 10/19  LOS: 5 days A FACE TO FACE EVALUATION WAS PERFORMED  Talan Gildner Karis Juba 10/30/2017, 8:44 AM

## 2017-10-30 NOTE — Progress Notes (Signed)
Physical Therapy Session Note  Patient Details  Name: [name] MRN: [id number] Date of Birth: [birth year]  Today's Date: 10/30/2017 PT Individual Time: 1545-1620 PT Individual Time Calculation (min): 35 min   Short Term Goals: Week 1:  PT Short Term Goal 1 (Week 1): Pt will transfer bed<>chair w/ supervision PT Short Term Goal 2 (Week 1): Pt will tolerate 30 sec of keeping LLE in dependent position w/o increase in pain PT Short Term Goal 3 (Week 1): Pain will perform w/c parts management w/ supervision PT Short Term Goal 4 (Week 1): Pt will initiate gait and stair training  Skilled Therapeutic Interventions/Progress Updates:    c/o 6/10 pain but declines intervention.  Session focus on w/c mobility, UE strengthening, and activity tolerance.  Pt transitions to EOB mod I and squat/pivot to w/c on L with supervision.  W/C propulsion to [employer]	[place] and back, total of 3 rest breaks along the way.  Educated on efficient stroke technique during w/c propulsion. Provided pt with handout for home measurements and reviewed with pt.  Returned to room at end of session and positioned upright in w/c with call bell in reach and needs met.   Therapy Documentation Precautions:  Precautions Precautions: Fall Required Braces or Orthoses: Other Brace/Splint Other Brace/Splint: Ace wrap around L foot, not to get wet Restrictions Weight Bearing Restrictions: Yes RUE Weight Bearing: Weight bearing as tolerated LLE Weight Bearing: Non weight bearing    Therapy/Group: Individual Therapy  Michel Santee 10/30/2017, 4:46 PM
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
## 2017-10-31 ENCOUNTER — Inpatient Hospital Stay (HOSPITAL_COMMUNITY): Payer: Self-pay | Admitting: Physical Therapy

## 2017-10-31 ENCOUNTER — Inpatient Hospital Stay (HOSPITAL_COMMUNITY): Payer: Self-pay | Admitting: Occupational Therapy
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
## 2017-10-31 NOTE — Progress Notes (Signed)
Physical Therapy Session Note  Patient Details  Name: [name] MRN: [id number] Date of Birth: Nov 28, 1985  Today's Date: 10/31/2017 PT Individual Time: 1000-1100 PT Individual Time Calculation (min): 60 min   Short Term Goals: Week 1:  PT Short Term Goal 1 (Week 1): Pt will transfer bed<>chair w/ supervision PT Short Term Goal 2 (Week 1): Pt will tolerate 30 sec of keeping LLE in dependent position w/o increase in pain PT Short Term Goal 3 (Week 1): Pain will perform w/c parts management w/ supervision PT Short Term Goal 4 (Week 1): Pt will initiate gait and stair training  Skilled Therapeutic Interventions/Progress Updates:    Pt received laying in bed with HOB and LLE elevated with boyfriend present; didn't attend session. Pain 8/10 in L foot; agreeable to PT treatment. Pt able to dress self independently. ModI bed mobility and supervision squat pivot transfer to w/c. Pt able to manage w/c parts with some assistance/direction. ModI w/c propulsion 150' to/from with BUE for strengthening and endurance. CGA Stair hopping on 3 in steps with RLE and BUE support to maintain NWB precaution of LLE; 3x5. Shoulder flexion, abduction, and rows with theraband for shoulder ROM/strengthening while seated in w/c with LLE lowered to 45 degrees. Sidelying clamshells with theraband, supine SAQ, supine SLR, and seated LAQ for LE strengthening. 2 standing trials with supervision and RW with LLE in dependent position; able to tolerate 1-1.5 mins. Stabbing pain increased to 10/10 after this activity. ModI w/c propulsion around unit x 500' for endurance and UE strengthening. Pt remained sitting in w/c at the end of the session with all needs in reach.  Therapy Documentation Precautions:  Precautions Precautions: Fall Required Braces or Orthoses: Other Brace/Splint Other Brace/Splint: Ace wrap around L foot, not to get wet Restrictions Weight Bearing Restrictions: Yes RUE Weight Bearing: Weight  bearing as tolerated LLE Weight Bearing: Non weight bearing Pain: Pain Assessment Pain Scale: 0-10 Pain Score: 8  Pain Type: Surgical pain Pain Location: Foot Pain Orientation: Left Pain Descriptors / Indicators: Stabbing;Aching;Throbbing Pain Frequency: Constant Pain Onset: On-going Pain Intervention(s): Medication (See eMAR);Repositioned;Relaxation Multiple Pain Sites: No   Therapy/Group: Individual Therapy  [place] [facility] Reidinger 10/31/2017, 2:35 PM

## 2017-10-31 NOTE — Progress Notes (Signed)
Physical Therapy Session Note  Patient Details  Name: [name] MRN: [id number] Date of Birth: [birth year]  Today's Date: 10/31/2017 PT Individual Time: 1415-1530 PT Individual Time Calculation (min): 75 min   Short Term Goals: Week 1:  PT Short Term Goal 1 (Week 1): Pt will transfer bed<>chair w/ supervision PT Short Term Goal 2 (Week 1): Pt will tolerate 30 sec of keeping LLE in dependent position w/o increase in pain PT Short Term Goal 3 (Week 1): Pain will perform w/c parts management w/ supervision PT Short Term Goal 4 (Week 1): Pt will initiate gait and stair training  Skilled Therapeutic Interventions/Progress Updates: Pt received in w/c, c/o pain as below and agreeable to treatment. W/c propulsion to gym with BUE and S for strengthening and aerobic endurance. Attempted setup of BSC up stairs for home entry; BSC unable to fit on stairs d/t width of BSC vs steps; pt reports she is uncertain of depth of stairs. Then reports she wants to try hopping since she was able to hop up five 3" steps earlier today. Performed up/down four steps 6" height with B rails and min guard, min cues for technique. Pain following trial but pt reports it is "worth it" if it means she can get in and out of the house and go home sooner than anticipated d/c of next week. Stand pivot w/c <>mat table. Sit <>supine with modI. Supine chest press 3x10 reps with 6# weight LUE, 4# weight RUE d/t pain with heavier weight. Unable to perform tricep extension with weighted bar d/t anterior shoulder pain. Bicycle crunches 2x20 reps. Prone alternating UE/LE lift for glute/trunk extensor strengthening. BLE hamstring stretch in long sitting 2x1 min. BUE scapular rows 2x15 reps with level 2 theraband. Demonstrated to pt how to perform bicep curls, tricep extension, rows with theraband and door handle with door shut, and provided with level 3 and 4, educated re: progression of exercises. Stand pivot w/c <>nustep with S.  Performed BUE/RLE nustep x8 min total for focus on strengthening and aerobic endurance, level 4 with average 40 steps/min. Adjusted w/c brake for safety. Remained in w/c at end of session, all needs in reach.      Therapy Documentation Precautions:  Precautions Precautions: Fall Required Braces or Orthoses: Other Brace/Splint Other Brace/Splint: Ace wrap around L foot, not to get wet Restrictions Weight Bearing Restrictions: Yes RUE Weight Bearing: Weight bearing as tolerated LLE Weight Bearing: Non weight bearing Pain: Pain Assessment Pain Scale: 0-10 Pain Score: 6  Pain Type: Surgical pain Pain Location: Foot Pain Orientation: Left Pain Descriptors / Indicators: Stabbing;Aching;Throbbing Pain Frequency: Constant Pain Onset: On-going Pain Intervention(s): Medication (See eMAR);Repositioned;Relaxation Multiple Pain Sites: No    Therapy/Group: Individual Therapy  Harlon Ditty 10/31/2017, 3:36 PM

## 2017-10-31 NOTE — Progress Notes (Signed)
Occupational Therapy Session Note  Patient Details  Name: [name] MRN: [id number] Date of Birth: Jul 22, 1985  Today's Date: 10/31/2017 OT Individual Time:  1300-1400 (60 mins)       Short Term Goals: Week 1:  OT Short Term Goal 1 (Week 1): Pt will don pants with CGA OT Short Term Goal 2 (Week 1): Pt will transfer to toilet with CGA OT Short Term Goal 3 (Week 1): Pt will report pain no greater than 4/10 in LLE during ADL transfers OT Short Term Goal 4 (Week 1): Pt will use R UE to feed self 100% of meal with set up only  Skilled Therapeutic Interventions/Progress Updates:    Pt sitting EOB upon entry with L LE in figure 4 positioning- reports of 6/10 pain however pt current on pain medications. Pt participated in bathing session with functional transfers completed with CGA for safety with use of RW and grab bars at times at toilet and shower. Pt ambulated/"hopped" throughout session with RW from EOB - toilet - shower - w/c at doorway with CGA. Pt transferred to toilet and performed hygiene tasks while seated with LLE elevated for pain mgmt. Pt then transferred to shower and performed bathing tasks including peri hygiene seated with education provided using lateral leans for decreased fall risk. Pt transferred to w/c and then performed dressing tasks while seated except for pulling pants up req CGA in standing for safety. Pt then tolerated standing at sink ~3 mins with LLE in dependent position however seated rest break req following task.   At end of session, pt tearful due to severity of "sharp stabbing pain" in bottom of L foot- therapist provided emotional support and coached pt through breath support strategies for pain mgmt. Pt appreciative of therapy sessions with pt then reporting she is thankful for everyone helping her. Pt left seated in w/c with all needs in reach.   Safety plan changed to CGA for ambulation with RW to toilet dependent on pt's pain level/time of day vs  transport in w/c to toilet. RN notified of change in safety plan at end of session.   Therapy Documentation Precautions:  Precautions Precautions: Fall Required Braces or Orthoses: Other Brace/Splint Other Brace/Splint: Ace wrap around L foot, not to get wet Restrictions Weight Bearing Restrictions: Yes RUE Weight Bearing: Weight bearing as tolerated LLE Weight Bearing: Non weight bearing ADL: ADL Eating: Minimal assistance Where Assessed-Eating: Edge of bed Grooming: Contact guard Where Assessed-Grooming: Standing at sink Upper Body Bathing: Supervision/safety Where Assessed-Upper Body Bathing: Shower Lower Body Bathing: Supervision/safety Where Assessed-Lower Body Bathing: Shower Upper Body Dressing: Setup Where Assessed-Upper Body Dressing: Wheelchair Lower Body Dressing: Contact guard Where Assessed-Lower Body Dressing: Wheelchair Toileting: Contact guard Where Assessed-Toileting: Teacher, adult education: Furniture conservator/restorer Method: Proofreader: Acupuncturist: Administrator, arts Method: Designer, industrial/product: Emergency planning/management officer  Therapy/Group: Individual Therapy  Tashianna Broome 10/31/2017, 2:35 PM

## 2017-10-31 NOTE — Progress Notes (Signed)
[place] PHYSICAL MEDICINE & REHABILITATION PROGRESS NOTE  Subjective/Complaints: Patient seen laying in bed this AM.  She states she slept well overnight.  She is resting comfortable in bed; she states her pain is 7/10 when asked.   ROS: Denies CP, SOB, N/V/D.  Objective: Vital Signs: Blood pressure (!) 96/30, pulse 78, temperature 97.9 F (36.6 C), temperature source Oral, resp. rate 20, weight 122.4 kg, SpO2 98 %. No results found. Recent Labs    10/29/17 0507  WBC 8.8  HGB 12.8  HCT 40.1  PLT 331   Recent Labs    10/29/17 0507  NA 137  K 4.0  CL 104  CO2 25  GLUCOSE 119*  BUN 10  CREATININE 0.55  CALCIUM 9.4    Physical Exam: BP (!) 96/30 (BP Location: Right Arm)   Pulse 78   Temp 97.9 F (36.6 C) (Oral)   Resp 20   Wt 122.4 kg   SpO2 98%   BMI 42.26 kg/m  Constitutional: She appearswell-developedand well-nourished.Morbidly obese female. NAD. HENT: Normocephalic.  Atraumatic. Eyes:EOMI. No discharge.  Cardiovascular:RRR. No JVD. Respiratory:Effort normal. Clear. WU:[id number]. Bowel sounds normal.  Musculoskeletal: LLE tenderness Right shoulder tenderness Neurological:  Alert and oriented Motor: LUE/RLE 4+/5.  RUE limited by pain proximally >/4-/5, 4/5 distally, stable Left lower extremity: Hip flexion, knee extension, 4/5, ankle dorsiflexion 3-/5 (pain inhibition), stable Skin:Left foot with the dressing C/D/I. Proximal right upper extremity with hematoma Psychiatric: She has anormal mood and affect. Herbehavior is normal.Judgmentand thought contentnormal.   Assessment/Plan: 1. Functional deficits secondary to multi-Ortho which require 3+ hours per day of interdisciplinary therapy in a comprehensive inpatient rehab setting.  Physiatrist is providing close team supervision and 24 hour management of active medical problems listed below.  Physiatrist and rehab team continue to assess barriers to discharge/monitor patient progress  toward functional and medical goals  Care Tool:  Bathing    Body parts bathed by patient: Right arm, Left arm, Chest, Front perineal area, Abdomen, Face, Left lower leg, Right lower leg, Left upper leg, Buttocks, Right upper leg         Bathing assist Assist Level: Supervision/Verbal cueing     Upper Body Dressing/Undressing Upper body dressing   What is the patient wearing?: Pull over shirt    Upper body assist Assist Level: Set up assist    Lower Body Dressing/Undressing Lower body dressing      What is the patient wearing?: Pants     Lower body assist Assist for lower body dressing: Contact Guard/Touching assist     Toileting Toileting    Toileting assist Assist for toileting: Minimal Assistance - Patient > 75%     Transfers Chair/bed transfer  Transfers assist     Chair/bed transfer assist level: Supervision/Verbal cueing     Locomotion Ambulation   Ambulation assist   Ambulation activity did not occur: Safety/medical concerns  Assist level: Contact Guard/Touching assist Assistive device: Walker-rolling Max distance: 10   Walk 10 feet activity   Assist  Walk 10 feet activity did not occur: Safety/medical concerns  Assist level: Contact Guard/Touching assist     Walk 50 feet activity   Assist Walk 50 feet with 2 turns activity did not occur: Safety/medical concerns         Walk 150 feet activity   Assist Walk 150 feet activity did not occur: Safety/medical concerns         Walk 10 feet on uneven surface  activity   Assist Walk 10 feet  on uneven surfaces activity did not occur: Safety/medical concerns         Wheelchair     Assist Will patient use wheelchair at discharge?: Yes Type of Wheelchair: Manual    Wheelchair assist level: Set up assist Max wheelchair distance: 250    Wheelchair 50 feet with 2 turns activity    Assist        Assist Level: Set up assist   Wheelchair 150 feet activity      Assist Wheelchair 150 feet activity did not occur: Safety/medical concerns   Assist Level: Set up assist      Medical Problem List and Plan: 1.Functional deficitssecondary to left foot multi-Ortho, right shoulder contusion  Continue CIR 2. DVT Prophylaxis/Anticoagulation: Pharmaceutical:Lovenox 3. 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 Mood:LCSW to follow for evaluation and support. Mood appears stable. D/ced valium and added klonopin prn for anxiety.  Relatively controlled on 10/23 5. Neuropsych: This patientiscapable of making decisions onherown behalf. 6. Skin/Wound Care:Monitor wound for healing. Maintain adequate nutritional and hydration status. 7. Fluids/Electrolytes/Nutrition:Monitor I/O.   BMP within acceptable range on 10/22 8. Right shoulder contusion: Local measures with ice and Voltaren gel qid.  9. Leucocytosis: Resolved 10. ABLA: Resolved  Hemoglobin 12.8 on 10/22  Continue to monitor 11. Morbid obesity: Educate on importance of weight loss to help promote mobility and overall health. Consulted dietician for education. 12. Transaminitis  LFTs elevated on 10/19  Labs ordered for tomorrow  Continue to monitor 13.  Hypoalbuminemia  Supplement initiated on 10/19  LOS: 6 days A FACE TO FACE EVALUATION WAS PERFORMED  Ankit Karis Juba 10/31/2017, 8:47 AM

## 2017-10-31 NOTE — Plan of Care (Signed)
  Problem: RH PAIN MANAGEMENT Goal: RH STG PAIN MANAGED AT OR BELOW PT'S PAIN GOAL Description Less than 4  Outcome: Not Progressing; pqin med q 4 hours

## 2017-11-01 ENCOUNTER — Inpatient Hospital Stay (HOSPITAL_COMMUNITY): Payer: Self-pay

## 2017-11-01 ENCOUNTER — Inpatient Hospital Stay (HOSPITAL_COMMUNITY): Payer: Self-pay | Admitting: Physical Therapy

## 2017-11-01 LAB — COMPREHENSIVE METABOLIC PANEL
ALBUMIN: 3.2 g/dL — AB (ref 3.5–5.0)
ALT: 38 U/L (ref 0–44)
AST: 18 U/L (ref 15–41)
Alkaline Phosphatase: 54 U/L (ref 38–126)
Anion gap: 13 (ref 5–15)
BILIRUBIN TOTAL: 0.4 mg/dL (ref 0.3–1.2)
BUN: 16 mg/dL (ref 6–20)
CO2: 17 mmol/L — ABNORMAL LOW (ref 22–32)
CREATININE: 0.52 mg/dL (ref 0.44–1.00)
Calcium: 9 mg/dL (ref 8.9–10.3)
Chloride: 110 mmol/L (ref 98–111)
GFR calc Af Amer: >60 mL/min (ref >60)
GLUCOSE: 95 mg/dL (ref 70–99)
POTASSIUM: 3.8 mmol/L (ref 3.5–5.1)
Sodium: 140 mmol/L (ref 135–145)
TOTAL PROTEIN: 5.9 g/dL — AB (ref 6.5–8.1)

## 2017-11-01 MED ORDER — OXYCODONE HCL 5 MG PO TABS
5.0000 mg | ORAL_TABLET | Freq: Four times a day (QID) | ORAL | Status: DC | PRN
  Administered 2017-11-01 – 2017-11-04 (×8): 5 mg via ORAL
  Filled 2017-11-01 (×8): qty 1

## 2017-11-01 NOTE — Progress Notes (Signed)
[place] PHYSICAL MEDICINE & REHABILITATION PROGRESS NOTE  Subjective/Complaints: Patient seen laying in bed this morning.  States she slept well overnight.  She notes improvement with Lidoderm patch.  Discussed coping strategies.  ROS: Denies CP, SOB, N/V/D.  Objective: Vital Signs: Blood pressure 103/87, pulse 96, temperature 97.9 F (36.6 C), resp. rate 18, weight 122.4 kg, SpO2 95 %. No results found. No results for input(s): WBC, HGB, HCT, PLT in the last 72 hours. Recent Labs    11/01/17 0537  NA 140  K 3.8  CL 110  CO2 17*  GLUCOSE 95  BUN 16  CREATININE 0.52  CALCIUM 9.0    Physical Exam: BP 103/87 (BP Location: Left Arm)   Pulse 96   Temp 97.9 F (36.6 C)   Resp 18   Wt 122.4 kg   SpO2 95%   BMI 42.26 kg/m  Constitutional: She appearswell-developedand well-nourished.Morbidly obese female. NAD. HENT: Normocephalic.  Atraumatic. Eyes:EOMI. No discharge.  Cardiovascular:RRR.  No JVD. Respiratory:Effort normal.  Clear. ZO:[id number]. Bowel sounds normal.  Musculoskeletal: LLE tenderness Right shoulder tenderness, improving Neurological:  Alert and oriented Motor: LUE/RLE 4+/5.  RUE limited by pain proximally >/4-/5, 4/5 distally, unchanged Left lower extremity: Hip flexion, knee extension, 4/5, ankle dorsiflexion 3-/5 (pain inhibition), unchanged Skin:Left foot with the dressing C/D/I. Proximal right upper extremity with hematoma, improving Psychiatric: She has anormal mood and affect. Herbehavior is normal.Judgmentand thought contentnormal.   Assessment/Plan: 1. Functional deficits secondary to multi-Ortho which require 3+ hours per day of interdisciplinary therapy in a comprehensive inpatient rehab setting.  Physiatrist is providing close team supervision and 24 hour management of active medical problems listed below.  Physiatrist and rehab team continue to assess barriers to discharge/monitor patient progress toward functional and  medical goals  Care Tool:  Bathing    Body parts bathed by patient: Right arm, Left arm, Chest, Front perineal area, Abdomen, Face, Left lower leg, Right lower leg, Left upper leg, Buttocks, Right upper leg         Bathing assist Assist Level: Supervision/Verbal cueing     Upper Body Dressing/Undressing Upper body dressing   What is the patient wearing?: Pull over shirt    Upper body assist Assist Level: Set up assist    Lower Body Dressing/Undressing Lower body dressing      What is the patient wearing?: Pants     Lower body assist Assist for lower body dressing: Contact Guard/Touching assist     Toileting Toileting    Toileting assist Assist for toileting: Contact Guard/Touching assist     Transfers Chair/bed transfer  Transfers assist     Chair/bed transfer assist level: Supervision/Verbal cueing     Locomotion Ambulation   Ambulation assist   Ambulation activity did not occur: Safety/medical concerns  Assist level: Contact Guard/Touching assist Assistive device: Walker-rolling Max distance: 10   Walk 10 feet activity   Assist  Walk 10 feet activity did not occur: Safety/medical concerns  Assist level: Contact Guard/Touching assist     Walk 50 feet activity   Assist Walk 50 feet with 2 turns activity did not occur: Safety/medical concerns         Walk 150 feet activity   Assist Walk 150 feet activity did not occur: Safety/medical concerns         Walk 10 feet on uneven surface  activity   Assist Walk 10 feet on uneven surfaces activity did not occur: Safety/medical concerns         Wheelchair  Assist Will patient use wheelchair at discharge?: Yes Type of Wheelchair: Manual    Wheelchair assist level: Set up assist Max wheelchair distance: 400    Wheelchair 50 feet with 2 turns activity    Assist        Assist Level: Set up assist   Wheelchair 150 feet activity     Assist Wheelchair 150  feet activity did not occur: Safety/medical concerns   Assist Level: Set up assist      Medical Problem List and Plan: 1.Functional deficitssecondary to left foot multi-Ortho, right shoulder contusion  Continue CIR 2. DVT Prophylaxis/Anticoagulation: Pharmaceutical:Lovenox 3. 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 Mood:LCSW to follow for evaluation and support. Mood appears stable. D/ced valium and added klonopin prn for anxiety.  Relatively controlled on 10/25 5. Neuropsych: This patientiscapable of making decisions onherown behalf. 6. Skin/Wound Care:Monitor wound for healing. Maintain adequate nutritional and hydration status. 7. Fluids/Electrolytes/Nutrition:Monitor I/O.   BMP within acceptable range on 10/25 8. Right shoulder contusion: Local measures with ice and Voltaren gel qid.  9. Leucocytosis: Resolved 10. ABLA: Resolved  Hemoglobin 12.8 on 10/22  Continue to monitor 11. Morbid obesity: Educate on importance of weight loss to help promote mobility and overall health. Consulted dietician for education. 12. Transaminitis: Resolved  LFTs within normal limits on 10/25  Continue to monitor 13.  Hypoalbuminemia  Supplement initiated on 10/19  LOS: 7 days A FACE TO FACE EVALUATION WAS PERFORMED  Ankit Karis Juba 11/01/2017, 8:56 AM

## 2017-11-01 NOTE — Progress Notes (Signed)
Occupational Therapy Session Note  Patient Details  Name: [name] MRN: [id number] Date of Birth: 1985-11-17  Today's Date: 11/01/2017 OT Individual Time: 0945-1100 OT Individual Time Calculation (min): 75 min    Short Term Goals: Week 1:  OT Short Term Goal 1 (Week 1): Pt will don pants with CGA OT Short Term Goal 2 (Week 1): Pt will transfer to toilet with CGA OT Short Term Goal 3 (Week 1): Pt will report pain no greater than 4/10 in LLE during ADL transfers OT Short Term Goal 4 (Week 1): Pt will use R UE to feed self 100% of meal with set up only  Skilled Therapeutic Interventions/Progress Updates:    Ot intervention with focus on functional transfsers, w/c setup/safety, and R shoulder pain management/function.  Pt continues to c/o increased pain in R shoulder with flexion above 90 and adduction/abduction.  PROM/AAROM with myofascial release and soft tissue mobilizations provided temporary relief.  Pt able to accomplish tasks (w/c mobility, sit<>stand,standing with RW, and functional transfers) within pain threshold. Pt premedicated prior to therapy.   Therapy Documentation Precautions:  Precautions Precautions: Fall Required Braces or Orthoses: Other Brace/Splint Other Brace/Splint: Ace wrap around L foot, not to get wet Restrictions Weight Bearing Restrictions: Yes RUE Weight Bearing: Weight bearing as tolerated LLE Weight Bearing: Non weight bearing   Pain: Pain Assessment Pain Scale: 0-10 Pain Score: 7  Pain Type: Surgical pain Pain Location: Foot Pain Orientation: Left Pain Descriptors / Indicators: Aching Pain Frequency: Constant Pain Onset: On-going Patients Stated Pain Goal: 4 Pain Intervention(s): repositioned, PROM and soft tissue mobilizations Exercises: General Exercises - Upper Extremity Shoulder Flexion: AAROM;Right Shoulder Extension: AAROM;Right Shoulder ABduction: AAROM;Right Shoulder ADduction: AAROM;Right Shoulder Horizontal  ABduction: AAROM;Right Shoulder Horizontal ADduction: AAROM;Right Other Treatments:     Therapy/Group: Individual Therapy  Rich Brave 11/01/2017, 12:12 PM

## 2017-11-01 NOTE — Progress Notes (Addendum)
Social Work Patient ID: [name], female   DOB: May 31, 1985, 32 y.o.   MRN: [id number] Pt is making progress more quickly and have moved up discharge date to 10/30. MD aware and in agreement and pt very pleased. Will work toward discharge next Wed. Now changed to Tuesday 10/29

## 2017-11-01 NOTE — Progress Notes (Signed)
Physical Therapy Weekly Progress Note  Patient Details  Name: [name] MRN: [id number] Date of Birth: Mar 25, 1985  Beginning of progress report period: October 25, 2017 End of progress report period: November 01, 2017  Today's Date: 11/01/2017 PT Individual Time: 1100-1200 PT Individual Time Calculation (min): 60 min   Patient has met 4 of 4 short term goals.  Pt able to tolerate dependent position of LLE for longer periods of time (up to ~2 mins) before pain levels increase, allowing functional mobility to progress rather quickly. She is able to navigate 4 six inch stairs with CGA and BUE support via a hopping method. Still requires seated rest breaks with elevated LLE to decrease pain levels after activity. Pain is the biggest limiting factor.   Patient continues to demonstrate the following deficits muscle weakness and decreased cardiorespiratoy endurance and therefore will continue to benefit from skilled PT intervention to increase functional independence with mobility.  Patient progressing toward long term goals..  Continue plan of care.  PT Short Term Goals Week 1:  PT Short Term Goal 1 (Week 1): Pt will transfer bed<>chair w/ supervision PT Short Term Goal 1 - Progress (Week 1): Met PT Short Term Goal 2 (Week 1): Pt will tolerate 30 sec of keeping LLE in dependent position w/o increase in pain PT Short Term Goal 2 - Progress (Week 1): Met PT Short Term Goal 3 (Week 1): Pain will perform w/c parts management w/ supervision PT Short Term Goal 3 - Progress (Week 1): Met PT Short Term Goal 4 (Week 1): Pt will initiate gait and stair training PT Short Term Goal 4 - Progress (Week 1): Met Week 2:  PT Short Term Goal 1 (Week 2): = LTGs due to LOS  Skilled Therapeutic Interventions/Progress Updates:    Pt received sitting in w/c with 7/10 pain as described below; motivated for PT session. S sit>stand and stand pivot transfers with RW throughout session. CGA ambulation  throughout session with RW up to 150' while maintaining NWB precaution on LLE. Supervision car transfer with RW. Core exercises to target and strengthen upper/lower abdominals and obliques: mini crunches with LEs at 90/90, reverse crunches with BLE lifts, and modified russian twists with ball taps (reclined position with "knees towards chest"). CGA stair navigation with BUE support; 4 x 6 inch steps while maintaining NWB precautions. S w/c propulsion through obstacle course and in/around kitchen to maneuver smaller spaces similar to home environment; pt able to manage w/c parts with supervision. Returned to room via independent w/c propulsion x 200' and remained seated with lunch and all needs in reach.   Therapy Documentation Precautions:  Precautions Precautions: Fall Required Braces or Orthoses: Other Brace/Splint Other Brace/Splint: Ace wrap around L foot, not to get wet Restrictions Weight Bearing Restrictions: Yes RUE Weight Bearing: Weight bearing as tolerated LLE Weight Bearing: Non weight bearing Vital Signs: Therapy Vitals Temp: 98.1 F (36.7 C) Pulse Rate: 74 Resp: 16 BP: 140/68 Patient Position (if appropriate): Sitting Oxygen Therapy SpO2: 98 % O2 Device: Room Air Pain: Pain Assessment Pain Scale: 0-10 Pain Score: 7  Pain Type: Surgical pain Pain Location: Foot Pain Orientation: Left Pain Descriptors / Indicators: Aching;Throbbing;Stabbing Pain Onset: On-going Pain Intervention(s): Medication (See eMAR);Repositioned;Elevated extremity Multiple Pain Sites: No   Therapy/Group: Individual Therapy  [place] Daphyne Miguez 11/01/2017, 4:07 PM

## 2017-11-01 NOTE — Progress Notes (Signed)
Occupational Therapy Session Note  Patient Details  Name: [name] MRN: [id number] Date of Birth: [birth year]  Today's Date: 11/01/2017 OT Individual Time: 4098-1191 OT Individual Time Calculation (min): 55 min    Short Term Goals: Week 1:  OT Short Term Goal 1 (Week 1): Pt will don pants with CGA OT Short Term Goal 2 (Week 1): Pt will transfer to toilet with CGA OT Short Term Goal 3 (Week 1): Pt will report pain no greater than 4/10 in LLE during ADL transfers OT Short Term Goal 4 (Week 1): Pt will use R UE to feed self 100% of meal with set up only  Skilled Therapeutic Interventions/Progress Updates:    Pt resting in w/c upon arrival and ready for therapy.  OT intervention with focus on bed transfers/mobility, tub bench transfers, preparing simple snack/meal w/c level in kitchen, w/c safety, and activity tolerance.  Pt required min verbal cues for locking brakes when performing tasks at w/c level.  Pt educated on kitchen safety and verbalized understanding.  Pt practiced retrieving items from freezer and refrigerator as well as preparing microwave pizza and transporting at w/c level.  Pt also practiced bed tranfsers/mobility and tub bench transfers at supervision level.  Pt independent with w/c setup before and after transfers.  Pt c/o increased pain in R shoulder with activity.  RN aware. Pt remained in w/c with all needs within reach.  Therapy Documentation Precautions:  Precautions Precautions: Fall Required Braces or Orthoses: Other Brace/Splint Other Brace/Splint: Ace wrap around L foot, not to get wet Restrictions Weight Bearing Restrictions: Yes RUE Weight Bearing: Weight bearing as tolerated LLE Weight Bearing: Non weight bearing Pain: Pt c/o increased R shoulder pain (8/10) with flexion, adduction/abduction during activity; RN aware and emotional support provided  Therapy/Group: Individual Therapy  Rich Brave 11/01/2017, 2:36 PM

## 2017-11-02 ENCOUNTER — Inpatient Hospital Stay (HOSPITAL_COMMUNITY): Payer: Self-pay | Admitting: Physical Therapy

## 2017-11-02 MED ORDER — NORTRIPTYLINE HCL 25 MG PO CAPS
25.0000 mg | ORAL_CAPSULE | Freq: Every day | ORAL | Status: DC
  Administered 2017-11-02 – 2017-11-04 (×3): 25 mg via ORAL
  Filled 2017-11-02 (×4): qty 1

## 2017-11-02 NOTE — Plan of Care (Signed)
  Problem: RH PAIN MANAGEMENT Goal: RH STG PAIN MANAGED AT OR BELOW PT'S PAIN GOAL Description Less than 4  Outcome: Not Progressing; c/o constant pain lt foot

## 2017-11-02 NOTE — Progress Notes (Addendum)
Physical Therapy Session Note  Patient Details  Name: [name] MRN: [id number] Date of Birth: 24-Oct-1985  Today's Date: 11/02/2017 PT Individual Time: 0923-1003 PT Individual Time Calculation (min): 40 min   Short Term Goals: Week 2:  PT Short Term Goal 1 (Week 2): = LTGs due to LOS  Skilled Therapeutic Interventions/Progress Updates:  Pt recevied in w/c & agreeable to tx. Pt propels w/c around unit with mod I. Pt completes sit<>stand with supervision & ambulates 45 ft + 45 ft with RW & close supervision with occasional cuing to push up with 1UE vs placing BUE on RW during sit>stand and pt able to maintain NWB LLE during transfers & gait. Pt propels w/c room<>outside north tower with mod I, with therapist instructing her to back on elevator. Pt propels w/c around outside over uneven surfaces with mod I to simulate community mobility. Provided pt with w/c gloves to protect BUE during w/c propulsion. At end of session pt left sitting in w/c in room.  Therapy Documentation Precautions:  Precautions Precautions: Fall Required Braces or Orthoses: Other Brace/Splint Other Brace/Splint: Ace wrap around L foot, not to get wet Restrictions Weight Bearing Restrictions: Yes RUE Weight Bearing: Weight bearing as tolerated LLE Weight Bearing: Non weight bearing  Pain: Pt reports 8/10 pain in LLE but states she's premedicated. Pt also with increasing pain with LLE in dependent position & rest breaks & elevation provided PRN.   Therapy/Group: Individual Therapy  Sandi Mariscal 11/02/2017, 10:19 AM

## 2017-11-02 NOTE — Progress Notes (Signed)
[place] PHYSICAL MEDICINE & REHABILITATION PROGRESS NOTE  Subjective/Complaints: Pt complains of ongoing left foot pain. Describes it as stinging/burning  ROS: Patient denies fever, rash, sore throat, blurred vision, nausea, vomiting, diarrhea, cough, shortness of breath or chest pain,   headache, or mood change.    Objective: Vital Signs: Blood pressure 118/60, pulse 68, temperature 98 F (36.7 C), resp. rate 16, weight 122.4 kg, SpO2 99 %. No results found. No results for input(s): WBC, HGB, HCT, PLT in the last 72 hours. Recent Labs    11/01/17 0537  NA 140  K 3.8  CL 110  CO2 17*  GLUCOSE 95  BUN 16  CREATININE 0.52  CALCIUM 9.0    Physical Exam: BP 118/60 (BP Location: Left Arm)   Pulse 68   Temp 98 F (36.7 C)   Resp 16   Wt 122.4 kg   SpO2 99%   BMI 42.26 kg/m  Constitutional: No distress . Vital signs reviewed. HEENT: EOMI, oral membranes moist Neck: supple Cardiovascular: RRR without murmur. No JVD    Respiratory: CTA Bilaterally without wheezes or rales. Normal effort    GI: BS +, non-tender, non-distended  Musculoskeletal: Left foot tender Right shoulder tenderness, improving Neurological:  Alert and oriented Motor: LUE/RLE 4+/5.  RUE limited by pain proximally >/4-/5, 4/5 distally, stable Left lower extremity: Hip flexion, knee extension, 4/5, ankle dorsiflexion 3-/5 (pain inhibition), unchanged Skin:Left foot with the dressing C/D/I. Proximal right upper extremity with hematoma, stable to improved Psychiatric: pleasant   Assessment/Plan: 1. Functional deficits secondary to multi-Ortho which require 3+ hours per day of interdisciplinary therapy in a comprehensive inpatient rehab setting.  Physiatrist is providing close team supervision and 24 hour management of active medical problems listed below.  Physiatrist and rehab team continue to assess barriers to discharge/monitor patient progress toward functional and medical goals  Care  Tool:  Bathing    Body parts bathed by patient: Right arm, Left arm, Chest, Front perineal area, Abdomen, Face, Left lower leg, Right lower leg, Left upper leg, Buttocks, Right upper leg         Bathing assist Assist Level: Supervision/Verbal cueing     Upper Body Dressing/Undressing Upper body dressing   What is the patient wearing?: Pull over shirt    Upper body assist Assist Level: Set up assist    Lower Body Dressing/Undressing Lower body dressing      What is the patient wearing?: Pants     Lower body assist Assist for lower body dressing: Contact Guard/Touching assist     Toileting Toileting    Toileting assist Assist for toileting: Contact Guard/Touching assist     Transfers Chair/bed transfer  Transfers assist     Chair/bed transfer assist level: Supervision/Verbal cueing     Locomotion Ambulation   Ambulation assist   Ambulation activity did not occur: Safety/medical concerns  Assist level: Supervision/Verbal cueing Assistive device: Walker-rolling Max distance: 45 ft   Walk 10 feet activity   Assist  Walk 10 feet activity did not occur: Safety/medical concerns  Assist level: Supervision/Verbal cueing Assistive device: Walker-rolling   Walk 50 feet activity   Assist Walk 50 feet with 2 turns activity did not occur: Safety/medical concerns  Assist level: Contact Guard/Touching assist Assistive device: Walker-rolling    Walk 150 feet activity   Assist Walk 150 feet activity did not occur: Safety/medical concerns  Assist level: Contact Guard/Touching assist Assistive device: Walker-rolling    Walk 10 feet on uneven surface  activity  Assist Walk 10 feet on uneven surfaces activity did not occur: Safety/medical concerns         Wheelchair     Assist Will patient use wheelchair at discharge?: Yes Type of Wheelchair: Manual    Wheelchair assist level: Independent Max wheelchair distance: >150 ft      Wheelchair 50 feet with 2 turns activity    Assist        Assist Level: Independent   Wheelchair 150 feet activity     Assist Wheelchair 150 feet activity did not occur: Safety/medical concerns   Assist Level: Independent      Medical Problem List and Plan: 1.Functional deficitssecondary to left foot multi-Ortho, right shoulder contusion  Continue CIR 2. DVT Prophylaxis/Anticoagulation: Pharmaceutical:Lovenox 3. Pain Management: continue Neurontin to 400mg  qid     -add nortriptyline 25mg  qhs  -oxycodone 5mg  q6 prn (recently reduced)  Needs consistent motivation and coping strategies  Lidoderm patch ordered on 10/24, with some improvement improvement 4. Mood:LCSW to follow for evaluation and support. Mood appears stable. D/ced valium and added klonopin prn for anxiety.  Relatively controlled on 10/26 5. Neuropsych: This patientiscapable of making decisions onherown behalf. 6. Skin/Wound Care:Monitor wound for healing. Maintain adequate nutritional and hydration status. 7. Fluids/Electrolytes/Nutrition:Monitor I/O.   BMP within acceptable range on 10/25 8. Right shoulder contusion: Local measures with ice and Voltaren gel qid.  9. Leucocytosis: Resolved 10. ABLA: Resolved  Hemoglobin 12.8 on 10/22  Continue to monitor 11. Morbid obesity: Educate on importance of weight loss to help promote mobility and overall health. Consulted dietician for education. 12. Transaminitis: Resolved  LFTs within normal limits on 10/25  Continue to monitor 13.  Hypoalbuminemia  Supplement initiated on 10/19  LOS: 8 days A FACE TO FACE EVALUATION WAS PERFORMED  Ranelle Oyster 11/02/2017, 10:46 AM

## 2017-11-02 NOTE — Plan of Care (Signed)
  Problem: RH SKIN INTEGRITY Goal: RH STG SKIN FREE OF INFECTION/BREAKDOWN Description Skin free of further breakdown and infection with mod assist  Outcome: Progressing; pt did dressing change assisted by RN   Problem: RH SKIN INTEGRITY Goal: RH STG ABLE TO PERFORM INCISION/WOUND CARE W/ASSISTANCE Description STG Able To Perform Incision/Wound Care With mod Assistance.  11/02/2017 1115 by Pierre Bali, RN Outcome: Progressing; pt did dressing change assisted by RN 11/02/2017 0930 by Pierre Bali, RN Outcome: Progressing

## 2017-11-03 ENCOUNTER — Inpatient Hospital Stay (HOSPITAL_COMMUNITY): Payer: Self-pay | Admitting: Occupational Therapy

## 2017-11-03 NOTE — Progress Notes (Signed)
[place] PHYSICAL MEDICINE & REHABILITATION PROGRESS NOTE  Subjective/Complaints: Still dealing with pain. nortiptyline helped sleep last night. RN reports constant complaints of foot pain  ROS: Patient denies fever, rash, sore throat, blurred vision, nausea, vomiting, diarrhea, cough, shortness of breath or chest pain,   headache, or mood change.    Objective: Vital Signs: Blood pressure (!) 127/56, pulse 72, temperature 98.2 F (36.8 C), temperature source Oral, resp. rate 19, weight 122.4 kg, SpO2 99 %. No results found. No results for input(s): WBC, HGB, HCT, PLT in the last 72 hours. Recent Labs    11/01/17 0537  NA 140  K 3.8  CL 110  CO2 17*  GLUCOSE 95  BUN 16  CREATININE 0.52  CALCIUM 9.0    Physical Exam: BP (!) 127/56 (BP Location: Right Arm)   Pulse 72   Temp 98.2 F (36.8 C) (Oral)   Resp 19   Wt 122.4 kg   SpO2 99%   BMI 42.26 kg/m  Constitutional: No distress . Vital signs reviewed. HEENT: EOMI, oral membranes moist Neck: supple Cardiovascular: RRR without murmur. No JVD    Respiratory: CTA Bilaterally without wheezes or rales. Normal effort    GI: BS +, non-tender, non-distended  Musculoskeletal: Left foot tender with minimal palpation Right shoulder tenderness, improving Neurological:  Alert and oriented Motor: LUE/RLE 4+/5.  RUE limited by pain proximally >/4-/5, 4/5 distally, stable Left lower extremity: Hip flexion, knee extension, 4/5, ankle dorsiflexion 3-/5 (pain inhibition), unchanged Skin:Left foot with the dressing C/D/I. Proximal right upper extremity with hematoma, stable to improved Psychiatric: pleasant   Assessment/Plan: 1. Functional deficits secondary to multi-Ortho which require 3+ hours per day of interdisciplinary therapy in a comprehensive inpatient rehab setting.  Physiatrist is providing close team supervision and 24 hour management of active medical problems listed below.  Physiatrist and rehab team continue to  assess barriers to discharge/monitor patient progress toward functional and medical goals  Care Tool:  Bathing    Body parts bathed by patient: Right arm, Left arm, Chest, Front perineal area, Abdomen, Face, Left lower leg, Right lower leg, Left upper leg, Buttocks, Right upper leg         Bathing assist Assist Level: Supervision/Verbal cueing     Upper Body Dressing/Undressing Upper body dressing   What is the patient wearing?: Pull over shirt    Upper body assist Assist Level: Set up assist    Lower Body Dressing/Undressing Lower body dressing      What is the patient wearing?: Pants     Lower body assist Assist for lower body dressing: Contact Guard/Touching assist     Toileting Toileting    Toileting assist Assist for toileting: Supervision/Verbal cueing     Transfers Chair/bed transfer  Transfers assist     Chair/bed transfer assist level: Supervision/Verbal cueing     Locomotion Ambulation   Ambulation assist   Ambulation activity did not occur: Safety/medical concerns  Assist level: Supervision/Verbal cueing Assistive device: Walker-rolling Max distance: 45 ft   Walk 10 feet activity   Assist  Walk 10 feet activity did not occur: Safety/medical concerns  Assist level: Supervision/Verbal cueing Assistive device: Walker-rolling   Walk 50 feet activity   Assist Walk 50 feet with 2 turns activity did not occur: Safety/medical concerns  Assist level: Contact Guard/Touching assist Assistive device: Walker-rolling    Walk 150 feet activity   Assist Walk 150 feet activity did not occur: Safety/medical concerns  Assist level: Contact Guard/Touching assist Assistive device: Walker-rolling  Walk 10 feet on uneven surface  activity   Assist Walk 10 feet on uneven surfaces activity did not occur: Safety/medical concerns         Wheelchair     Assist Will patient use wheelchair at discharge?: Yes Type of Wheelchair:  Manual    Wheelchair assist level: Independent Max wheelchair distance: >150 ft     Wheelchair 50 feet with 2 turns activity    Assist        Assist Level: Independent   Wheelchair 150 feet activity     Assist Wheelchair 150 feet activity did not occur: Safety/medical concerns   Assist Level: Independent      Medical Problem List and Plan: 1.Functional deficitssecondary to left foot multi-Ortho, right shoulder contusion  Continue CIR 2. DVT Prophylaxis/Anticoagulation: Pharmaceutical:Lovenox 3. Pain Management: continue Neurontin to 400mg  qid     -added nortriptyline 25mg  qhs 10/26  -oxycodone 5mg  q6 prn (recently reduced)  Needs consistent motivation and coping strategies  Lidoderm patch ordered on 10/24, with some improvement improvement  -reviewed coping strategies for pain with patient 4. Mood:LCSW to follow for evaluation and support. Mood appears stable. D/ced valium and added klonopin prn for anxiety.     -anxiety playing a role in pain 5. Neuropsych: This patientiscapable of making decisions onherown behalf. 6. Skin/Wound Care:Monitor wound for healing. Maintain adequate nutritional and hydration status. 7. Fluids/Electrolytes/Nutrition:Monitor I/O.   BMP within acceptable range on 10/25 8. Right shoulder contusion: Local measures with ice and Voltaren gel qid.  9. Leucocytosis: Resolved 10. ABLA: Resolved  Hemoglobin 12.8 on 10/22  Continue to monitor 11. Morbid obesity: Educate on importance of weight loss to help promote mobility and overall health. Consulted dietician for education. 12. Transaminitis: Resolved  LFTs within normal limits on 10/25  Continue to monitor 13.  Hypoalbuminemia  Supplement initiated on 10/19  LOS: 9 days A FACE TO FACE EVALUATION WAS PERFORMED  Ranelle Oyster 11/03/2017, 10:35 AM

## 2017-11-03 NOTE — Progress Notes (Signed)
Occupational Therapy Session Note  Patient Details  Name: [name] MRN: [id number] Date of Birth: [birth year]  Today's Date: 11/03/2017 OT Individual Time: 4098-1191 OT Individual Time Calculation (min): 56 min   Skilled Therapeutic Interventions/Progress Updates:    Pt greeted in w/c, requesting to shower. Tx focus on balance, endurance, and adaptive bathing/dressing skills. Lt foot covered, and then pt ambulated with RW and supervision to TTB. Pt bathed while seated, utilizing lateral leans for perihygiene. Lt foot propped up for comfort. Dressing completed sit<stand level from TTB after with supervision for dynamic standing balance. She ambulated back to w/c in manner as written above, keeping L LE off of floor for NWB. For remainder of session, pt participated in IADL bedmaking task w/c level to work on w/c navigation in tight spaces and R UE AROM. Pt doffed/donned pillowcases, and assisted therapist with applying fitted sheet, sheets, and comforters. At end of session pt was left with all needs within reach and boyfriend present.  Therapy Documentation Precautions:  Precautions Precautions: Fall Required Braces or Orthoses: Other Brace/Splint Other Brace/Splint: Ace wrap around L foot, not to get wet Restrictions Weight Bearing Restrictions: Yes RUE Weight Bearing: Weight bearing as tolerated LLE Weight Bearing: Non weight bearing Pain: In Lt foot. RN in during session to provide pain medicine. Pt able to direct care when handling L LE to minimize pain throughout session  Pain Assessment Pain Scale: 0-10 Pain Score: 10-Worst pain ever Pain Type: Surgical pain Pain Location: Foot Pain Orientation: Left Pain Descriptors / Indicators: Aching Pain Frequency: Constant Pain Onset: On-going Pain Intervention(s): Medication (See eMAR) ADL: ADL Eating: Minimal assistance Where Assessed-Eating: Edge of bed Grooming: Contact guard Where Assessed-Grooming: Standing at  sink Upper Body Bathing: Supervision/safety Where Assessed-Upper Body Bathing: Shower Lower Body Bathing: Supervision/safety Where Assessed-Lower Body Bathing: Shower Upper Body Dressing: Setup Where Assessed-Upper Body Dressing: Wheelchair Lower Body Dressing: Contact guard Where Assessed-Lower Body Dressing: Wheelchair Toileting: Contact guard Where Assessed-Toileting: Teacher, adult education: Furniture conservator/restorer Method: Proofreader: Engineer, technical sales: International aid/development worker Method: Engineer, technical sales: Information systems manager without back Film/video editor: Administrator, arts Method: Designer, industrial/product: Emergency planning/management officer   Therapy/Group: Individual Therapy  Bettye Sitton A Jermarion Poffenberger 11/03/2017, 3:47 PM

## 2017-11-03 NOTE — Plan of Care (Signed)
  Problem: RH PAIN MANAGEMENT Goal: RH STG PAIN MANAGED AT OR BELOW PT'S PAIN GOAL Description Less than 4  Outcome: Not Progressing; c/o of constant pain felt foot

## 2017-11-04 ENCOUNTER — Inpatient Hospital Stay (HOSPITAL_COMMUNITY): Payer: Self-pay | Admitting: Physical Therapy

## 2017-11-04 ENCOUNTER — Inpatient Hospital Stay (HOSPITAL_COMMUNITY): Payer: Self-pay | Admitting: Occupational Therapy

## 2017-11-04 DIAGNOSIS — M79672 Pain in left foot: Secondary | ICD-10-CM

## 2017-11-04 DIAGNOSIS — Z720 Tobacco use: Secondary | ICD-10-CM

## 2017-11-04 MED ORDER — TRAMADOL HCL 50 MG PO TABS
50.0000 mg | ORAL_TABLET | Freq: Three times a day (TID) | ORAL | Status: DC | PRN
  Administered 2017-11-04 – 2017-11-05 (×3): 50 mg via ORAL
  Filled 2017-11-04 (×3): qty 1

## 2017-11-04 MED ORDER — OXYCODONE HCL 5 MG PO TABS
5.0000 mg | ORAL_TABLET | Freq: Three times a day (TID) | ORAL | Status: DC | PRN
  Administered 2017-11-04 – 2017-11-05 (×2): 5 mg via ORAL
  Filled 2017-11-04 (×2): qty 1

## 2017-11-04 NOTE — Progress Notes (Signed)
Physical Therapy Session Note  Patient Details  Name: [name] MRN: [id number] Date of Birth: [birth year]  Today's Date: 11/04/2017 PT Individual Time: 6213-0865 PT Individual Time Calculation (min): 54 min   Short Term Goals: Week 2:  PT Short Term Goal 1 (Week 2): = LTGs due to LOS  Skilled Therapeutic Interventions/Progress Updates:  Pt received in bed & agreeable to tx. Pt reports 7/10 pain in LLE & RN made aware & administered meds & pt applied ice to LLE throughout session. Pt transferred supine<>sitting EOB with mod I, bed<>w/c via squat pivot mod I, and managed w/c parts with mod I. Pt propels w/c throughout unit with mod I & in gym negotiates 5 steps (6") with B rails and close supervision with hopping method. Pt with behaviors indicating increased LLE pain when LLE is in dependent position. Pt requests to propel w/c outside & propelled to [employer] over uneven surfaces with mod I, and ramps (significant ramp from [employer]) with supervision and rest breaks PRN. Pt notes only fatigue after negotiating long ramp. Discussed pt's fear of riding in a car again and therapist provided encouragement and recommendations of having a reliable driver and using music therapy to calm herself. At end of session pt reports need to use restroom and ambulates to bathroom with visitor present - pt already made mod I in room.   Therapy Documentation Precautions:  Precautions Precautions: Fall Required Braces or Orthoses: Other Brace/Splint Other Brace/Splint: Ace wrap around L foot, not to get wet Restrictions Weight Bearing Restrictions: Yes RUE Weight Bearing: Weight bearing as tolerated LUE Weight Bearing: Weight bearing as tolerated LLE Weight Bearing: Non weight bearing    Therapy/Group: Individual Therapy  Sandi Mariscal 11/04/2017, 3:35 PM

## 2017-11-04 NOTE — Progress Notes (Signed)
Social Work  Discharge Note  The overall goal for the admission was met for:   Discharge location: Yes-HOME TO [name]  Length of Stay: Yes-11 DAYS  Discharge activity level: Yes-MOD/I WHEELCHAIR LEVEL  Home/community participation: Yes  Services provided included: MD, RD, PT, OT, RN, CM, Pharmacy, Neuropsych and SW  Financial Services: Other: PENDING MEDICAID  Follow-up services arranged: Home Health: [facility], DME: [place] and Patient/Family has no preference for HH/DME agencies  Comments (or additional information):PT REACHED MOD/I WHEELCHAIR LEVEL DUE TO NWB. MOM CAN BE THERE ALONG WITH HER BOYFRIEND AND FRIENDS. MATCH GIVEN TOP PT FOR MEDICATION ASSISTANCE  Patient/Family verbalized understanding of follow-up arrangements: Yes  Individual responsible for coordination of the follow-up plan: SELF & [name]  Confirmed correct DME delivered: Elease Hashimoto 11/04/2017    Elease Hashimoto

## 2017-11-04 NOTE — Progress Notes (Signed)
Social Work Patient ID: [name], female   DOB: 09/13/1985, 32 y.o.   MRN: [id number]  Met with pt to discuss discharge tomorrow, she is trying to get her Medicaid re-instated via Medicaid social worker. Her equipment is being delivered to her room and home health was arranged prior to coming to rehab via [facility]. She feels ready to go home tomorrow.

## 2017-11-04 NOTE — Progress Notes (Signed)
[name] PHYSICAL MEDICINE & REHABILITATION PROGRESS NOTE  Subjective/Complaints: Patient seen laying in bed this morning.  She states she slept well overnight.  She states she had a good weekend.  Informed by nursing regarding patient leaving to smoke cigarettes.  Educated patient.  ROS: Denies CP, SOB, N/V/D  Objective: Vital Signs: Blood pressure (!) 145/74, pulse 89, temperature 97.8 F (36.6 C), resp. rate 18, weight 122.4 kg, SpO2 95 %. No results found. No results for input(s): WBC, HGB, HCT, PLT in the last 72 hours. No results for input(s): NA, K, CL, CO2, GLUCOSE, BUN, CREATININE, CALCIUM in the last 72 hours.  Physical Exam: BP (!) 145/74 (BP Location: Right Arm)   Pulse 89   Temp 97.8 F (36.6 C)   Resp 18   Wt 122.4 kg   SpO2 95%   BMI 42.26 kg/m  Constitutional: No distress . Vital signs reviewed. HENT: Normocephalic.  Atraumatic. Eyes: EOMI. No discharge. Cardiovascular: RRR. No JVD. Respiratory: CTA Bilaterally. Normal effort. GI: BS +. Non-distended. Musculoskeletal: Left foot tender with minimal palpation, stable Right shoulder tenderness, improving Neurological:  Alert and oriented Motor: LUE/RLE 4+/5.  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 Functional deficits secondary to multi-Ortho which require 3+ hours per day of interdisciplinary therapy in a comprehensive inpatient rehab setting.  Physiatrist is providing close team supervision and 24 hour management of active medical problems listed below.  Physiatrist and rehab team continue to assess barriers to discharge/monitor patient progress toward functional and medical goals  Care Tool:  Bathing     Body parts bathed by patient: Right arm, Left arm, Chest, Front perineal area, Abdomen, Face, Left lower leg, Right lower leg, Left upper leg, Buttocks, Right upper leg         Bathing assist Assist Level: Set up assist     Upper Body Dressing/Undressing Upper body dressing   What is the patient wearing?: Pull over shirt    Upper body assist Assist Level: Set up assist    Lower Body Dressing/Undressing Lower body dressing      What is the patient wearing?: Pants     Lower body assist Assist for lower body dressing: Supervision/Verbal cueing     Toileting Toileting    Toileting assist Assist for toileting: Supervision/Verbal cueing     Transfers Chair/bed transfer  Transfers assist     Chair/bed transfer assist level: Supervision/Verbal cueing     Locomotion Ambulation   Ambulation assist   Ambulation activity did not occur: Safety/medical concerns  Assist level: Supervision/Verbal cueing Assistive device: Walker-rolling Max distance: 45 ft   Walk 10 feet activity   Assist  Walk 10 feet activity did not occur: Safety/medical concerns  Assist level: Supervision/Verbal cueing Assistive device: Walker-rolling   Walk 50 feet activity   Assist Walk 50 feet with 2 turns activity did not occur: Safety/medical concerns  Assist level: Contact Guard/Touching assist Assistive device: Walker-rolling    Walk 150 feet activity   Assist Walk 150 feet activity did not occur: Safety/medical concerns  Assist level: Contact Guard/Touching assist Assistive device: Walker-rolling    Walk 10 feet on uneven surface  activity   Assist Walk 10 feet on uneven surfaces activity did not occur: Safety/medical concerns  Wheelchair     Assist Will patient use wheelchair at discharge?: Yes Type of Wheelchair: Manual    Wheelchair assist level: Independent Max wheelchair distance: >150 ft     Wheelchair 50 feet with 2 turns  activity    Assist        Assist Level: Independent   Wheelchair 150 feet activity     Assist Wheelchair 150 feet activity did not occur: Safety/medical concerns   Assist Level: Independent      Medical Problem List and Plan: 1.Functional deficitssecondary to left foot multi-Ortho, right shoulder contusion  Continue CIR  Plan for d/c tomorrow  Will see patient for transitional care management in 1-2 weeks post-discharge 2. DVT Prophylaxis/Anticoagulation: Pharmaceutical:Lovenox 3. Pain Management: continue Neurontin to 400mg  qid     Added nortriptyline 25mg  qhs 10/26  Oxycodone 5mg  q6 prn, decreased to every 8 as needed on 10/26  Needs consistent motivation and coping strategies  Lidoderm patch ordered on 10/24, with improvement 4. Mood:LCSW to follow for evaluation and support. Mood appears stable. D/ced valium and added klonopin prn for anxiety. 5. Neuropsych: This patientiscapable of making decisions onherown behalf. 6. Skin/Wound Care:Monitor wound for healing. Maintain adequate nutritional and hydration status. 7. Fluids/Electrolytes/Nutrition:Monitor I/O.   BMP within acceptable range on 10/25 8. Right shoulder contusion: Local measures with ice and Voltaren gel qid.  9. Leucocytosis: Resolved 10. ABLA: Resolved  Hemoglobin 12.8 on 10/22  Continue to monitor 11. Morbid obesity: Educate on importance of weight loss to help promote mobility and overall health. Consulted dietician for education. 12. Transaminitis: Resolved  LFTs within normal limits on 10/25  Continue to monitor 13.  Hypoalbuminemia  Supplement initiated on 10/19 14.  Tobacco abuse  Educated on policy regarding smoking while in the hospital  Educated on effects of nicotine and wound healing.  LOS: 10 days A FACE TO FACE EVALUATION WAS PERFORMED  Shahil Speegle Karis Juba 11/04/2017, 9:15 AM

## 2017-11-04 NOTE — Progress Notes (Signed)
Occupational Therapy Session Note  Patient Details  Name: Sheniece Ruggles MRN: [id number] Date of Birth: 01/02/1986  Today's Date: 11/04/2017 OT Individual Time: 1045-1200 OT Individual Time Calculation (min): 75 min    Short Term Goals: Week 1:  OT Short Term Goal 1 (Week 1): Pt will don pants with CGA OT Short Term Goal 2 (Week 1): Pt will transfer to toilet with CGA OT Short Term Goal 3 (Week 1): Pt will report pain no greater than 4/10 in LLE during ADL transfers OT Short Term Goal 4 (Week 1): Pt will use R UE to feed self 100% of meal with set up only  Skilled Therapeutic Interventions/Progress Updates:    Pt sitting in w/c upon entry with boyfriend present and reports of 7/10 pain however agreeable to therapy session. Pt performed all aspects of functional ambulation, w/c mobility/mgmt, and functional transfers with RW and good adherence to NWB of LLE with mod I this session. Pt transferred to TTB in shower and performed bathing tasks while seated with improvements in ability to tolerate LLE in dependent position throughout (prior pt would prop foot on trash can for elevation). Pt then ambulated to w/c and performed all aspects of dressing at mod I level in w/c demonstrating good w/c navigation in small room as well as w/c positioning for retrieving clothing and dressing tasks.   Pt performed w/c mobility with focus on BUE strength to ADL apartment and performed functional transfers to/from TTB in bathroom and low seated couch with mod I. Pt then ambulated around room with RW in order for pt to ambulate on carpeted surface in simulation of home environment- pt with reports of having to "pick up R foot more since it doesn't slide" however still able to complete with mod I.  Pt then educated on ROM exercises for home with broom or ball for shoulder flexion and internal//external rotation. 10 reps of shoulder flexion with ball completed. Pt then returned to room as mentioned above and  left seated in w/c with boyfriend present. Pt at mod I level, cleared by OT/PT for mod I in room. Certificate placed on door nursing notified.   Therapy Documentation Precautions:  Precautions Precautions: Fall Required Braces or Orthoses: Other Brace/Splint Other Brace/Splint: Ace wrap around L foot, not to get wet Restrictions Weight Bearing Restrictions: Yes RUE Weight Bearing: Weight bearing as tolerated LLE Weight Bearing: Non weight bearing ADL: ADL Grooming: Modified independent Where Assessed-Grooming: Wheelchair Upper Body Bathing: Modified independent Where Assessed-Upper Body Bathing: Shower Lower Body Bathing: Modified independent Where Assessed-Lower Body Bathing: Shower Upper Body Dressing: Modified independent (Device) Where Assessed-Upper Body Dressing: Wheelchair Lower Body Dressing: Modified independent Where Assessed-Lower Body Dressing: Wheelchair Toileting: Modified independent Where Assessed-Toileting: Teacher, adult education: Engineer, agricultural Method: Proofreader: Chiropractor Transfer: Modified independent Tub/Shower Transfer Method: Ship broker: Emergency planning/management officer   Therapy/Group: Individual Therapy  Dezmen Alcock 11/04/2017, 11:00 AM

## 2017-11-04 NOTE — Progress Notes (Signed)
Occupational Therapy Discharge Summary  Patient Details  Name: [name] MRN: [id number] Date of Birth: 1985/06/06  Today's Date: 11/04/2017    Patient has met 11 of 11 long term goals due to improved activity tolerance, improved balance and ability to compensate for deficits.  Patient to discharge at overall Modified Independent level.  Patient's reports care partner is independent to provide the necessary physical assistance at discharge. Pt has made great progress during length of stay in CIR. Pt is now mod I for functional tranfers, w/c mgmt, self care tasks and simple meal prep in kitchen from w/c level. Pt has also shown improvements in ability to tolerate LLE in dependent position during ambulation, transfers and self care tasks however depending on pain levels pt req foot to be elevated and on stable surface for pain mgmt. Pt has performed tub/shower transfers with TTB with mod I and does not have difficulties transferring BLE's over tub ledge- pt then performs lateral leans for peri hygiene. Formal family education not completed- pt's boyfriend has been present however denied accompanying pt throughout therapies in room, apartment and therapy gym.    Recommendation:  Patient will benefit from ongoing skilled OT services in home health setting to continue to advance functional skills in the area of BADL and iADL.  Equipment: TTB and drop arm BSC  Reasons for discharge: treatment goals met and discharge from hospital  Patient/family agrees with progress made and goals achieved: Yes  OT Discharge Precautions/Restrictions  Precautions Other Brace/Splint: Ace wrap around L foot, not to get wet Restrictions Weight Bearing Restrictions: Yes RUE Weight Bearing: Weight bearing as tolerated LLE Weight Bearing: NWB  ADL ADL Grooming: Modified independent Where Assessed-Grooming: Wheelchair Upper Body Bathing: Modified independent Where Assessed-Upper Body Bathing:  Shower Lower Body Bathing: Modified independent Where Assessed-Lower Body Bathing: Shower Upper Body Dressing: Modified independent (Device) Where Assessed-Upper Body Dressing: Wheelchair Lower Body Dressing: Modified independent Where Assessed-Lower Body Dressing: Wheelchair Toileting: Modified independent Where Assessed-Toileting: Glass blower/designer: Diplomatic Services operational officer Method: Counselling psychologist: Energy manager: Modified independent Clinical cytogeneticist Method: Optometrist: Gaffer Baseline Vision/History: No visual deficits Patient Visual Report: No change from baseline Vision Assessment?: No apparent visual deficits Perception  Perception: Within Functional Limits Praxis Praxis: Intact Cognition Overall Cognitive Status: Within Functional Limits for tasks assessed Arousal/Alertness: Awake/alert Orientation Level: Oriented X4;Oriented to person;Oriented to place;Oriented to time;Oriented to situation Memory: Appears intact Awareness: Appears intact Problem Solving: Appears intact Safety/Judgment: Appears intact Sensation Sensation Light Touch: Appears Intact Coordination Gross Motor Movements are Fluid and Coordinated: No Fine Motor Movements are Fluid and Coordinated: Yes Coordination and Movement Description: Impaired 2/2 LLE NWB however improvements noted over length of stay  Motor  Motor Motor: Within Functional Limits Mobility  Transfers Sit to Stand: Independent with assistive device Stand to Sit: Independent with assistive device  Trunk/Postural Assessment  Cervical Assessment Cervical Assessment: Within Functional Limits Thoracic Assessment Thoracic Assessment: Within Functional Limits Lumbar Assessment Lumbar Assessment: Within Functional Limits Postural Control Postural Control: Within Functional Limits  Balance Balance Balance Assessed: Yes Static Sitting  Balance Static Sitting - Balance Support: Feet supported Static Sitting - Level of Assistance: 6: Modified independent (Device/Increase time) Dynamic Sitting Balance Dynamic Sitting - Balance Support: Feet supported Dynamic Sitting - Level of Assistance: 6: Modified independent (Device/Increase time) Dynamic Sitting - Balance Activities: Reaching across midline;Reaching for objects Static Standing Balance Static Standing - Balance Support: During functional activity Static Standing - Level of  Assistance: 6: Modified independent (Device/Increase time) Dynamic Standing Balance Dynamic Standing - Balance Support: During functional activity Dynamic Standing - Level of Assistance: 6: Modified independent (Device/Increase time) Dynamic Standing - Balance Activities: Reaching for objects Extremity/Trunk Assessment RUE Assessment RUE Assessment: Within Functional Limits Active Range of Motion (AROM) Comments: WFL  General Strength Comments: Shoulder and elbow 4/5  LUE Assessment LUE Assessment: Within Functional Limits Active Range of Motion (AROM) Comments: WFL  General Strength Comments: Shoulder and elbow 5/5    Chloe Busbee 11/04/2017, 12:41 PM 

## 2017-11-04 NOTE — Progress Notes (Addendum)
Physical Therapy Discharge Summary  Patient Details  Name: [name] MRN: [id number] Date of Birth: 06-Jan-1986  Today's Date: 11/04/2017 PT Individual Time: [phone]  Total Time: 30 mins Missed Time: 30 mins (nursing care)   Patient has met 11 of 11 long term goals due to improved activity tolerance, improved balance, improved postural control, increased strength, increased range of motion and functional use of  right upper extremity.  Patient to discharge at a wheelchair level Independent.   Patient's care partner intermittently available to provide the necessary physical assistance at discharge.  Reasons goals not met: All goals met.  Recommendation:  Patient will benefit from ongoing skilled PT services in home health setting to continue to advance safe functional mobility, address ongoing impairments in gait and stair training, strength, endurance, pain management, and minimize fall risk.  Equipment: RW, manual w/c from acute setting  Reasons for discharge: treatment goals met and discharge from hospital  Patient/family agrees with progress made and goals achieved: Yes  Skilled PT Interventions Pt received sitting in bed with 10/10 L foot stabbing/throbbing pain as described below. Pt informed PT and SPT that nurse was going to re-wrap the foot and administer pain meds. HEP program program provided and reviewed with pt. ModI toilet transfer and independent with hygiene/clothing mangement.  Functional mobility, locomotion, postural control, and balance were assessed as described below. At end of session, pt remained seated in w/c with ice applied around L foot for pain management, all needs in reach, and no further questions regarding d/c home.   PT Discharge Precautions/Restrictions Precautions Other Brace/Splint: Ace wrap around L foot, not to get wet Restrictions Weight Bearing Restrictions: Yes RUE Weight Bearing: Weight bearing as tolerated LLE Weight Bearing:  Non weight bearing Vital Signs Therapy Vitals Temp: 98.3 F (36.8 C) Pulse Rate: 71 Resp: 20 BP: (!) 109/97 Patient Position (if appropriate): Lying Oxygen Therapy SpO2: 98 % O2 Device: Room Air Pain Pain Assessment Pain Scale: 0-10 Pain Score: 10-Worst pain ever Pain Type: Acute pain;Neuropathic pain Pain Location: Foot Pain Orientation: Left Pain Descriptors / Indicators: Throbbing;Stabbing;Aching;Burning Pain Frequency: Constant Pain Onset: On-going Patients Stated Pain Goal: 5 Pain Intervention(s): Medication (See eMAR);Repositioned;Cold applied;Elevated extremity Vision/Perception  Perception Perception: Within Functional Limits Praxis Praxis: Intact  Cognition Overall Cognitive Status: Within Functional Limits for tasks assessed Arousal/Alertness: Awake/alert Orientation Level: Oriented X4;Oriented to person;Oriented to place;Oriented to time;Oriented to situation Memory: Appears intact Awareness: Appears intact Problem Solving: Appears intact Safety/Judgment: Appears intact Sensation Sensation Light Touch: Appears Intact Coordination Gross Motor Movements are Fluid and Coordinated: No Fine Motor Movements are Fluid and Coordinated: Yes Coordination and Movement Description: Impaired due to LLE NWB status but improvements noted over LOS Motor  Motor Motor: Within Functional Limits  Mobility Transfers Transfers: Sit to Stand Sit to Stand: Independent with assistive device Stand to Sit: Independent with assistive device Stand Pivot Transfers: Independent with assistive device Transfer (Assistive device): Rolling walker Locomotion  Gait Ambulation: Yes Gait Assistance: Independent with assistive device Gait Distance (Feet): 60 Feet Assistive device: Rolling walker Gait Gait: Yes Gait Pattern: Step-to pattern;Antalgic(NWB LLE) Gait velocity: slow Stairs / Additional Locomotion Stairs: Yes Stairs Assistance: Supervision/Verbal cueing Stair  Management Technique: Two rails;Other (comment)(hopping) Number of Stairs: 8 Height of Stairs: 6 Wheelchair Mobility Wheelchair Mobility: Yes Wheelchair Assistance: Independent with Camera operator: Both upper extremities Wheelchair Parts Management: Supervision/cueing Distance: 400  Trunk/Postural Assessment  Cervical Assessment Cervical Assessment: Within Functional Limits Thoracic Assessment Thoracic Assessment: Within Functional Limits Lumbar Assessment Lumbar  Assessment: Within Functional Limits Postural Control Postural Control: Within Functional Limits  Balance Balance Balance Assessed: Yes Static Sitting Balance Static Sitting - Balance Support: Feet supported Static Sitting - Level of Assistance: 7: Independent Dynamic Sitting Balance Dynamic Sitting - Balance Support: Feet supported Dynamic Sitting - Level of Assistance: 7: Independent Dynamic Sitting - Balance Activities: Reaching across midline;Reaching for objects;Forward lean/weight shifting;Lateral lean/weight shifting Static Standing Balance Static Standing - Balance Support: During functional activity Static Standing - Level of Assistance: 7: Independent Dynamic Standing Balance Dynamic Standing - Balance Support: During functional activity Dynamic Standing - Level of Assistance: 6: Modified independent (Device/Increase time) Dynamic Standing - Balance Activities: Reaching for objects Extremity Assessment  RUE Assessment RUE Assessment: Within Functional Limits Active Range of Motion (AROM) Comments: WFL  General Strength Comments: Shoulder and elbow 4/5  LUE Assessment LUE Assessment: Within Functional Limits Active Range of Motion (AROM) Comments: WFL  General Strength Comments: Shoulder and elbow 5/5  RLE Assessment RLE Assessment: Within Functional Limits LLE Assessment LLE Assessment: Exceptions to [facility] Passive Range of Motion (PROM) Comments: Difficult to assess ankle ROM  due to guarding/pain w/ LLE extremity movements, General Strength Comments: hip/knee WFL; Difficult to assess ankle strength due to guarding/pain    [place] Aadarsh Cozort 11/04/2017, 3:06 PM

## 2017-11-05 DIAGNOSIS — E876 Hypokalemia: Secondary | ICD-10-CM

## 2017-11-05 LAB — BASIC METABOLIC PANEL
ANION GAP: 8 (ref 5–15)
BUN: 13 mg/dL (ref 6–20)
CALCIUM: 9.2 mg/dL (ref 8.9–10.3)
CO2: 26 mmol/L (ref 22–32)
Chloride: 106 mmol/L (ref 98–111)
Creatinine, Ser: 0.66 mg/dL (ref 0.44–1.00)
GLUCOSE: 110 mg/dL — AB (ref 70–99)
POTASSIUM: 3.4 mmol/L — AB (ref 3.5–5.1)
SODIUM: 140 mmol/L (ref 135–145)

## 2017-11-05 LAB — CBC
HCT: 36.5 % (ref 36.0–46.0)
Hemoglobin: 11.9 g/dL — ABNORMAL LOW (ref 12.0–15.0)
MCH: 31.6 pg (ref 26.0–34.0)
MCHC: 32.6 g/dL (ref 30.0–36.0)
MCV: 96.8 fL (ref 80.0–100.0)
NRBC: 0 % (ref 0.0–0.2)
PLATELETS: 316 10*3/uL (ref 150–400)
RBC: 3.77 MIL/uL — AB (ref 3.87–5.11)
RDW: 12.2 % (ref 11.5–15.5)
WBC: 8.6 10*3/uL (ref 4.0–10.5)

## 2017-11-05 MED ORDER — ACETAMINOPHEN 325 MG PO TABS: 325.0000 mg | ORAL_TABLET | ORAL | Status: AC | PRN

## 2017-11-05 MED ORDER — DOCUSATE SODIUM 100 MG PO CAPS: 100.0000 mg | ORAL_CAPSULE | Freq: Two times a day (BID) | ORAL | 0 refills | Status: AC

## 2017-11-05 MED ORDER — DICLOFENAC SODIUM 1 % TD GEL: 4.0000 g | Freq: Four times a day (QID) | TRANSDERMAL | 0 refills | Status: AC

## 2017-11-05 MED ORDER — POTASSIUM CHLORIDE CRYS ER 20 MEQ PO TBCR
40.0000 meq | EXTENDED_RELEASE_TABLET | Freq: Once | ORAL | Status: AC
  Administered 2017-11-05: 40 meq via ORAL
  Filled 2017-11-05: qty 2

## 2017-11-05 MED ORDER — METHOCARBAMOL 500 MG PO TABS: 500.0000 mg | ORAL_TABLET | Freq: Four times a day (QID) | ORAL | 0 refills | Status: DC | PRN

## 2017-11-05 MED ORDER — GABAPENTIN 400 MG PO CAPS: 400.0000 mg | ORAL_CAPSULE | Freq: Three times a day (TID) | ORAL | 0 refills | Status: DC

## 2017-11-05 MED ORDER — NICOTINE 7 MG/24HR TD PT24: 7.0000 mg | MEDICATED_PATCH | Freq: Every day | TRANSDERMAL | 0 refills | Status: AC

## 2017-11-05 MED ORDER — NORTRIPTYLINE HCL 25 MG PO CAPS: 25.0000 mg | ORAL_CAPSULE | Freq: Every day | ORAL | 0 refills | Status: DC

## 2017-11-05 MED ORDER — TRAMADOL HCL 50 MG PO TABS: 50.0000 mg | ORAL_TABLET | Freq: Two times a day (BID) | ORAL | 0 refills | Status: DC | PRN

## 2017-11-05 MED ORDER — OXYCODONE HCL 5 MG PO TABS: 5.0000 mg | ORAL_TABLET | Freq: Two times a day (BID) | ORAL | 0 refills | Status: DC | PRN

## 2017-11-05 MED ORDER — CLONAZEPAM 0.5 MG PO TABS: 0.2500 mg | ORAL_TABLET | Freq: Two times a day (BID) | ORAL | 0 refills | Status: AC | PRN

## 2017-11-05 MED ORDER — ASPIRIN EC 325 MG PO TBEC: 325.0000 mg | DELAYED_RELEASE_TABLET | Freq: Every day | ORAL | 3 refills | Status: AC

## 2017-11-05 NOTE — Progress Notes (Signed)
Recreational Therapy Discharge Summary Patient Details  Name: Brissia Delisa MRN: [id number] Date of Birth: Jun 01, 1985 Today's Date: 11/05/2017   Comments on progress toward goals: Eval completed 10/23 and pt's discharge date has been moved up due to her progress.  NO formal TR session has occurred since eval.  Discussion on eval included education about activity analysis with safety concernss & potential modifications, energy conservation & stress management/relaxation training.  Pt stated understanding at that time.  Pt is discharging home at Mod I w/c level.  Reasons for discharge: discharge from hospital   Patient/family agrees with progress made and goals achieved: Yes  Zymeir Salminen 11/05/2017, 1:11 PM

## 2017-11-05 NOTE — Progress Notes (Signed)
Please see discharge summary.  Potassium 3.4, supplemented x1 dose prior to discharge.

## 2017-11-05 NOTE — Discharge Instructions (Signed)
Inpatient Rehab Discharge Instructions  [name] Discharge date and time:  11/05/17  Activities/Precautions/ Functional Status: Activity: no lifting, driving, or strenuous exercise for till cleared by MD Diet: regular diet Wound Care:  Wash with soap and water. Pat dry and apply dry dressing--change twice a day to prevent it from sticking.     Functional status:  ___ No restrictions     ___ Walk up steps independently ___ 24/7 supervision/assistance   ___ Walk up steps with assistance _X__ Intermittent supervision/assistance  ___ Bathe/dress independently ___ Walk with walker     ___ Bathe/dress with assistance ___ Walk Independently    ___ Shower independently ___ Walk with assistance    __X_ Shower with assistance _X__ No alcohol/smoking    ___ Return to work/school ________   Special Instructions: 1. No weight on left foot.     COMMUNITY REFERRALS UPON DISCHARGE:    Home Health:   PT, OT, RN  Agency:[facility] [phone]   Date of last service:11/06/2017  Medical Equipment/Items Ordered:WHEELCHAIR, DROP-ARM BEDSIDE COMMODE, TUB BENCH & Levan Hurst  Agency/Supplier:[facility]   [phone]  Other: TALKING WITH MEDICAID REGARDING HAVING IT ACTIVE AGAIN MATCH FOR MEDICATION ASSISTANCE GIVEN TO PATIENT   My questions have been answered and I understand these instructions. I will adhere to these goals and the provided educational materials after my discharge from the hospital.  Patient/Caregiver Signature _______________________________ Date __________  Clinician Signature _______________________________________ Date __________  Please bring this form and your medication list with you to all your follow-up doctor's appointments.
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
## 2017-11-07 ENCOUNTER — Telehealth: Payer: Self-pay

## 2017-11-07 NOTE — Telephone Encounter (Signed)
Transitional Care call-patient    1. Are you/is patient experiencing any problems since coming home? No Are there any questions regarding any aspect of care? No 2. Are there any questions regarding medications administration/dosing? No Are meds being taken as prescribed?Yes Patient should review meds with caller to confirm 3. Have there been any falls? No 4. Has Home Health been to the house and/or have they contacted you? Yes If not, have you tried to contact them? Can we help you contact them? 5. Are bowels and bladder emptying properly? Yes Are there any unexpected incontinence issues? No If applicable, is patient following bowel/bladder programs? 6. Any fevers, problems with breathing, unexpected pain? No 7. Are there any skin problems or new areas of breakdown? No 8. Has the patient/family member arranged specialty MD follow up (ie cardiology/neurology/renal/surgical/etc)? Yes Can we help arrange? 9. Does the patient need any other services or support that we can help arrange? No 10. Are caregivers following through as expected in assisting the patient? Yes 11. Has the patient quit smoking, drinking alcohol, or using drugs as recommended? Yes  Appointment time, arrive time 11:20 for 11:40 appt with Dr. Allena Katz [phone]

## 2017-11-08 DIAGNOSIS — S99922A Unspecified injury of left foot, initial encounter: Secondary | ICD-10-CM | POA: Diagnosis not present

## 2017-11-13 DIAGNOSIS — S91112D Laceration without foreign body of left great toe without damage to nail, subsequent encounter: Secondary | ICD-10-CM | POA: Diagnosis not present

## 2017-11-14 ENCOUNTER — Encounter: Payer: Medicaid Other | Attending: Physical Medicine & Rehabilitation | Admitting: Physical Medicine & Rehabilitation

## 2017-11-14 ENCOUNTER — Encounter: Payer: Self-pay | Admitting: Physical Medicine & Rehabilitation

## 2017-11-14 VITALS — BP 125/81 | HR 86 | Ht 66.0 in | Wt 260.0 lb

## 2017-11-14 DIAGNOSIS — M79672 Pain in left foot: Secondary | ICD-10-CM

## 2017-11-14 DIAGNOSIS — G8918 Other acute postprocedural pain: Secondary | ICD-10-CM

## 2017-11-14 DIAGNOSIS — Z87891 Personal history of nicotine dependence: Secondary | ICD-10-CM | POA: Diagnosis not present

## 2017-11-14 DIAGNOSIS — X58XXXD Exposure to other specified factors, subsequent encounter: Secondary | ICD-10-CM | POA: Diagnosis not present

## 2017-11-14 DIAGNOSIS — R200 Anesthesia of skin: Secondary | ICD-10-CM | POA: Insufficient documentation

## 2017-11-14 DIAGNOSIS — S9032XD Contusion of left foot, subsequent encounter: Secondary | ICD-10-CM | POA: Diagnosis not present

## 2017-11-14 DIAGNOSIS — G4733 Obstructive sleep apnea (adult) (pediatric): Secondary | ICD-10-CM | POA: Diagnosis not present

## 2017-11-14 DIAGNOSIS — S40011D Contusion of right shoulder, subsequent encounter: Secondary | ICD-10-CM | POA: Insufficient documentation

## 2017-11-14 DIAGNOSIS — Z6841 Body Mass Index (BMI) 40.0 and over, adult: Secondary | ICD-10-CM | POA: Insufficient documentation

## 2017-11-14 DIAGNOSIS — R269 Unspecified abnormalities of gait and mobility: Secondary | ICD-10-CM | POA: Diagnosis not present

## 2017-11-14 DIAGNOSIS — Z720 Tobacco use: Secondary | ICD-10-CM

## 2017-11-14 DIAGNOSIS — S40011S Contusion of right shoulder, sequela: Secondary | ICD-10-CM

## 2017-11-14 MED ORDER — GABAPENTIN 600 MG PO TABS: 600.0000 mg | ORAL_TABLET | Freq: Three times a day (TID) | ORAL | 1 refills | Status: DC

## 2017-11-14 NOTE — Progress Notes (Signed)
Subjective:    Patient ID: [name], female    DOB: [birth year], 32 y.o.   MRN: [id number]  HPI 32 year old female with history of OSA-untreated presents for transtional care management after receiving CIR for multi-ortho.  DATE OF ADMISSION: 10/25/2017 DATE OF DISCHARGE: 11/05/2017  At discharge, she was instructed to change dressing, which she states she has been doing.  She saw Ortho yesterday and stiches were removed. She is still NWB. She does not have an appointment with PCP. Pain is improving and taking tylenol, Robaxin, Gabapentin. Right shoulder is improving. She states she has quit smoking. Denies falls.   Therapies: on hold until cleared to weight bear DME: bench chair, bedside commode Mobility: Walker in home, wheelchair in community.   Pain Inventory Average Pain 6 Pain Right Now 4 My pain is sharp, burning, stabbing, tingling and aching  In the last 24 hours, has pain interfered with the following? General activity 4 Relation with others 0 Enjoyment of life 5 What TIME of day is your pain at its worst? night Sleep (in general) Fair  Pain is worse with: walking and standing Pain improves with: rest, heat/ice and medication Relief from Meds: 9  Mobility use a walker ability to climb steps?  yes do you drive?  no  Function not employed: date last employed . I need assistance with the following:  bathing, meal prep and household duties  Neuro/Psych numbness tingling spasms anxiety  Prior Studies Any changes since last visit?  no  Physicians involved in your care Any changes since last visit?  no   Family History  Problem Relation Age of Onset  . Cirrhosis Father    Social History   Socioeconomic History  . Marital status: Single    Spouse name: Not on file  . Number of children: Not on file  . Years of education: Not on file  . Highest education level: Not on file  Occupational History  . Not on file  Social Needs  .  Financial resource strain: Not on file  . Food insecurity:    Worry: Not on file    Inability: Not on file  . Transportation needs:    Medical: Not on file    Non-medical: Not on file  Tobacco Use  . Smoking status: Current Some Day Smoker    Packs/day: 0.25    Types: Cigarettes    Start date: 02/21/2003  . Smokeless tobacco: Never Used  Substance and Sexual Activity  . Alcohol use: No  . Drug use: No  . Sexual activity: Not Currently  Lifestyle  . Physical activity:    Days per week: Not on file    Minutes per session: Not on file  . Stress: Not on file  Relationships  . Social connections:    Talks on phone: Not on file    Gets together: Not on file    Attends religious service: Not on file    Active member of club or organization: Not on file    Attends meetings of clubs or organizations: Not on file    Relationship status: Not on file  Other Topics Concern  . Not on file  Social History Narrative  . Not on file   Past Surgical History:  Procedure Laterality Date  . CHOLECYSTECTOMY  2009  . DILITATION & CURRETTAGE/HYSTROSCOPY WITH NOVASURE ABLATION N/A 06/07/2015   Procedure: DILATATION & CURETTAGE/HYSTEROSCOPY WITH NOVASURE ABLATION;  Surgeon: [name], MD;  Location: [facility];  Service: Gynecology;  Laterality:  N/A;  Uterine cavity length: 5.5 , uterine cavity width: 4.5,    power: 136, time: 1 minute 14 seconds.  . I&D EXTREMITY Left 10/21/2017   Procedure: 1.LEFT 2ND TOE DORSUM FOREIGN BODY, IRRIGATION AND DEBRIDEMENT BONE, EXTENSOR TENDOR LACERATION. 2.SECOND TOE PLANAR 2 CM X 1.75 CM WOUND WITH BONE,TISSUE LOSS. 3. GREAT TOE 0.5 CM LACERATION 4. THIRD TOE OPEN FRACTURE DISTAL PHALANX (DIP) JOINT 5. FOURTH TOE PLANTAR 3 CM WOUND WITH TENDON, BONE EXPOSURE PARTIAL EXCISION DISTAL PHALANX  6. 2 X 2 LACERATION PLANTAR FIRST MTP 7. 10 CM X  4  . LAPAROSCOPIC BILATERAL SALPINGECTOMY Bilateral 06/07/2015   Procedure: LAPAROSCOPIC BILATERAL SALPINGECTOMY;  Surgeon: [name], MD;  Location: [facility];  Service: Gynecology;  Laterality: Bilateral;  . left arm surgery     Past Medical History:  Diagnosis Date  . Asthma   . Sleep apnea    BP 125/81   Pulse 86   Ht 5\' 6"  (1.676 m)   Wt 260 lb (117.9 kg)   LMP 11/08/2017   SpO2 94%   BMI 41.97 kg/m   Opioid Risk Score:   Fall Risk Score:  `1  Depression screen PHQ 2/9  Depression screen [facility] 2/9 05/30/2015 05/13/2014 11/19/2013 02/20/2013  Decreased Interest 0 0 0 0  Down, Depressed, Hopeless 0 0 0 0  PHQ - 2 Score 0 0 0 0     Review of Systems  Constitutional: Negative.   HENT: Negative.   Eyes: Negative.   Respiratory: Negative.   Cardiovascular: Negative.   Gastrointestinal: Negative.   Endocrine: Negative.   Genitourinary: Negative.   Musculoskeletal: Positive for arthralgias, gait problem, joint swelling and myalgias.  Skin: Negative.   Allergic/Immunologic: Negative.   Neurological: Positive for numbness.  Hematological: Negative.   Psychiatric/Behavioral: The patient is nervous/anxious.   All other systems reviewed and are negative.      Objective:   Physical Exam Constitutional: No distress . Vital signs reviewed. HENT: Normocephalic.  Atraumatic. Eyes: EOMI. No discharge. Cardiovascular: RRR. No JVD. Respiratory: CTA bilaterally. Normal effort. GI: BS +. Non-distended. Musculoskeletal:  Left foot tender with minimal palpation Right shoulder tenderness, improving Neurological:  Alert and oriented Motor: LUE/RLE 5/5.  RUE: shoulder abduction 4/5 (pain inhibition), distally 4+/5 (pain inhibition) Left lower extremity: Hip flexion, knee extension, 4+/5, ankle dorsiflexion 3+/5 (pain inhibition) Skin: Left foot with dressing c/d/i Psychiatric: Normal mood and behavior    Assessment & Plan:  32 year old female with history of OSA-untreated presents for transtional care management after receiving CIR for multi-ortho.  1. Functional deficits secondary to left foot  multi-Ortho, right shoulder contusion            Resume therapies when clear to weight bear  Cont follow up with Ortho  2. Pain Management:   Neurontin changed to 600mg  TID  Nortriptyline 25mg  qhs 10/26 on hold due to abx per pt  Cont Robaxin 4/day  Cont Tylenol  D/c Tramadol and Roxi  3. Tobacco abuse  Cont abstinence  4. Gait abnormality  Cont walker/wheelchair  Resume therapies once weight bearing  5. Morbid obesity  Encouraged weight loss  Meds reviewed Referrals reviewed- Needs PCP All questions answered

## 2017-12-08 DIAGNOSIS — S99922A Unspecified injury of left foot, initial encounter: Secondary | ICD-10-CM | POA: Diagnosis not present

## 2017-12-18 ENCOUNTER — Telehealth: Payer: Self-pay

## 2017-12-18 MED ORDER — METHOCARBAMOL 500 MG PO TABS: 500.0000 mg | ORAL_TABLET | Freq: Four times a day (QID) | ORAL | 0 refills | Status: DC | PRN

## 2017-12-18 NOTE — Telephone Encounter (Signed)
Pt called requesting refill. Called pt back and she is requesting Methocarbamol and Clonazepam to be sent to [facility]. I informed pt we can send Methocarbamol but she would need to contact PCP for Clonazepam refill.

## 2018-01-08 DIAGNOSIS — S99922A Unspecified injury of left foot, initial encounter: Secondary | ICD-10-CM | POA: Diagnosis not present

## 2018-01-15 ENCOUNTER — Encounter: Payer: Self-pay | Admitting: Physical Medicine & Rehabilitation

## 2018-01-15 ENCOUNTER — Encounter: Payer: Medicaid Other | Attending: Physical Medicine & Rehabilitation | Admitting: Physical Medicine & Rehabilitation

## 2018-01-15 ENCOUNTER — Other Ambulatory Visit: Payer: Self-pay

## 2018-01-15 VITALS — BP 138/83 | HR 83 | Ht 66.0 in | Wt 264.8 lb

## 2018-01-15 DIAGNOSIS — Z720 Tobacco use: Secondary | ICD-10-CM

## 2018-01-15 DIAGNOSIS — Z6841 Body Mass Index (BMI) 40.0 and over, adult: Secondary | ICD-10-CM | POA: Diagnosis not present

## 2018-01-15 DIAGNOSIS — G4733 Obstructive sleep apnea (adult) (pediatric): Secondary | ICD-10-CM | POA: Diagnosis not present

## 2018-01-15 DIAGNOSIS — R200 Anesthesia of skin: Secondary | ICD-10-CM | POA: Insufficient documentation

## 2018-01-15 DIAGNOSIS — G8918 Other acute postprocedural pain: Secondary | ICD-10-CM | POA: Diagnosis not present

## 2018-01-15 DIAGNOSIS — S9032XD Contusion of left foot, subsequent encounter: Secondary | ICD-10-CM | POA: Diagnosis not present

## 2018-01-15 DIAGNOSIS — Z87891 Personal history of nicotine dependence: Secondary | ICD-10-CM | POA: Insufficient documentation

## 2018-01-15 DIAGNOSIS — S40011S Contusion of right shoulder, sequela: Secondary | ICD-10-CM | POA: Diagnosis not present

## 2018-01-15 DIAGNOSIS — R269 Unspecified abnormalities of gait and mobility: Secondary | ICD-10-CM

## 2018-01-15 DIAGNOSIS — S40011D Contusion of right shoulder, subsequent encounter: Secondary | ICD-10-CM | POA: Insufficient documentation

## 2018-01-15 DIAGNOSIS — M25511 Pain in right shoulder: Secondary | ICD-10-CM

## 2018-01-15 DIAGNOSIS — M79672 Pain in left foot: Secondary | ICD-10-CM | POA: Diagnosis not present

## 2018-01-15 MED ORDER — NORTRIPTYLINE HCL 25 MG PO CAPS: 25.0000 mg | ORAL_CAPSULE | Freq: Every day | ORAL | 1 refills | Status: DC

## 2018-01-15 MED ORDER — METHOCARBAMOL 500 MG PO TABS: 500.0000 mg | ORAL_TABLET | Freq: Four times a day (QID) | ORAL | 0 refills | Status: AC | PRN

## 2018-01-15 NOTE — Progress Notes (Signed)
Subjective:    Patient ID: [name], female    DOB: 17-May-1985, 33 y.o.   MRN: [id number]  HPI 33 year old female with history of OSA-untreated presents for follow up for multi-ortho.  Last clinic visit 11/14/17.  Since that time, pt states she saw Ortho and is now WBAT.  She notes she still has pain. She is smoking 4/day. Denies falls. She has not lost weight. She has not started therapies.   Pain Inventory Average Pain 6 Pain Right Now 4 My pain is sharp, burning, stabbing, tingling and aching  In the last 24 hours, has pain interfered with the following? General activity 6 Relation with others 6 Enjoyment of life 6 What TIME of day is your pain at its worst? night Sleep (in general) Fair  Pain is worse with: walking, sitting and standing Pain improves with: rest, heat/ice and medication Relief from Meds: 10  Mobility use a walker how many minutes can you walk? 10 ability to climb steps?  yes do you drive?  no  Function not employed: date last employed . I need assistance with the following:  bathing, meal prep and household duties  Neuro/Psych numbness tingling spasms anxiety  Prior Studies Any changes since last visit?  no  Physicians involved in your care Any changes since last visit?  no   Family History  Problem Relation Age of Onset  . Cirrhosis Father    Social History   Socioeconomic History  . Marital status: Single    Spouse name: Not on file  . Number of children: Not on file  . Years of education: Not on file  . Highest education level: Not on file  Occupational History  . Not on file  Social Needs  . Financial resource strain: Not on file  . Food insecurity:    Worry: Not on file    Inability: Not on file  . Transportation needs:    Medical: Not on file    Non-medical: Not on file  Tobacco Use  . Smoking status: Current Some Day Smoker    Packs/day: 0.25    Types: Cigarettes    Start date: 02/21/2003  . Smokeless  tobacco: Never Used  Substance and Sexual Activity  . Alcohol use: No  . Drug use: No  . Sexual activity: Not Currently  Lifestyle  . Physical activity:    Days per week: Not on file    Minutes per session: Not on file  . Stress: Not on file  Relationships  . Social connections:    Talks on phone: Not on file    Gets together: Not on file    Attends religious service: Not on file    Active member of club or organization: Not on file    Attends meetings of clubs or organizations: Not on file    Relationship status: Not on file  Other Topics Concern  . Not on file  Social History Narrative  . Not on file   Past Surgical History:  Procedure Laterality Date  . CHOLECYSTECTOMY  2009  . DILITATION & CURRETTAGE/HYSTROSCOPY WITH NOVASURE ABLATION N/A 06/07/2015   Procedure: DILATATION & CURETTAGE/HYSTEROSCOPY WITH NOVASURE ABLATION;  Surgeon: [name], MD;  Location: [facility];  Service: Gynecology;  Laterality: N/A;  Uterine cavity length: 5.5 , uterine cavity width: 4.5,    power: 136, time: 1 minute 14 seconds.  . I&D EXTREMITY Left 10/21/2017   Procedure: 1.LEFT 2ND TOE DORSUM FOREIGN BODY, IRRIGATION AND DEBRIDEMENT BONE, EXTENSOR TENDOR LACERATION.  2.SECOND TOE PLANAR 2 CM X 1.75 CM WOUND WITH BONE,TISSUE LOSS. 3. GREAT TOE 0.5 CM LACERATION 4. THIRD TOE OPEN FRACTURE DISTAL PHALANX (DIP) JOINT 5. FOURTH TOE PLANTAR 3 CM WOUND WITH TENDON, BONE EXPOSURE PARTIAL EXCISION DISTAL PHALANX  6. 2 X 2 LACERATION PLANTAR FIRST MTP 7. 10 CM X  4  . LAPAROSCOPIC BILATERAL SALPINGECTOMY Bilateral 06/07/2015   Procedure: LAPAROSCOPIC BILATERAL SALPINGECTOMY;  Surgeon: [name], MD;  Location: [facility];  Service: Gynecology;  Laterality: Bilateral;  . left arm surgery     Past Medical History:  Diagnosis Date  . Asthma   . Sleep apnea    BP 138/83   Pulse 83   Ht 5\' 6"  (1.676 m)   Wt 264 lb 12.8 oz (120.1 kg)   SpO2 96%   BMI 42.74 kg/m   Opioid Risk Score:   Fall Risk Score:   `1  Depression screen PHQ 2/9  Depression screen [facility] 2/9 01/15/2018 05/30/2015 05/13/2014 11/19/2013 02/20/2013  Decreased Interest 0 0 0 0 0  Down, Depressed, Hopeless 0 0 0 0 0  PHQ - 2 Score 0 0 0 0 0     Review of Systems  Constitutional: Negative.   HENT: Negative.   Eyes: Negative.   Respiratory: Negative.   Cardiovascular: Negative.   Gastrointestinal: Negative.   Endocrine: Negative.   Genitourinary: Negative.   Musculoskeletal: Positive for arthralgias, gait problem, joint swelling and myalgias.  Skin: Negative.   Allergic/Immunologic: Negative.   Neurological: Positive for numbness.  Hematological: Negative.   Psychiatric/Behavioral: The patient is nervous/anxious.   All other systems reviewed and are negative.      Objective:   Physical Exam Constitutional: No distress . Vital signs reviewed. HENT: Normocephalic.  Atraumatic. Eyes: EOMI. No discharge. Cardiovascular: RRR. No JVD. Respiratory: CTA bilaterally. Normal effort. GI: BS +. Non-distended. Musculoskeletal:  Left foot tender with minimal palpation Right shoulder tenderness, improving Neurological:  Alert and oriented Motor: LUE/RLE 5/5.  RUE: shoulder abduction 4/5 (pain inhibition), distally 4+/5 (pain inhibition) Left lower extremity: Hip flexion, knee extension, 4+/5, ankle dorsiflexion 3+/5 (pain inhibition) Skin: Left foot with shoe Psychiatric: Normal mood and behavior    Assessment & Plan:  33 year old female with history of OSA-untreated presents for follow up for multi-ortho.  1. Functional deficits secondary to left foot multi-Ortho, right shoulder contusion  Therapies ordered  Cont follow up with Ortho  2. Pain Management:   Neurontin changed to 600mg  TID  Nortriptyline 25mg  restarted  Cont Robaxin 4/day  Cont Tylenol  D/ced Tramadol and Roxi  3. Tobacco abuse  Using 4/day  Encouraged abstinence  4. Gait abnormality  Cont walker  Resume therapies  5. Morbid  obesity  Encouraged weight loss

## 2018-01-20 DIAGNOSIS — S91112D Laceration without foreign body of left great toe without damage to nail, subsequent encounter: Secondary | ICD-10-CM | POA: Diagnosis not present

## 2018-02-08 DIAGNOSIS — S99922A Unspecified injury of left foot, initial encounter: Secondary | ICD-10-CM | POA: Diagnosis not present

## 2018-02-26 ENCOUNTER — Encounter: Payer: Medicaid Other | Attending: Physical Medicine & Rehabilitation | Admitting: Physical Medicine & Rehabilitation

## 2018-02-26 DIAGNOSIS — R269 Unspecified abnormalities of gait and mobility: Secondary | ICD-10-CM | POA: Insufficient documentation

## 2018-02-26 DIAGNOSIS — Z6841 Body Mass Index (BMI) 40.0 and over, adult: Secondary | ICD-10-CM | POA: Insufficient documentation

## 2018-02-26 DIAGNOSIS — R200 Anesthesia of skin: Secondary | ICD-10-CM | POA: Insufficient documentation

## 2018-02-26 DIAGNOSIS — S9032XD Contusion of left foot, subsequent encounter: Secondary | ICD-10-CM | POA: Insufficient documentation

## 2018-02-26 DIAGNOSIS — S40011D Contusion of right shoulder, subsequent encounter: Secondary | ICD-10-CM | POA: Insufficient documentation

## 2018-02-26 DIAGNOSIS — G4733 Obstructive sleep apnea (adult) (pediatric): Secondary | ICD-10-CM | POA: Insufficient documentation

## 2018-02-26 DIAGNOSIS — Z87891 Personal history of nicotine dependence: Secondary | ICD-10-CM | POA: Insufficient documentation

## 2018-03-09 DIAGNOSIS — S99922A Unspecified injury of left foot, initial encounter: Secondary | ICD-10-CM | POA: Diagnosis not present

## 2018-03-09 DIAGNOSIS — S0083XA Contusion of other part of head, initial encounter: Secondary | ICD-10-CM | POA: Diagnosis not present

## 2018-03-09 DIAGNOSIS — S022XXA Fracture of nasal bones, initial encounter for closed fracture: Secondary | ICD-10-CM | POA: Diagnosis not present

## 2018-03-20 DIAGNOSIS — S038XXA Sprain of joints and ligaments of other parts of head, initial encounter: Secondary | ICD-10-CM | POA: Diagnosis not present

## 2018-03-20 DIAGNOSIS — J342 Deviated nasal septum: Secondary | ICD-10-CM | POA: Diagnosis not present

## 2018-04-09 DIAGNOSIS — S99922A Unspecified injury of left foot, initial encounter: Secondary | ICD-10-CM | POA: Diagnosis not present

## 2018-04-16 ENCOUNTER — Other Ambulatory Visit: Payer: Self-pay | Admitting: Physical Medicine & Rehabilitation

## 2018-06-09 DIAGNOSIS — M79672 Pain in left foot: Secondary | ICD-10-CM | POA: Diagnosis not present

## 2018-06-09 DIAGNOSIS — G8921 Chronic pain due to trauma: Secondary | ICD-10-CM | POA: Diagnosis not present

## 2018-09-09 DIAGNOSIS — S99922A Unspecified injury of left foot, initial encounter: Secondary | ICD-10-CM | POA: Diagnosis not present

## 2018-10-09 DIAGNOSIS — S99922A Unspecified injury of left foot, initial encounter: Secondary | ICD-10-CM | POA: Diagnosis not present

## 2018-11-03 DIAGNOSIS — Z03818 Encounter for observation for suspected exposure to other biological agents ruled out: Secondary | ICD-10-CM | POA: Diagnosis not present

## 2018-11-09 DIAGNOSIS — S99922A Unspecified injury of left foot, initial encounter: Secondary | ICD-10-CM | POA: Diagnosis not present

## 2020-01-20 DIAGNOSIS — M545 Low back pain, unspecified: Secondary | ICD-10-CM | POA: Diagnosis not present

## 2020-02-01 DIAGNOSIS — M5136 Other intervertebral disc degeneration, lumbar region: Secondary | ICD-10-CM | POA: Diagnosis not present

## 2020-02-01 DIAGNOSIS — M549 Dorsalgia, unspecified: Secondary | ICD-10-CM | POA: Diagnosis not present

## 2020-02-03 DIAGNOSIS — M47816 Spondylosis without myelopathy or radiculopathy, lumbar region: Secondary | ICD-10-CM | POA: Diagnosis not present

## 2020-02-03 DIAGNOSIS — M47814 Spondylosis without myelopathy or radiculopathy, thoracic region: Secondary | ICD-10-CM | POA: Diagnosis not present

## 2020-02-03 DIAGNOSIS — M5136 Other intervertebral disc degeneration, lumbar region: Secondary | ICD-10-CM | POA: Diagnosis not present

## 2020-02-03 DIAGNOSIS — M549 Dorsalgia, unspecified: Secondary | ICD-10-CM | POA: Diagnosis not present

## 2020-02-05 DIAGNOSIS — Z23 Encounter for immunization: Secondary | ICD-10-CM | POA: Diagnosis not present

## 2020-03-02 ENCOUNTER — Ambulatory Visit: Payer: Self-pay

## 2020-03-04 DIAGNOSIS — Z23 Encounter for immunization: Secondary | ICD-10-CM | POA: Diagnosis not present

## 2020-05-12 DIAGNOSIS — X500XXA Overexertion from strenuous movement or load, initial encounter: Secondary | ICD-10-CM | POA: Diagnosis not present

## 2020-05-12 DIAGNOSIS — Y9389 Activity, other specified: Secondary | ICD-10-CM | POA: Diagnosis not present

## 2020-05-12 DIAGNOSIS — M5146 Schmorl's nodes, lumbar region: Secondary | ICD-10-CM | POA: Diagnosis not present

## 2020-05-12 DIAGNOSIS — M545 Low back pain, unspecified: Secondary | ICD-10-CM | POA: Diagnosis not present

## 2020-05-12 DIAGNOSIS — S39012A Strain of muscle, fascia and tendon of lower back, initial encounter: Secondary | ICD-10-CM | POA: Diagnosis not present

## 2020-05-12 DIAGNOSIS — Z720 Tobacco use: Secondary | ICD-10-CM | POA: Diagnosis not present

## 2020-05-12 DIAGNOSIS — Y929 Unspecified place or not applicable: Secondary | ICD-10-CM | POA: Diagnosis not present

## 2020-05-12 DIAGNOSIS — M5136 Other intervertebral disc degeneration, lumbar region: Secondary | ICD-10-CM | POA: Diagnosis not present

## 2020-06-14 DIAGNOSIS — M791 Myalgia, unspecified site: Secondary | ICD-10-CM | POA: Diagnosis not present

## 2020-06-14 DIAGNOSIS — R509 Fever, unspecified: Secondary | ICD-10-CM | POA: Diagnosis not present

## 2020-06-14 DIAGNOSIS — U071 COVID-19: Secondary | ICD-10-CM | POA: Diagnosis not present

## 2020-06-14 DIAGNOSIS — R059 Cough, unspecified: Secondary | ICD-10-CM | POA: Diagnosis not present

## 2020-06-14 DIAGNOSIS — Z720 Tobacco use: Secondary | ICD-10-CM | POA: Diagnosis not present

## 2020-06-14 DIAGNOSIS — R519 Headache, unspecified: Secondary | ICD-10-CM | POA: Diagnosis not present

## 2021-04-28 DIAGNOSIS — M545 Low back pain, unspecified: Secondary | ICD-10-CM | POA: Diagnosis not present

## 2021-05-03 DIAGNOSIS — F4310 Post-traumatic stress disorder, unspecified: Secondary | ICD-10-CM | POA: Diagnosis not present

## 2021-05-26 DIAGNOSIS — F4310 Post-traumatic stress disorder, unspecified: Secondary | ICD-10-CM | POA: Diagnosis not present

## 2021-05-29 ENCOUNTER — Emergency Department (HOSPITAL_COMMUNITY): Payer: Medicaid Other

## 2021-05-29 ENCOUNTER — Other Ambulatory Visit: Payer: Self-pay

## 2021-05-29 ENCOUNTER — Emergency Department (HOSPITAL_BASED_OUTPATIENT_CLINIC_OR_DEPARTMENT_OTHER)
Admission: EM | Admit: 2021-05-29 | Discharge: 2021-05-29 | Disposition: A | Discharge status: Home / Self Care | Payer: Medicaid Other | Attending: Emergency Medicine | Admitting: Emergency Medicine

## 2021-05-29 ENCOUNTER — Emergency Department (HOSPITAL_BASED_OUTPATIENT_CLINIC_OR_DEPARTMENT_OTHER): Payer: Medicaid Other

## 2021-05-29 ENCOUNTER — Encounter (HOSPITAL_BASED_OUTPATIENT_CLINIC_OR_DEPARTMENT_OTHER): Payer: Self-pay | Admitting: Emergency Medicine

## 2021-05-29 DIAGNOSIS — M545 Low back pain, unspecified: Secondary | ICD-10-CM | POA: Diagnosis not present

## 2021-05-29 DIAGNOSIS — M47812 Spondylosis without myelopathy or radiculopathy, cervical region: Secondary | ICD-10-CM | POA: Diagnosis not present

## 2021-05-29 DIAGNOSIS — M50223 Other cervical disc displacement at C6-C7 level: Secondary | ICD-10-CM | POA: Diagnosis not present

## 2021-05-29 DIAGNOSIS — R1031 Right lower quadrant pain: Secondary | ICD-10-CM | POA: Diagnosis not present

## 2021-05-29 DIAGNOSIS — M5127 Other intervertebral disc displacement, lumbosacral region: Secondary | ICD-10-CM | POA: Diagnosis not present

## 2021-05-29 DIAGNOSIS — M5124 Other intervertebral disc displacement, thoracic region: Secondary | ICD-10-CM | POA: Diagnosis not present

## 2021-05-29 DIAGNOSIS — M5126 Other intervertebral disc displacement, lumbar region: Secondary | ICD-10-CM | POA: Diagnosis not present

## 2021-05-29 DIAGNOSIS — M439 Deforming dorsopathy, unspecified: Secondary | ICD-10-CM | POA: Diagnosis not present

## 2021-05-29 DIAGNOSIS — R202 Paresthesia of skin: Secondary | ICD-10-CM | POA: Diagnosis not present

## 2021-05-29 DIAGNOSIS — M48061 Spinal stenosis, lumbar region without neurogenic claudication: Secondary | ICD-10-CM | POA: Diagnosis not present

## 2021-05-29 DIAGNOSIS — M50222 Other cervical disc displacement at C5-C6 level: Secondary | ICD-10-CM | POA: Diagnosis not present

## 2021-05-29 LAB — COMPREHENSIVE METABOLIC PANEL
ALT: 29 U/L (ref 0–44)
AST: 17 U/L (ref 15–41)
Albumin: 4.7 g/dL (ref 3.5–5.0)
Alkaline Phosphatase: 44 U/L (ref 38–126)
Anion gap: 11 (ref 5–15)
BUN: 11 mg/dL (ref 6–20)
CO2: 24 mmol/L (ref 22–32)
Calcium: 10 mg/dL (ref 8.9–10.3)
Chloride: 105 mmol/L (ref 98–111)
Creatinine, Ser: 0.53 mg/dL (ref 0.44–1.00)
GFR, Estimated: >60 mL/min (ref >60)
Glucose, Bld: 101 mg/dL — ABNORMAL HIGH (ref 70–99)
Potassium: 4.1 mmol/L (ref 3.5–5.1)
Sodium: 140 mmol/L (ref 135–145)
Total Bilirubin: 0.5 mg/dL (ref 0.3–1.2)
Total Protein: 7.6 g/dL (ref 6.5–8.1)

## 2021-05-29 LAB — CBC WITH DIFFERENTIAL/PLATELET
Abs Immature Granulocytes: 0.01 10*3/uL (ref 0.00–0.07)
Basophils Absolute: 0 10*3/uL (ref 0.0–0.1)
Basophils Relative: 0 %
Eosinophils Absolute: 0.1 10*3/uL (ref 0.0–0.5)
Eosinophils Relative: 2 %
HCT: 40.6 % (ref 36.0–46.0)
Hemoglobin: 13.7 g/dL (ref 12.0–15.0)
Immature Granulocytes: 0 %
Lymphocytes Relative: 38 %
Lymphs Abs: 2.4 10*3/uL (ref 0.7–4.0)
MCH: 32.2 pg (ref 26.0–34.0)
MCHC: 33.7 g/dL (ref 30.0–36.0)
MCV: 95.5 fL (ref 80.0–100.0)
Monocytes Absolute: 0.3 10*3/uL (ref 0.1–1.0)
Monocytes Relative: 5 %
Neutro Abs: 3.4 10*3/uL (ref 1.7–7.7)
Neutrophils Relative %: 55 %
Platelets: 319 10*3/uL (ref 150–400)
RBC: 4.25 MIL/uL (ref 3.87–5.11)
RDW: 12 % (ref 11.5–15.5)
WBC: 6.2 10*3/uL (ref 4.0–10.5)
nRBC: 0 % (ref 0.0–0.2)

## 2021-05-29 LAB — URINALYSIS, ROUTINE W REFLEX MICROSCOPIC
Bilirubin Urine: NEGATIVE
Glucose, UA: NEGATIVE mg/dL
Ketones, ur: NEGATIVE mg/dL
Nitrite: NEGATIVE
Protein, ur: NEGATIVE mg/dL
Specific Gravity, Urine: 1.015 (ref 1.005–1.030)
pH: 6.5 (ref 5.0–8.0)

## 2021-05-29 LAB — CK: Total CK: 82 U/L (ref 38–234)

## 2021-05-29 LAB — PREGNANCY, URINE: Preg Test, Ur: NEGATIVE

## 2021-05-29 IMAGING — CT CT L SPINE W/O CM
3 of 4 series · 10 of 33 positions shown, 12 images · non-contrast
Comparison: None Available.

CLINICAL DATA: burning sensation in arms and legs with lower back
pain



[Series 5: coronal bone · coronal · 0.34mm/px · 3 of 93 slices shown]
[im 19/93  bone]
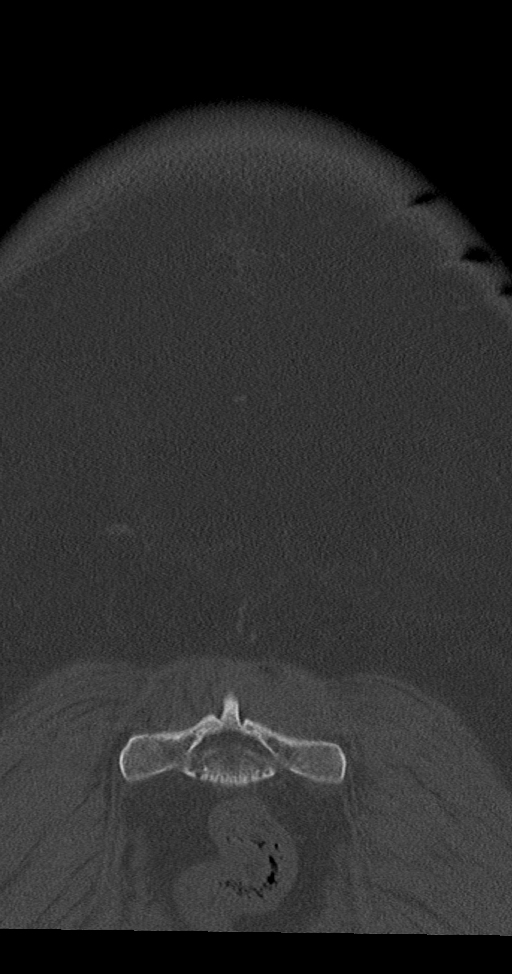
[im 37/93  bone]
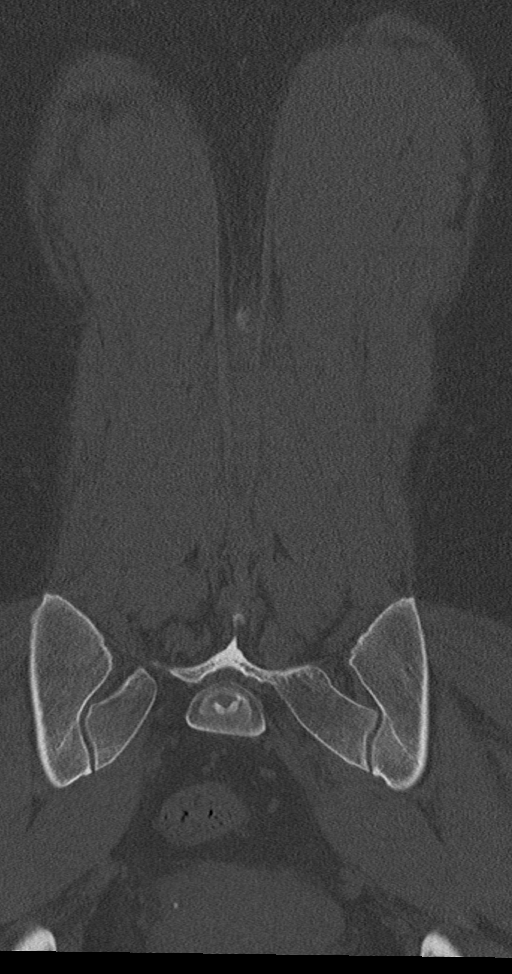
[im 56/93  bone]
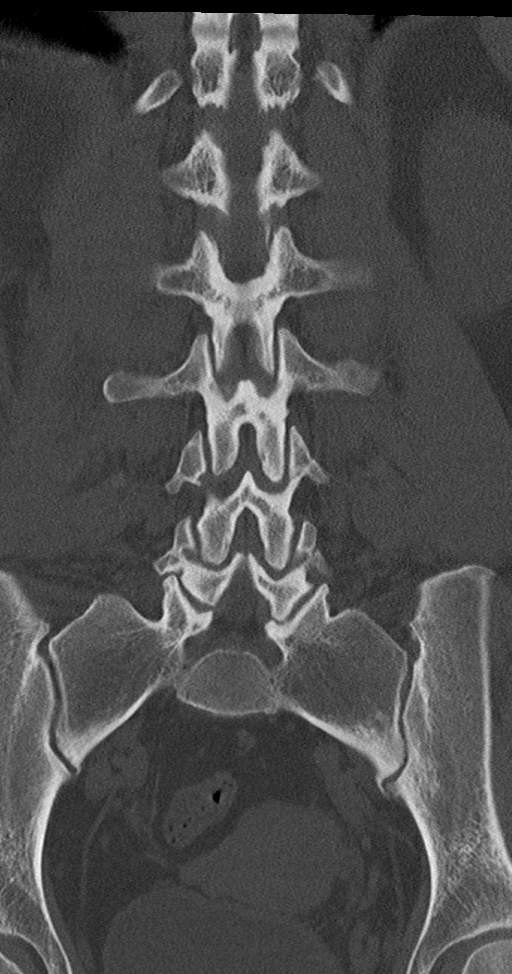

[Series 6: sagittal bone · sagittal · 0.36mm/px · 5 of 88 slices shown, 6 images]
[im 30/88  bone]
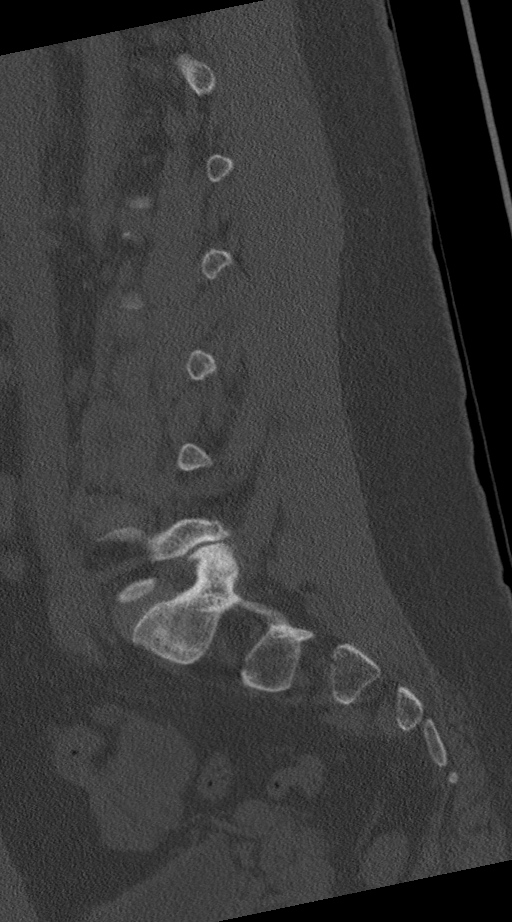
[im 37/88  bone]
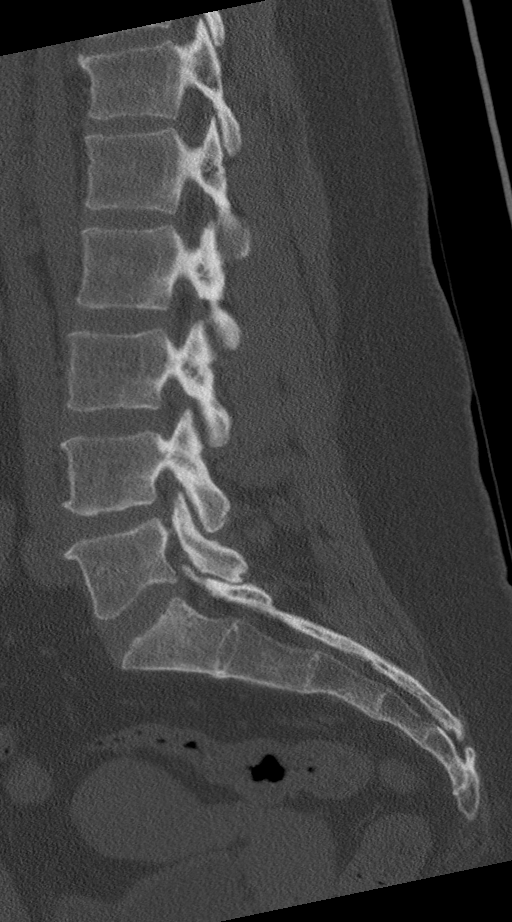
[im 44/88  soft-tissue]
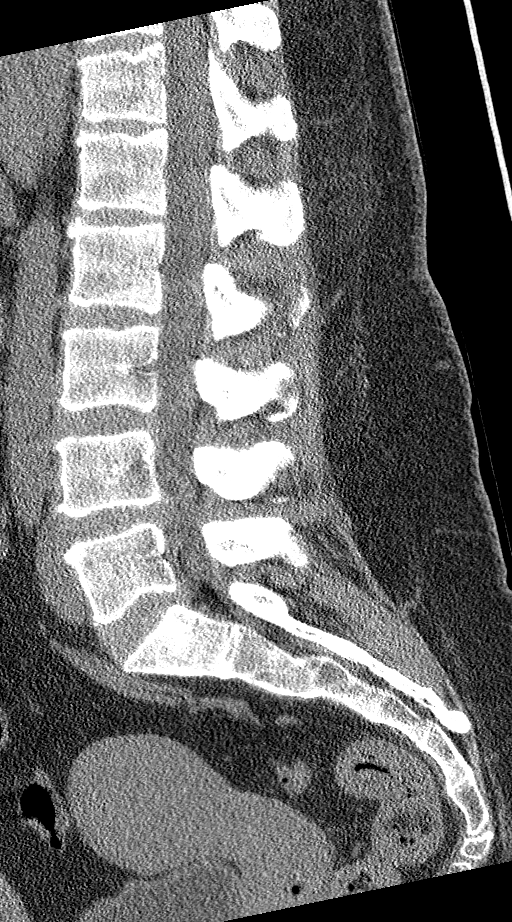
[im 44/88  bone]
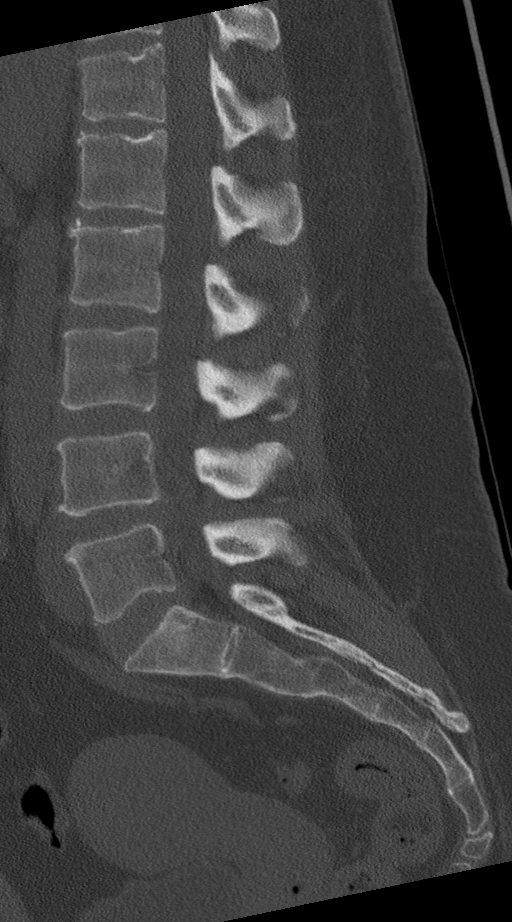
[im 51/88  bone]
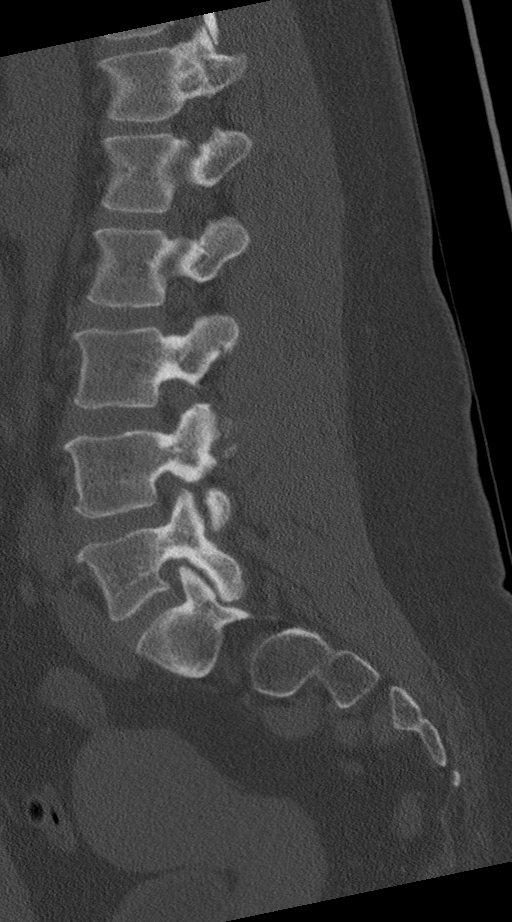
[im 59/88  bone]
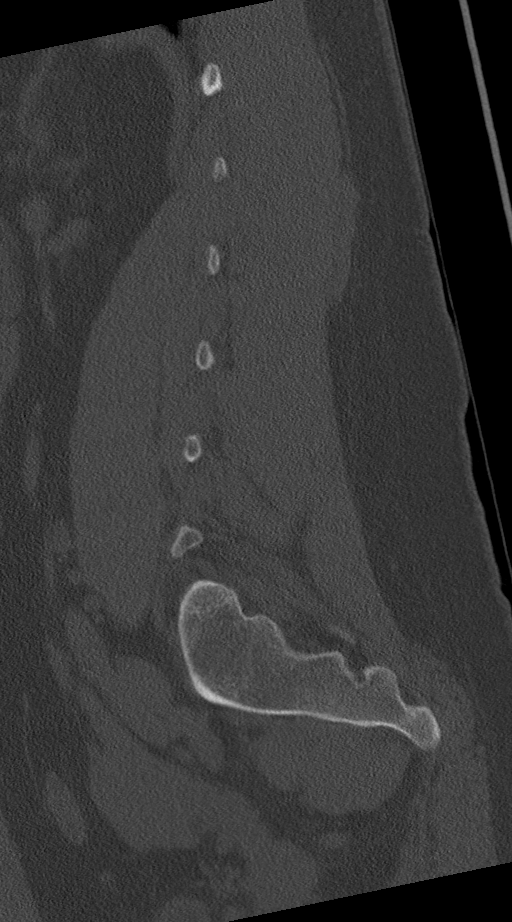

[Series 8: ax disc · axial · 0.25mm/px · z∈[+268,+372]mm · 2 of 127 slices shown, 3 images]
[im 51/127  soft-tissue]
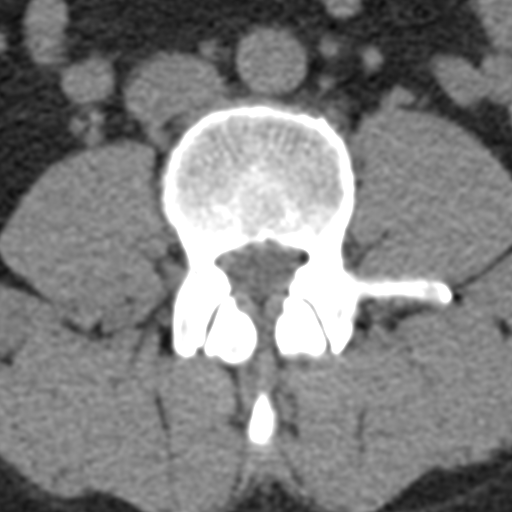
[im 51/127  bone]
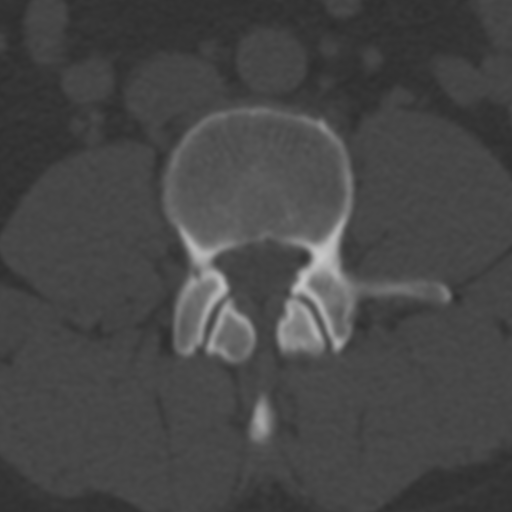
[im 101/127  bone]
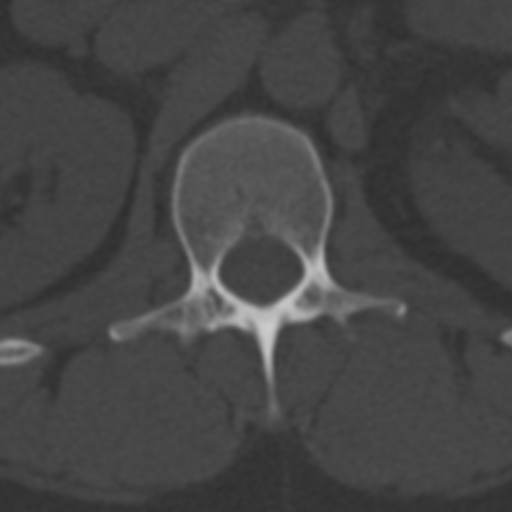

[10 of 33 positions shown; findings below may reference images not displayed]

FINDINGS: Segmentation: 5 non rib-bearing lumbar vertebral bodies.

Alignment: No substantial sagittal subluxation.

Vertebrae: No evidence of acute fracture. Degenerative Schmorl's
nodes involving the superior T12, L1 and L2 endplates.

Paraspinal and other soft tissues: Cholecystectomy clips.

Disc levels:

At L4-L5, moderately sized inferiorly directed disc protrusion which
narrows the subarticular recesses with at least mild to moderate
canal stenosis. Mild disc height loss at this level.

Small central disc protrusion at L5-S1.

Otherwise, no evidence of significant bony canal or foraminal
stenosis.
IMPRESSION: 1. At L4-L5, moderately sized inferiorly directed disc protrusion
which narrows the subarticular recesses with at least mild to
moderate canal stenosis. An MRI could better evaluate the canal and
foramina if clinically warranted.
2. No evidence of acute fracture or malalignment.

## 2021-05-29 IMAGING — CT CT CERVICAL SPINE W/O CM
3 of 4 series · 13 of 33 positions shown, 16 images · non-contrast
Comparison: CT of the cervical spine from [DATE].

CLINICAL DATA: burning sensation in arms and legs.



[Series 5: cor bone · coronal · 0.36mm/px · 3 of 71 slices shown]
[im 15/71  bone]
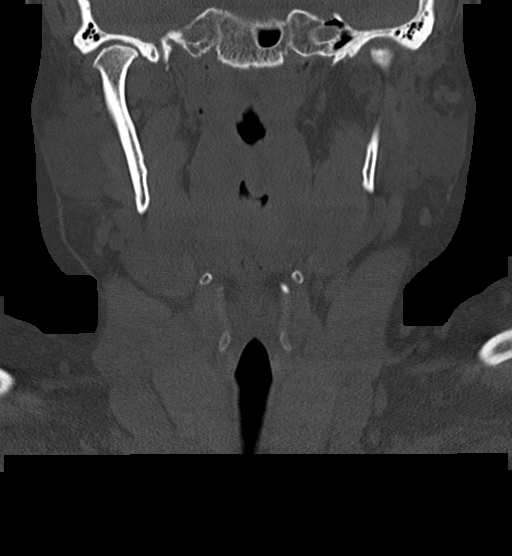
[im 29/71  bone]
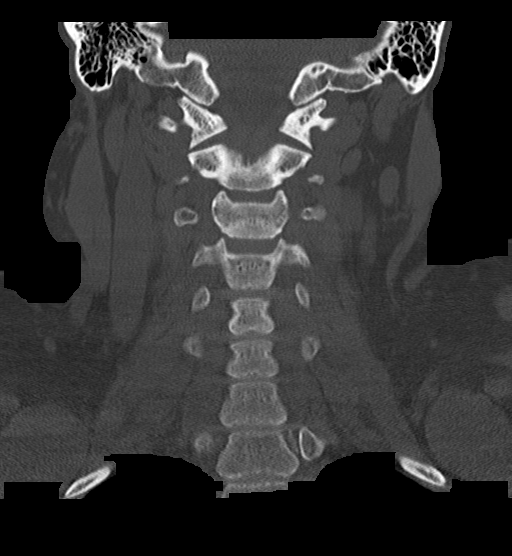
[im 43/71  bone]
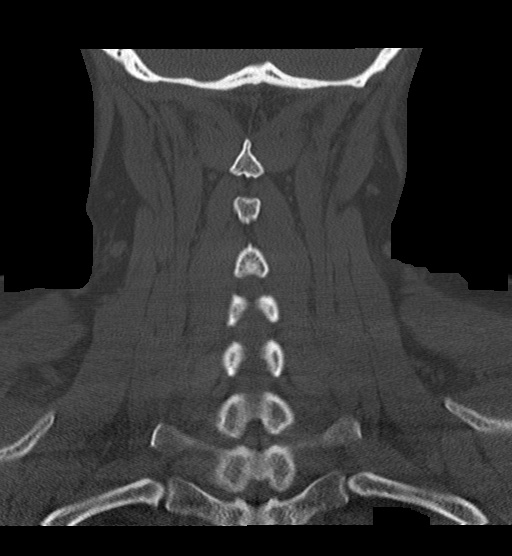

[Series 6: sag bone · sagittal · 0.28mm/px · 5 of 94 slices shown, 6 images]
[im 32/94  bone]
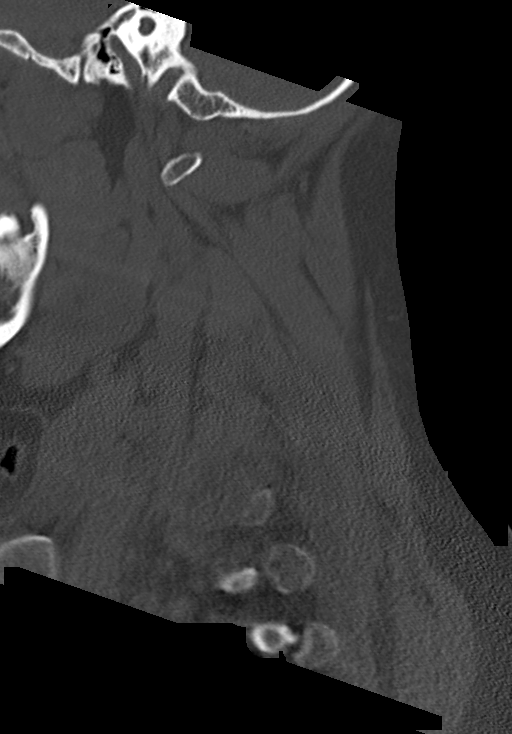
[im 39/94  bone]
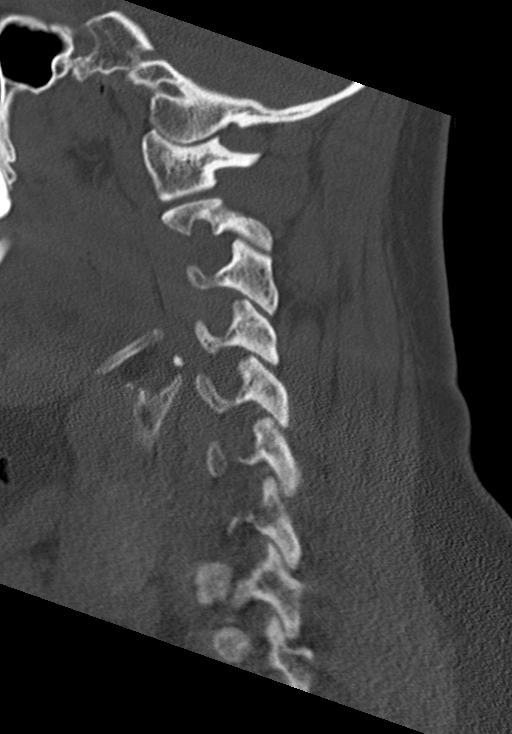
[im 47/94  soft-tissue]
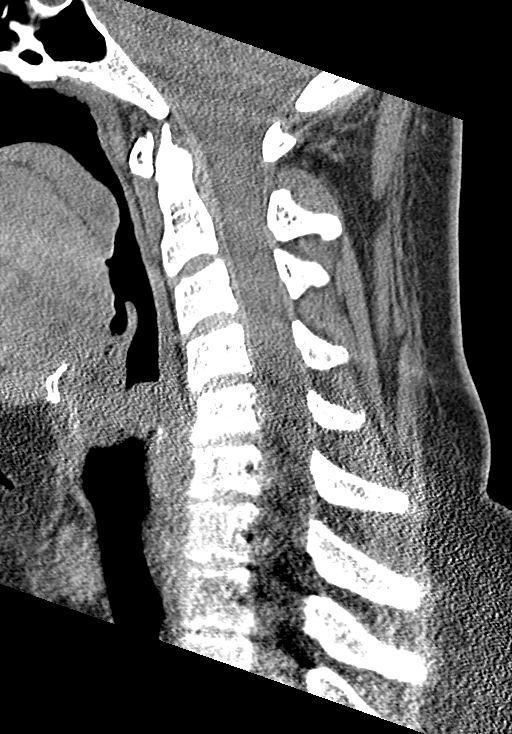
[im 47/94  bone]
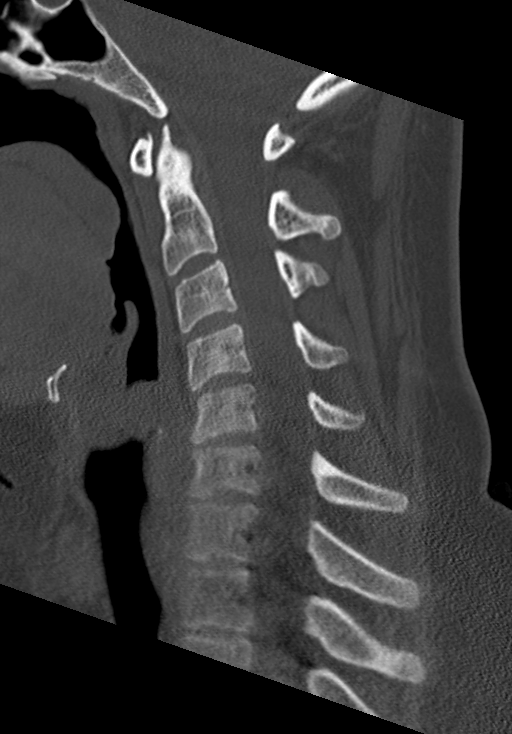
[im 55/94  bone]
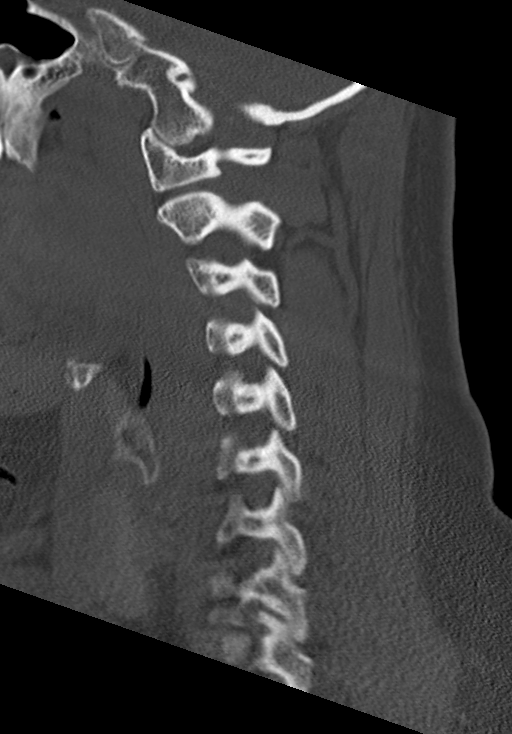
[im 63/94  bone]
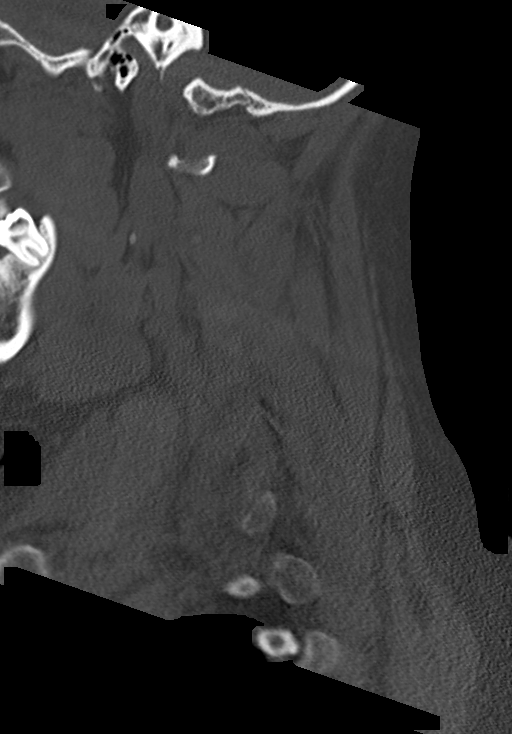

[Series 7: orthogonal axials · axial · 0.25mm/px · z∈[+852,+943]mm · 5 of 82 slices shown, 7 images]
[im 14/82  soft-tissue]
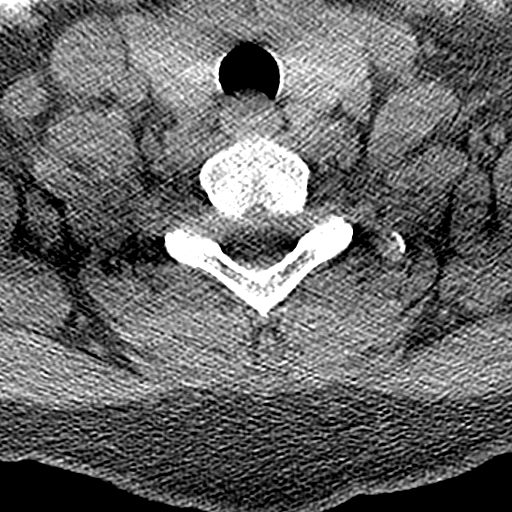
[im 14/82  bone]
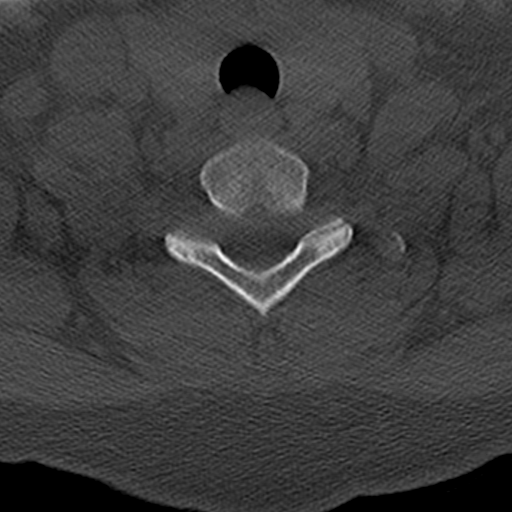
[im 28/82  bone]
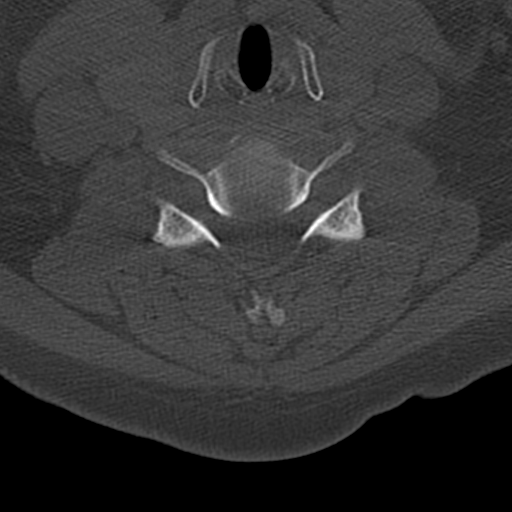
[im 41/82  bone]
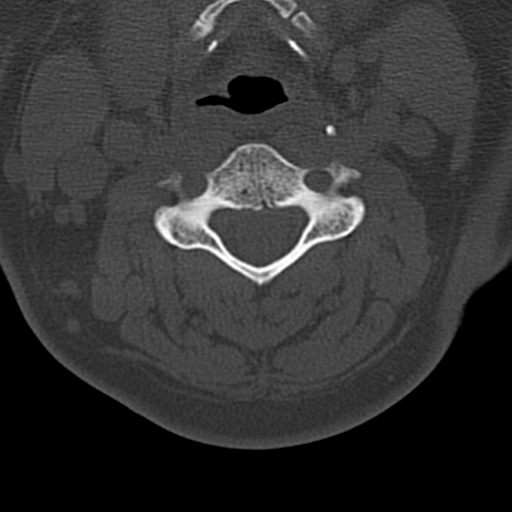
[im 55/82  bone]
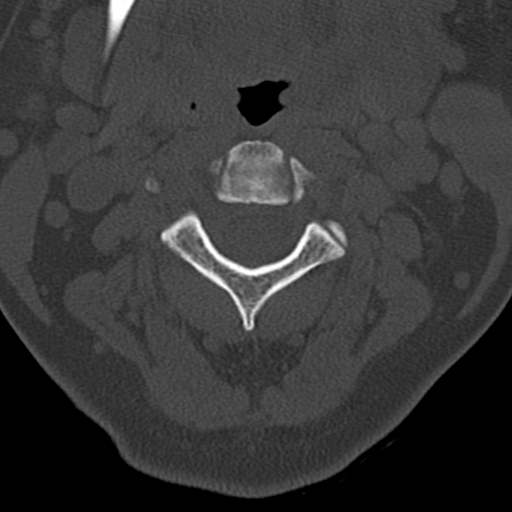
[im 68/82  soft-tissue]
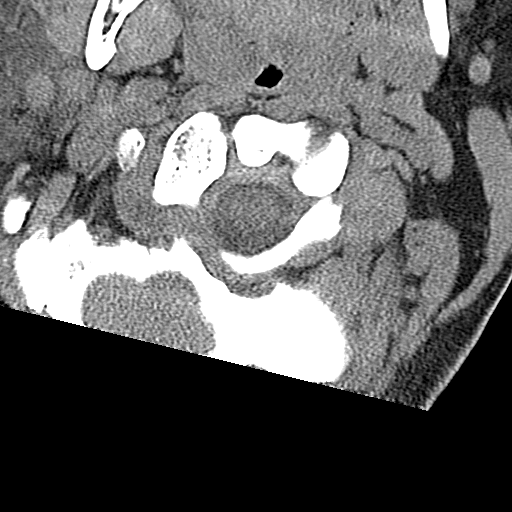
[im 68/82  bone]
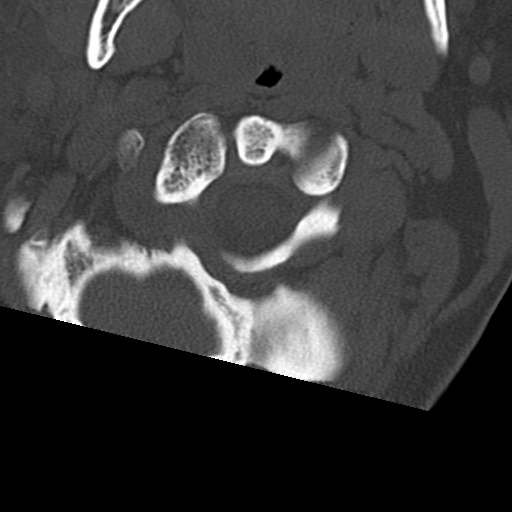

[13 of 33 positions shown; findings below may reference images not displayed]

FINDINGS: Alignment: Reversal of the normal cervical lordosis. No substantial
sagittal subluxation.

Skull base and vertebrae: Vertebral body heights are maintained. No
evidence of acute fracture.

Soft tissues and spinal canal: No prevertebral fluid or swelling. No
visible canal hematoma.

Disc levels: Mild multilevel bony degenerative change. No evidence
of significant bony canal or foraminal stenosis.

Upper chest: Visualized lung apices are clear.
IMPRESSION: 1. No evidence of acute fracture or traumatic malalignment.
2. No evidence of significant bony canal or foraminal stenosis.

## 2021-05-29 IMAGING — MR MR LUMBAR SPINE W/O CM
4 of 7 series · 18 of 48 positions shown · non-contrast
Comparison: No prior MRI of the lumbar spine.

CLINICAL DATA: Burning/paresthesias in arms and legs.

EXAM:
MRI LUMBAR SPINE WITHOUT CONTRAST
TECHNIQUE: Multiplanar, multisequence MR imaging of the lumbar spine was
performed. No intravenous contrast was administered.

[Series 3: T1 · axial · 4.0mm · 0.39mm/px · z∈[-106,+69]mm · 3 of 45 slices shown]
[im 5/45]
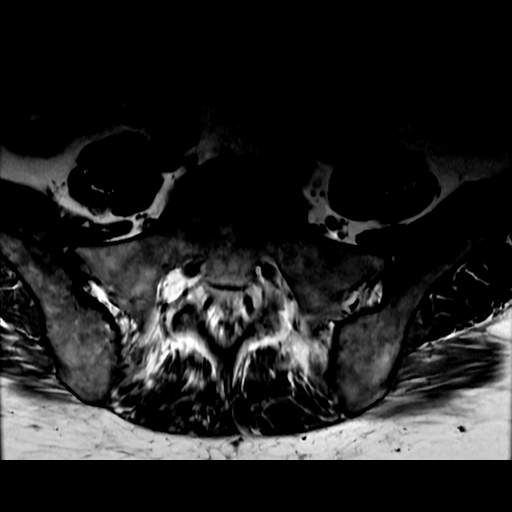
[im 25/45]
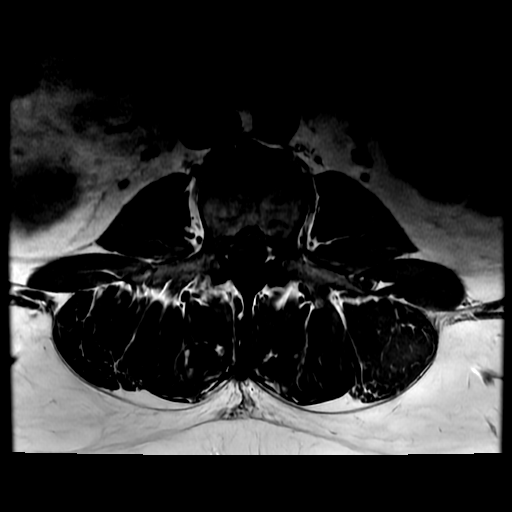
[im 40/45]
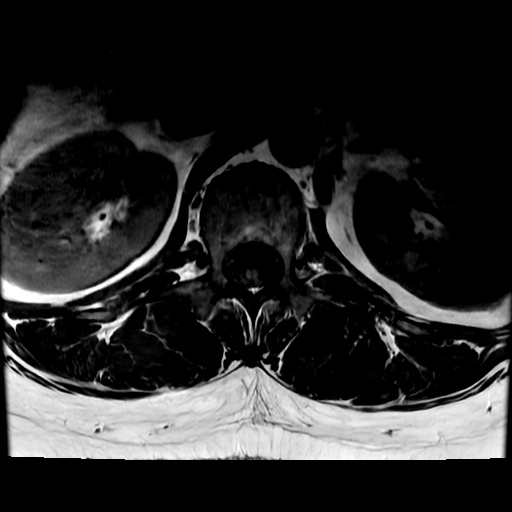

[Series 4: T2 · sagittal · 4.0mm · 0.55mm/px · 4 of 16 slices shown (1 of 3)]
[im 1/16]
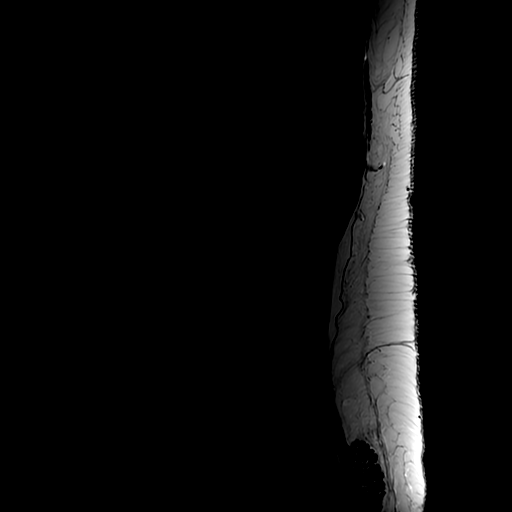
[im 6/16]
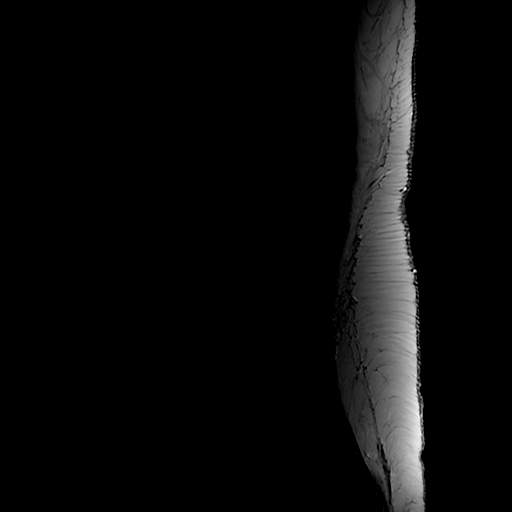
[im 11/16]
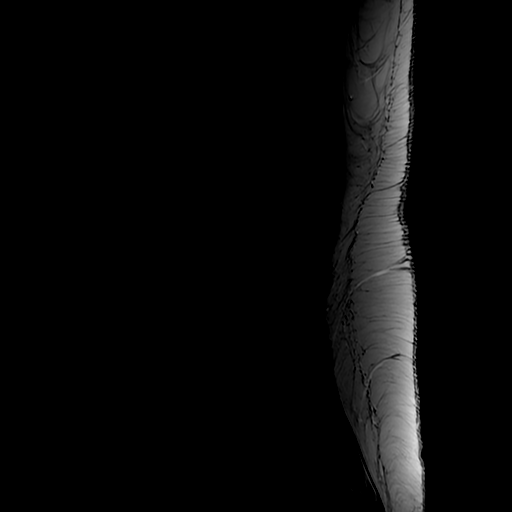
[im 16/16]
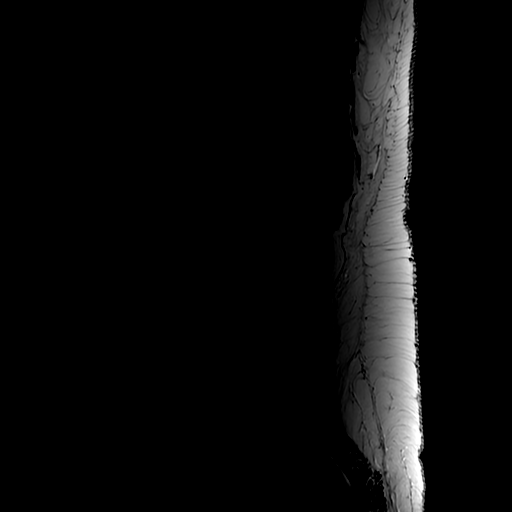

[Series 8: T2 · axial · 4.0mm · 0.39mm/px · z∈[-539,-333]mm · 6 of 28 slices shown (2 of 3)]
[im 1/28]
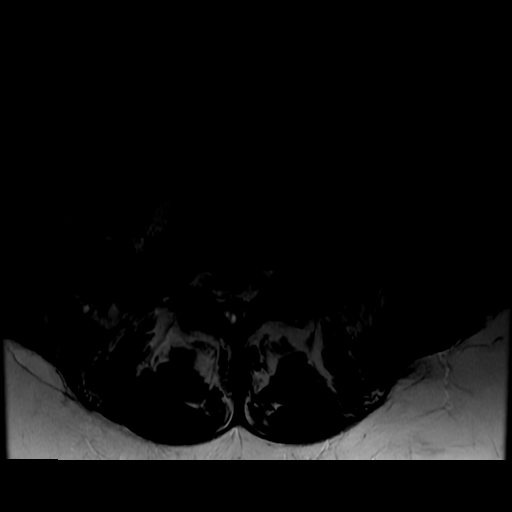
[im 6/28]
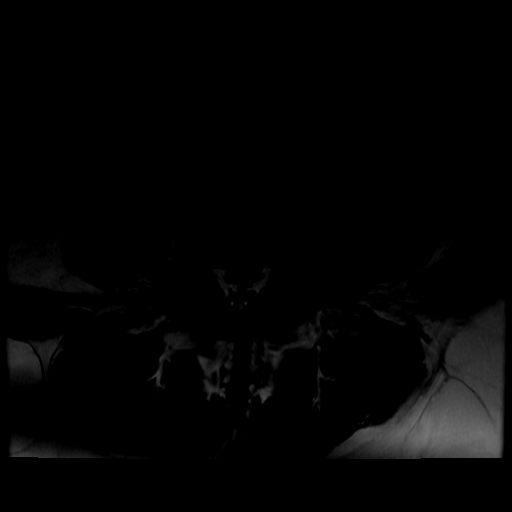
[im 11/28]
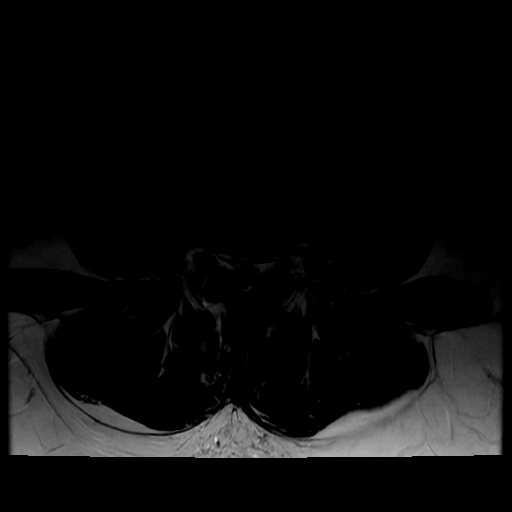
[im 17/28]
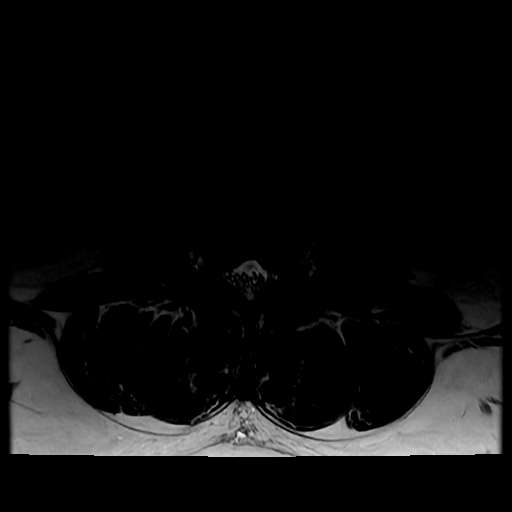
[im 22/28]
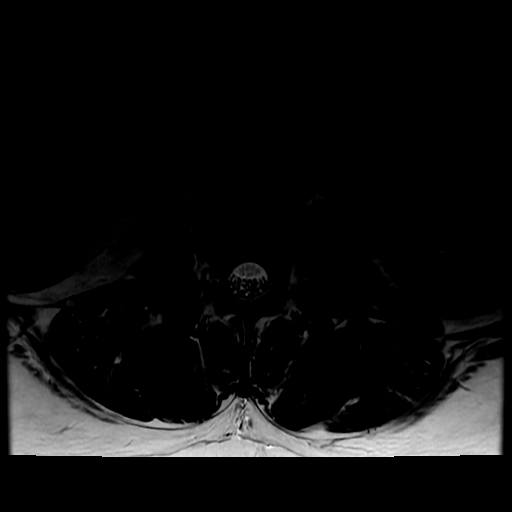
[im 28/28]
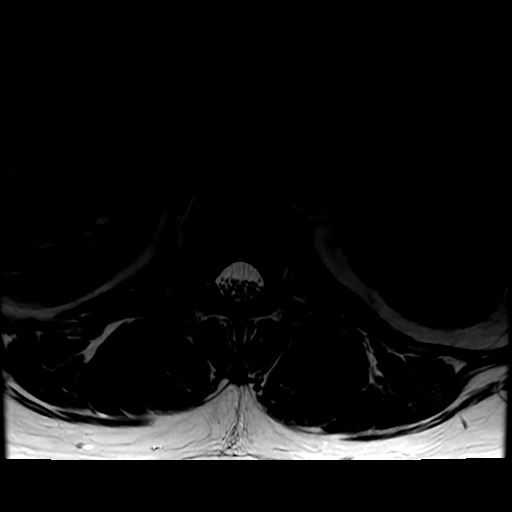

[Series 9: T2 · axial · 4.0mm · 0.39mm/px · z∈[-517,-329]mm · 5 of 44 slices shown (3 of 3)]
[im 1/44]
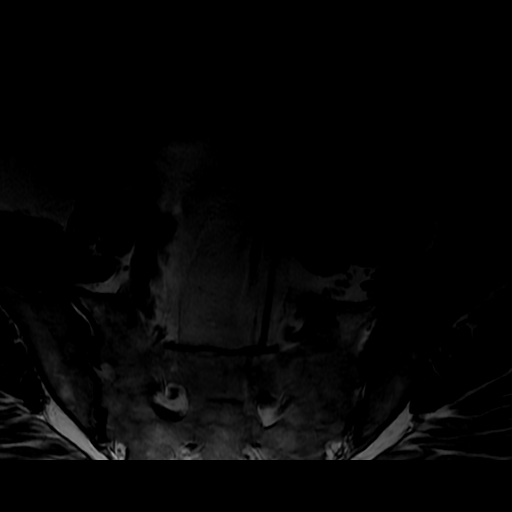
[im 5/44]
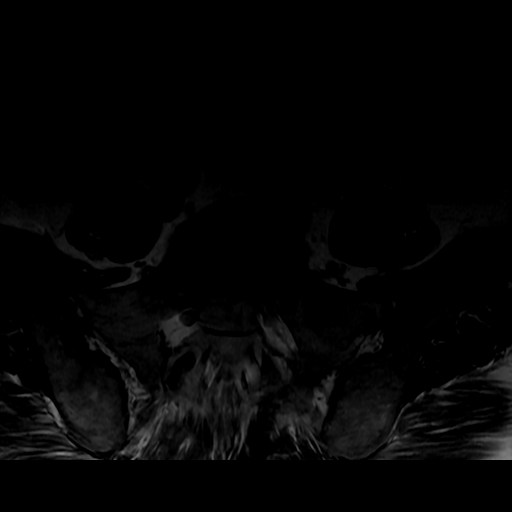
[im 15/44]
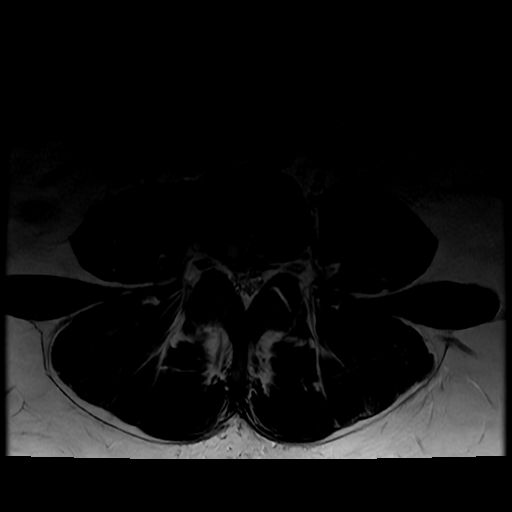
[im 24/44]
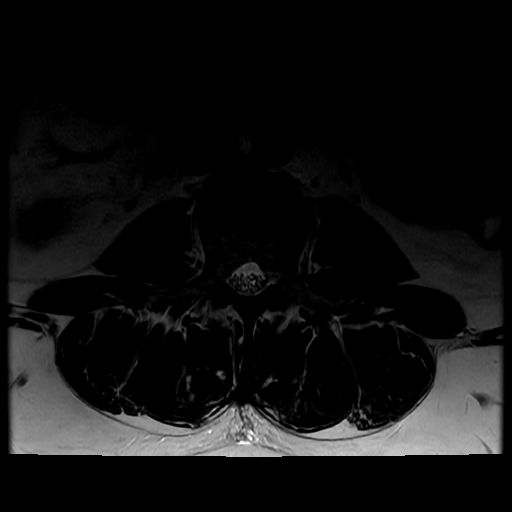
[im 39/44]
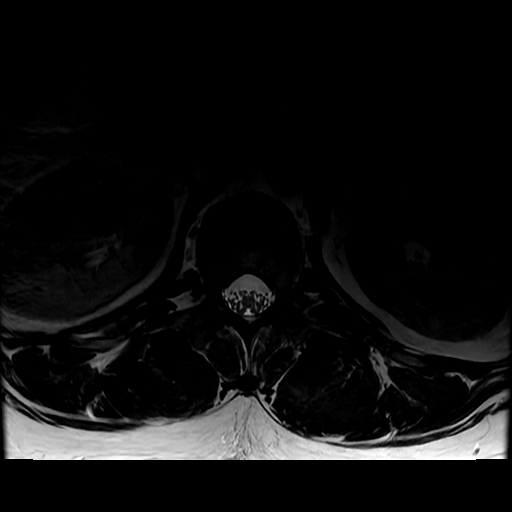

[18 of 48 positions shown; findings below may reference images not displayed]

FINDINGS: Evaluation is somewhat limited by motion artifact.

Segmentation:  Standard.

Alignment: S shaped curvature of the thoracolumbar spine with
levocurvature of the lumbar spine. No listhesis. Straightening of
the normal lumbar lordosis.

Vertebrae: No acute fracture or suspicious osseous lesion. Mild
endplate degenerative changes at L4-L5.

Conus medullaris and cauda equina: Conus extends to the L1-L2 level.
Conus and cauda equina appear normal.

Paraspinal and other soft tissues: No acute finding.

Disc levels:

T12-L1: Axial images are included in the thoracic spine study. No
significant disc bulge. No spinal canal stenosis or neural foraminal
narrowing

L1-L2: No significant disc bulge. No spinal canal stenosis or neural
foraminal narrowing.

L2-L3: No significant disc bulge. Possible small left foraminal
protrusion. Mild facet arthropathy. No spinal canal stenosis or
neural foraminal narrowing.

L3-L4: No significant disc bulge. Mild facet arthropathy. No spinal
canal stenosis or neural foraminal narrowing.

L4-L5: Disc desiccation and mild disc bulge with central annular
fissure. Mild facet arthropathy. Mild-to-moderate spinal canal
stenosis, in part secondary to mild epidural lipomatosis, as well as
short pedicles, which narrow the AP diameter of the spinal canal.
Narrowing of the lateral recesses. No neural foraminal narrowing.

L5-S1: Disc desiccation and minimal disc bulge.
Right-greater-than-left mild facet arthropathy. Epidural lipomatosis
at this level causes moderate thecal sac narrowing. No osseous
stenosis. Mild bilateral neural foraminal narrowing.
IMPRESSION: 1. Evaluation is somewhat limited by motion artifact. Within this
limitation, there is L4-L5 mild-to-moderate spinal canal stenosis,
which is secondary to degenerative disc disease, mild facet
arthropathy, mild epidural lipomatosis, and congenitally short
pedicles. Narrowing of the lateral recesses at this level could
affect the descending L5 nerve roots.
2. L5-S1 moderate thecal sac narrowing, secondary to epidural
lipomatosis, with mild bilateral neural foraminal narrowing.

## 2021-05-29 IMAGING — MR MR CERVICAL SPINE W/O CM
4 of 5 series · 19 of 48 positions shown · non-contrast
Comparison: No prior MRI, correlation is made with CT cervical
spine [DATE]

CLINICAL DATA: Burning/paresthesias in arms and legs

EXAM:
MRI CERVICAL SPINE WITHOUT CONTRAST
TECHNIQUE: Multiplanar, multisequence MR imaging of the cervical spine was
performed. No intravenous contrast was administered.

[Series 2: T2 · sagittal · 3.0mm · 0.43mm/px · 5 of 17 slices shown (1 of 2)]
[im 1/17]
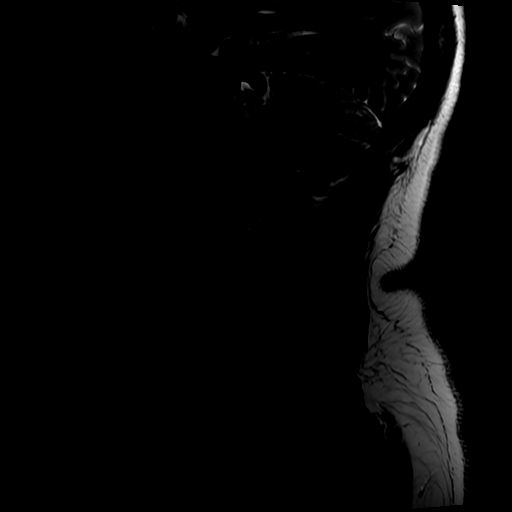
[im 5/17]
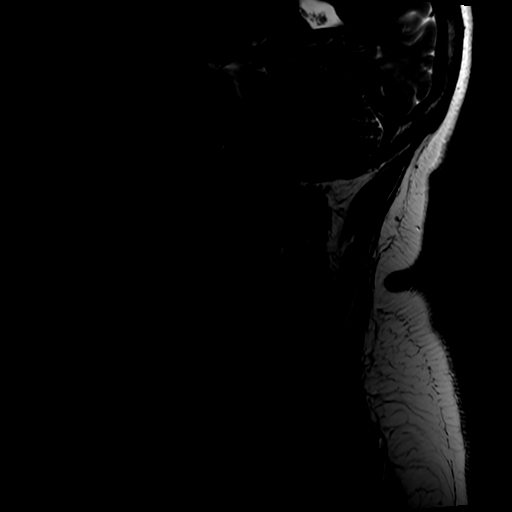
[im 9/17]
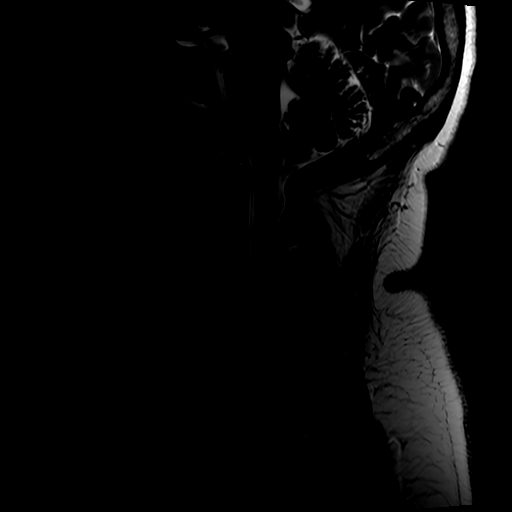
[im 13/17]
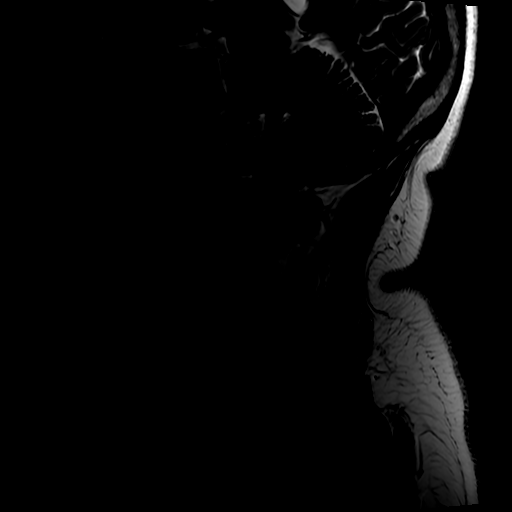
[im 17/17]
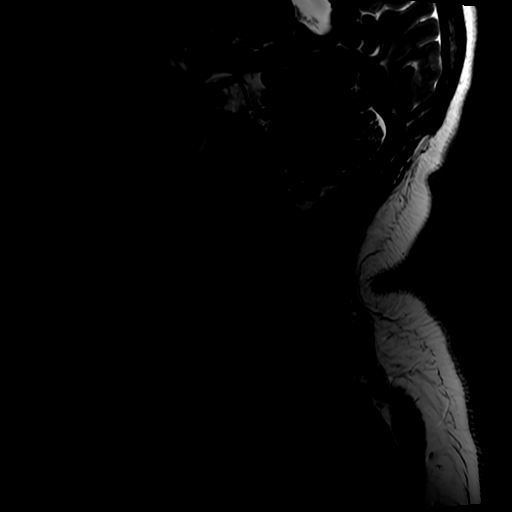

[Series 3: FLAIR · sagittal · 3.0mm · 0.43mm/px · 3 of 17 slices shown]
[im 1/17]
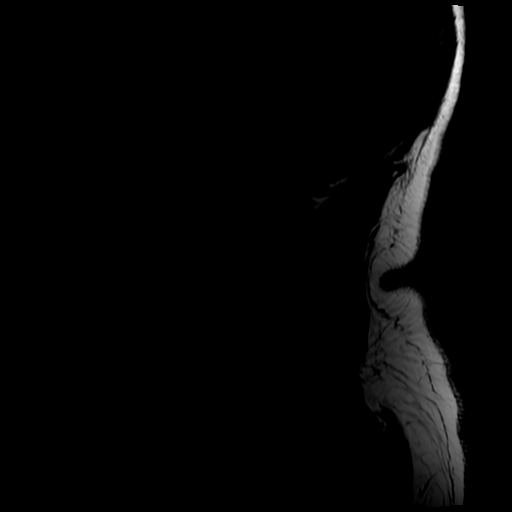
[im 11/17]
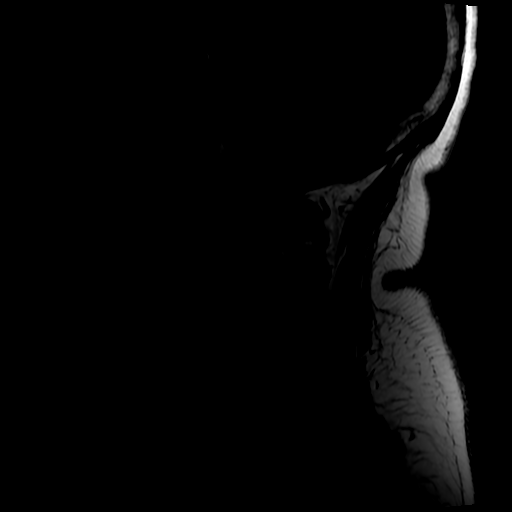
[im 17/17]
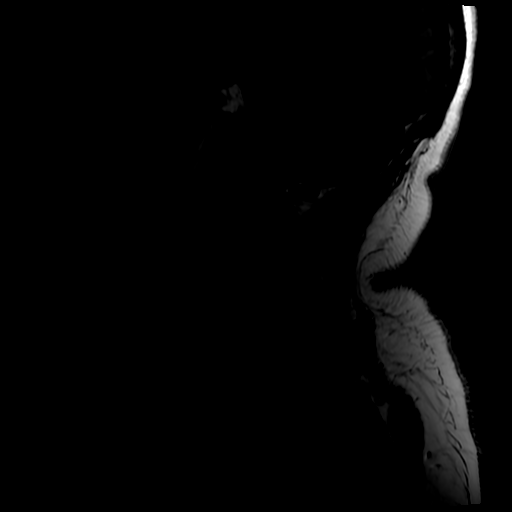

[Series 4: STIR · sagittal · 3.0mm · 0.43mm/px · 3 of 17 slices shown]
[im 1/17]
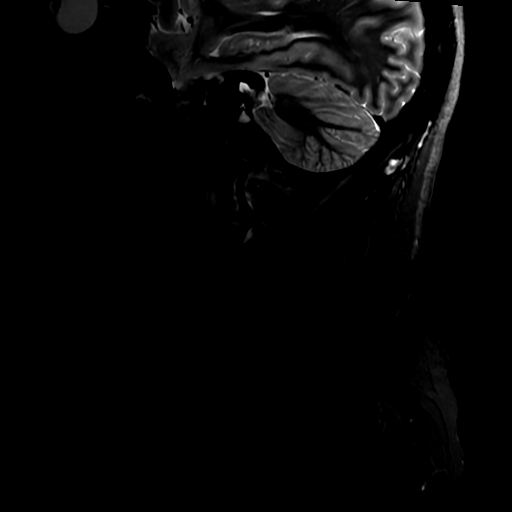
[im 11/17]
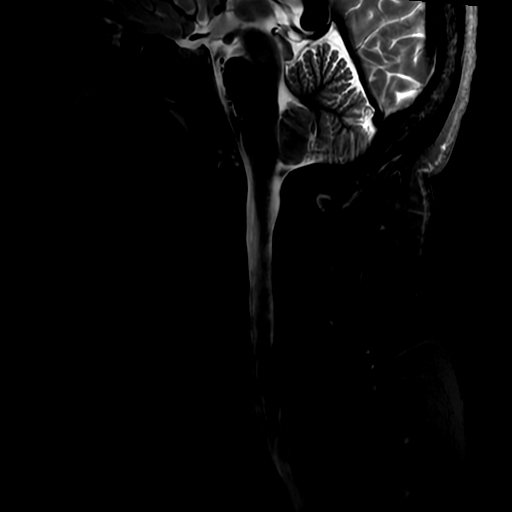
[im 17/17]
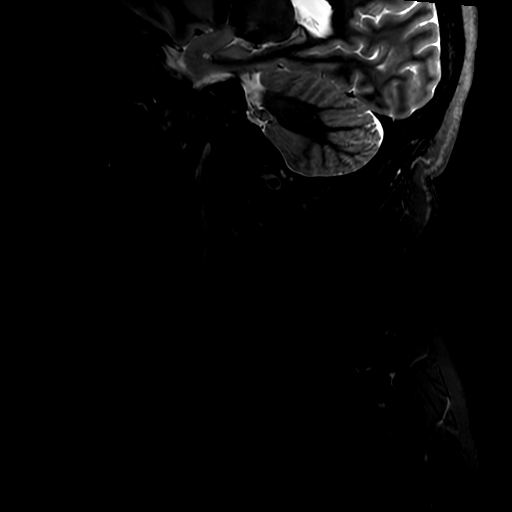

[Series 6: T2 · axial · 3.0mm · 0.35mm/px · z∈[-86,+26]mm · 8 of 38 slices shown (2 of 2)]
[im 1/38]
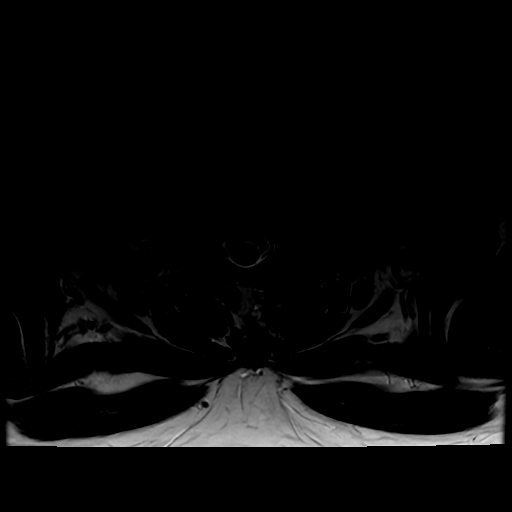
[im 5/38]
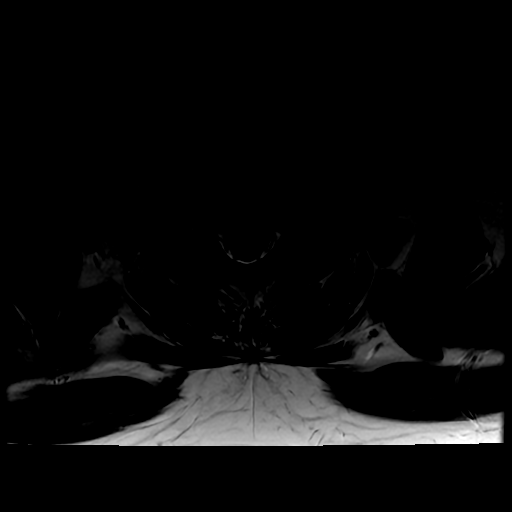
[im 10/38]
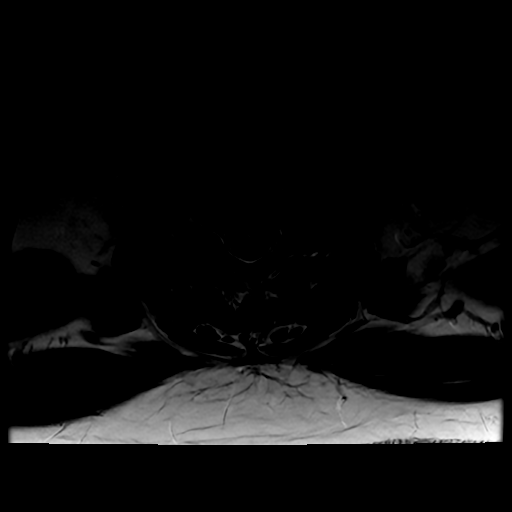
[im 14/38]
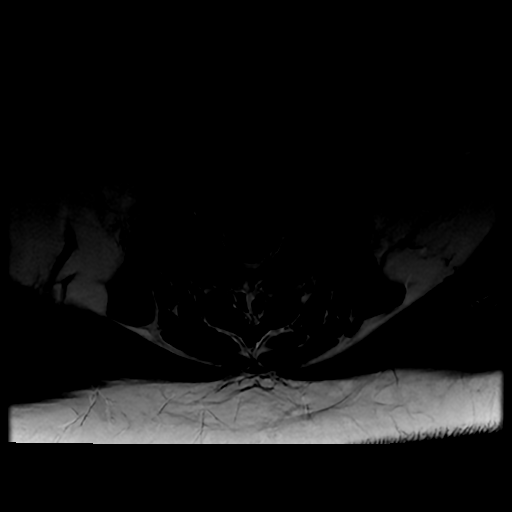
[im 19/38]
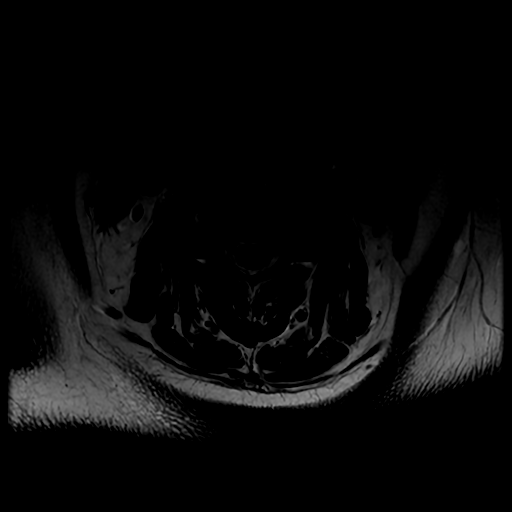
[im 24/38]
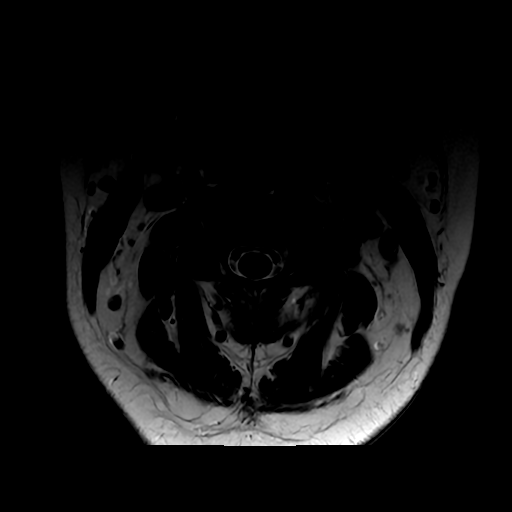
[im 28/38]
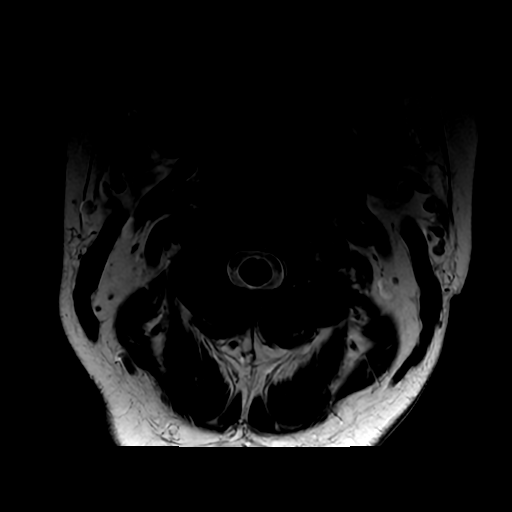
[im 33/38]
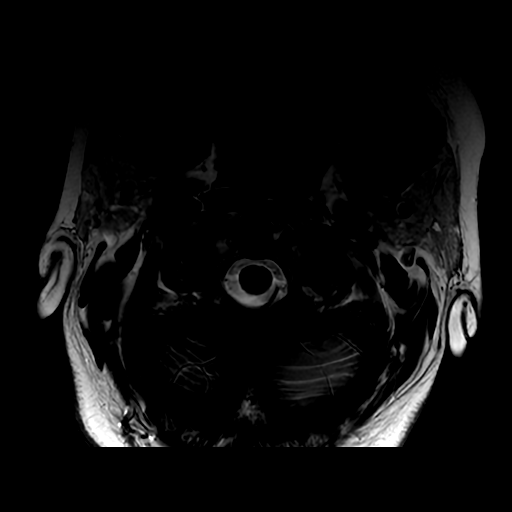

[19 of 48 positions shown; findings below may reference images not displayed]

FINDINGS: Alignment: Mild straightening of the normal cervical lordosis.

Vertebrae: No acute fracture or suspicious osseous lesion.

Cord: Normal in signal and morphology.

Posterior Fossa, vertebral arteries, paraspinal tissues: Negative.

Disc levels:

C2-C3: No significant disc bulge. No spinal canal stenosis or
neuroforaminal narrowing.

C3-C4: No significant disc bulge. No spinal canal stenosis or
neuroforaminal narrowing.

C4-C5: No significant disc bulge. No spinal canal stenosis or
neuroforaminal narrowing.

C5-C6: Minimal disc bulge. No spinal canal stenosis or
neuroforaminal narrowing.

C6-C7: Minimal disc bulge. No spinal canal stenosis or
neuroforaminal narrowing.

C7-T1: No significant disc bulge. No spinal canal stenosis or
neuroforaminal narrowing.
IMPRESSION: No spinal canal stenosis or neural foraminal narrowing.

## 2021-05-29 IMAGING — MR MR THORACIC SPINE W/O CM
4 of 7 series · 19 of 48 positions shown · non-contrast
Comparison: Prior MRI of the thoracic spine

CLINICAL DATA: Burning/paresthesias in arms and legs

EXAM:
MRI THORACIC SPINE WITHOUT CONTRAST
TECHNIQUE: Multiplanar, multisequence MR imaging of the thoracic spine was
performed. No intravenous contrast was administered.

[Series 2: T1 · sagittal · 3.0mm · 0.90mm/px · 3 of 17 slices shown]
[im 1/17]
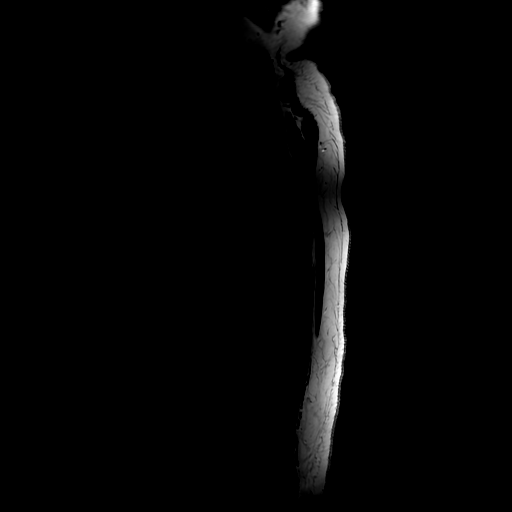
[im 11/17]
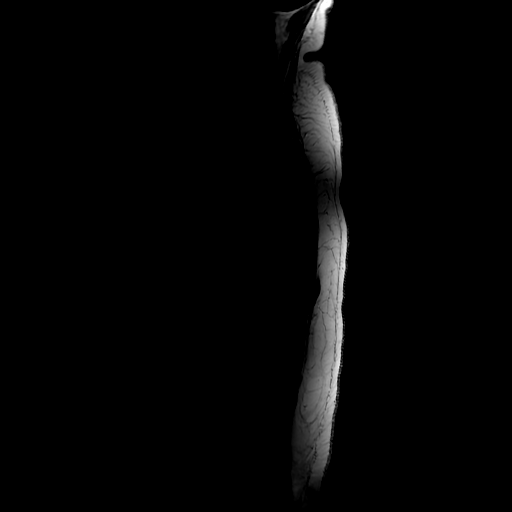
[im 17/17]
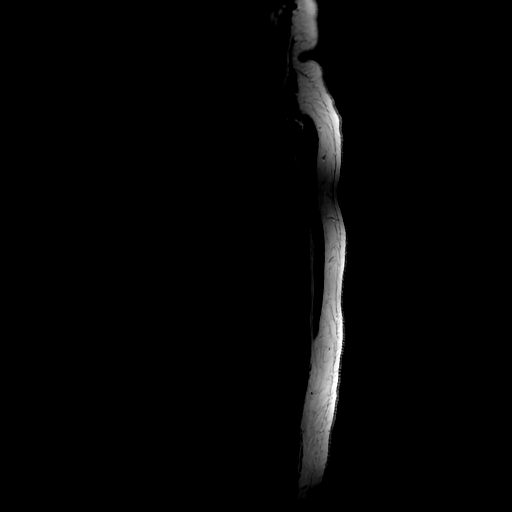

[Series 3: T2 · sagittal · 3.0mm · 0.62mm/px · 4 of 19 slices shown (1 of 3)]
[im 1/19]
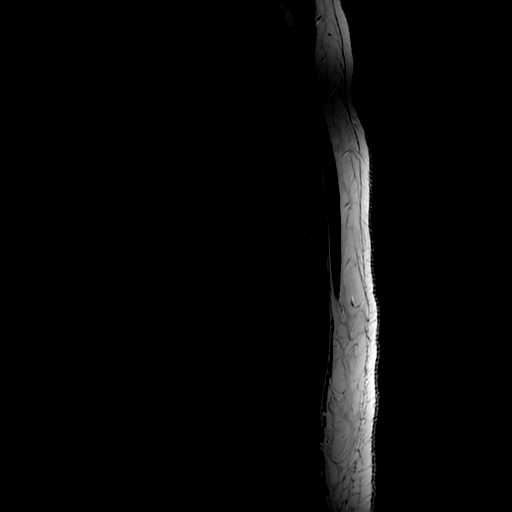
[im 7/19]
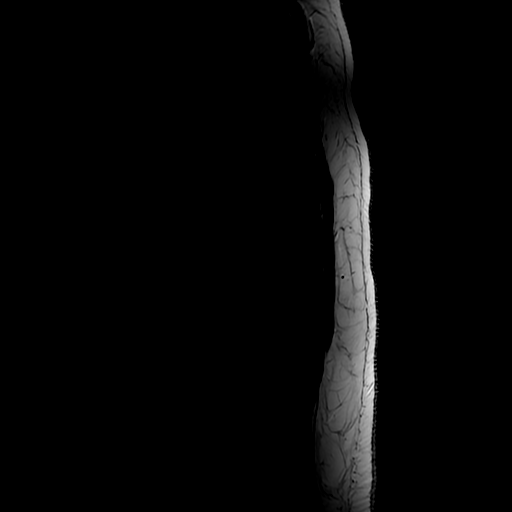
[im 13/19]
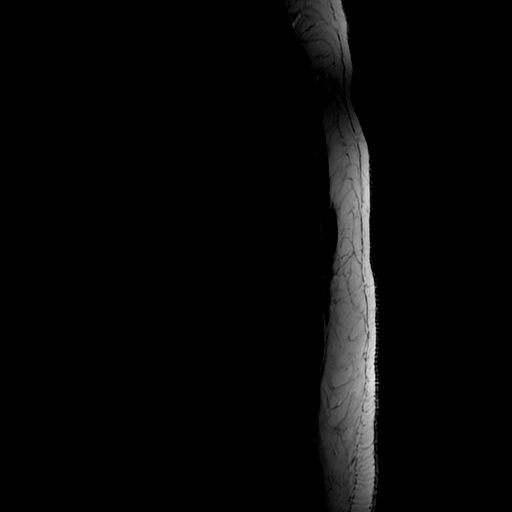
[im 19/19]
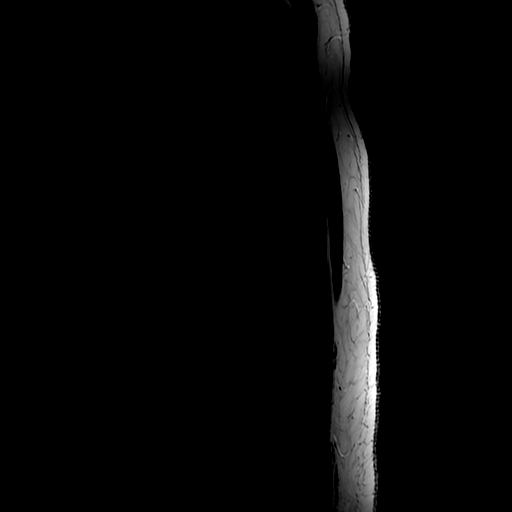

[Series 6: T2 · axial · 4.0mm · 0.39mm/px · z∈[-346,-62]mm · 9 of 58 slices shown (2 of 3)]
[im 1/58]
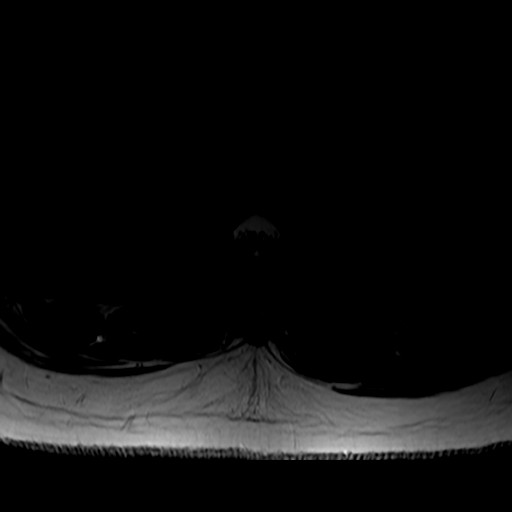
[im 11/58]
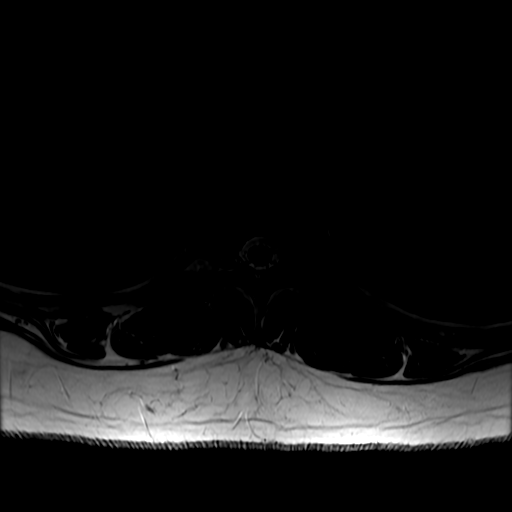
[im 16/58]
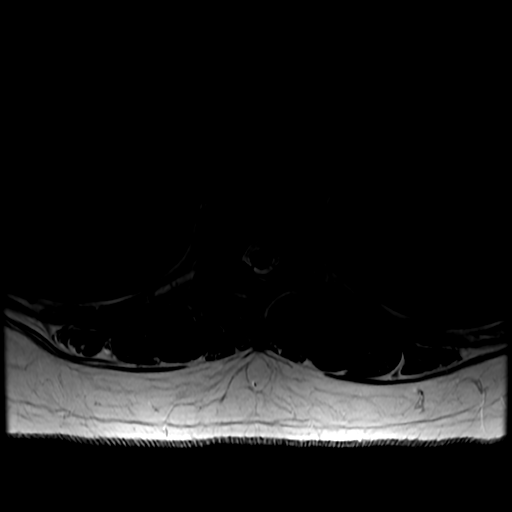
[im 26/58]
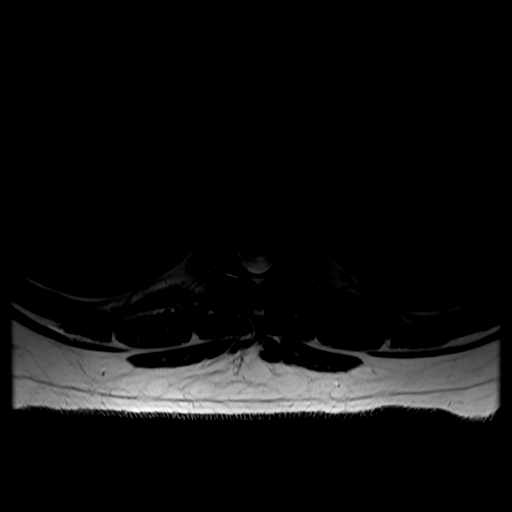
[im 32/58]
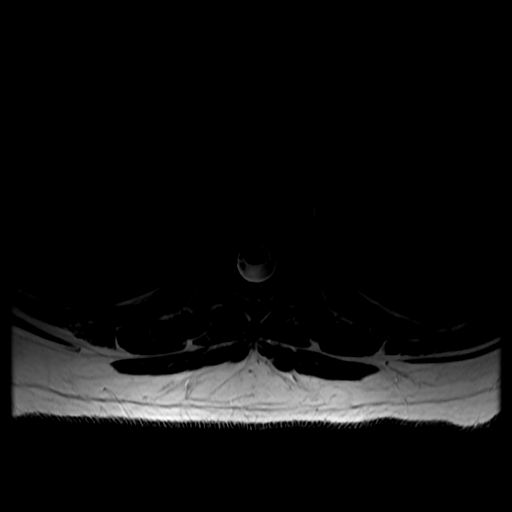
[im 42/58]
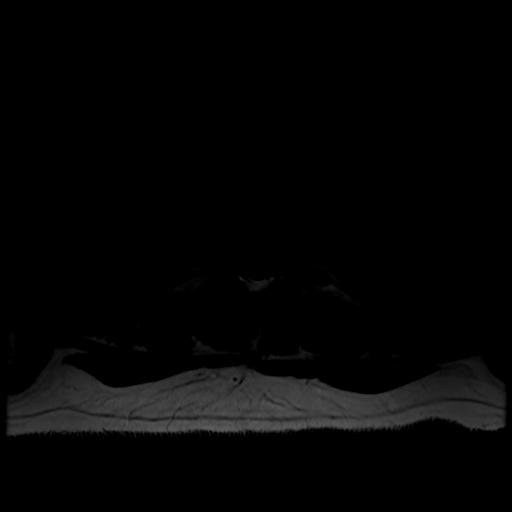
[im 47/58]
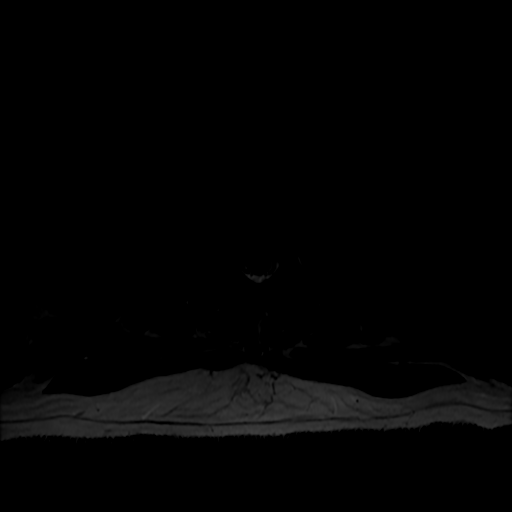
[im 52/58]
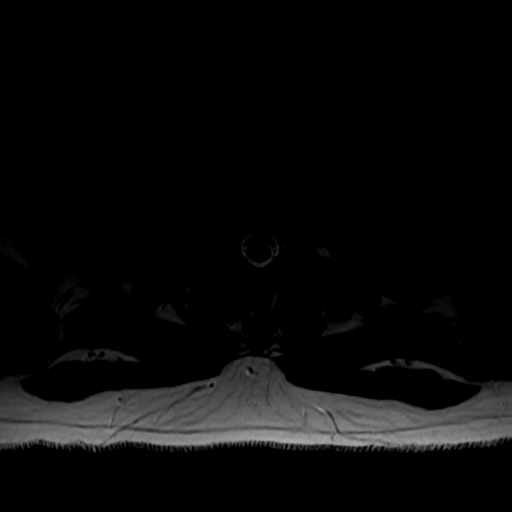
[im 58/58]
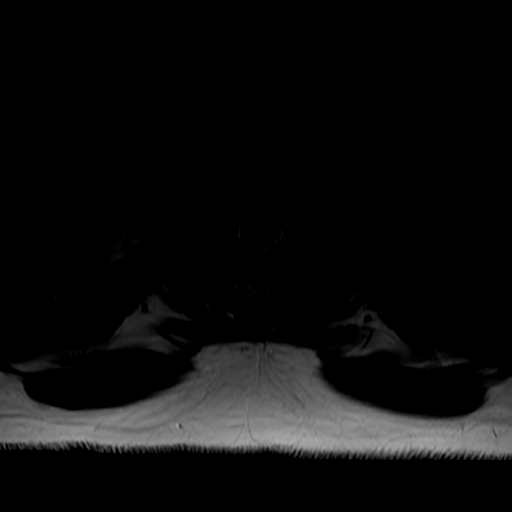

[Series 9: T2 · axial · 4.0mm · 0.39mm/px · z∈[-269,-117]mm · 3 of 37 slices shown (3 of 3)]
[im 6/37]
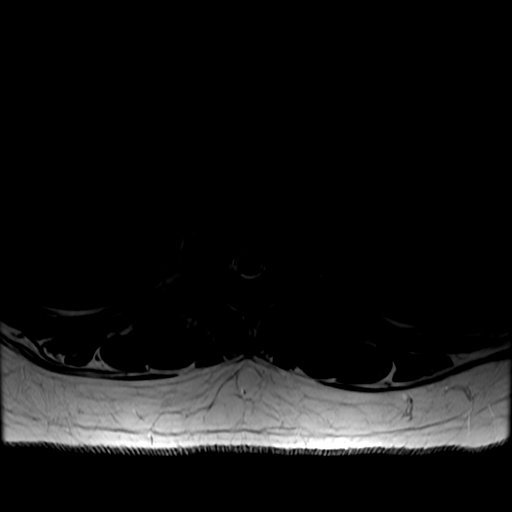
[im 21/37]
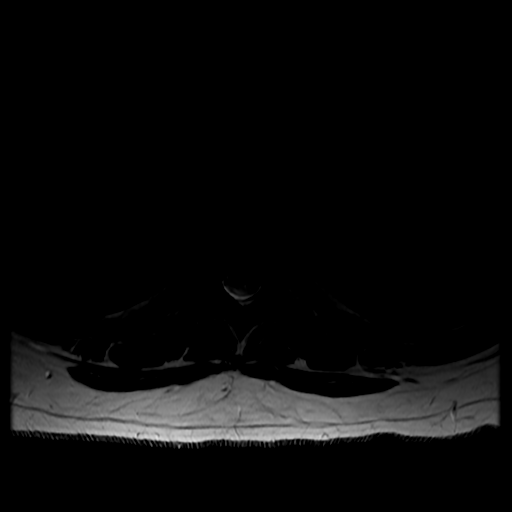
[im 31/37]
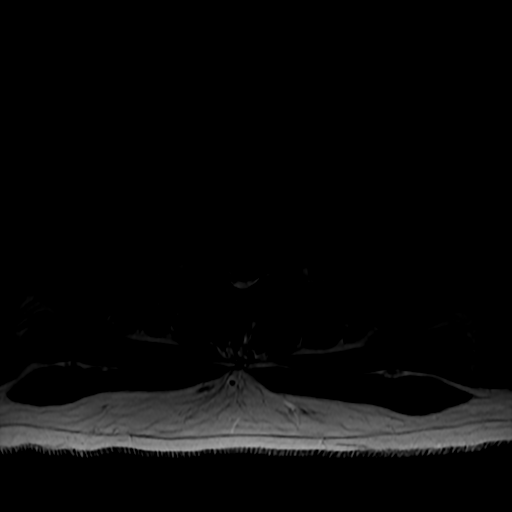

[19 of 48 positions shown; findings below may reference images not displayed]

FINDINGS: Evaluation is somewhat limited by motion artifact.

Alignment: Mild S shaped curvature of the thoracolumbar spine. No
significant listhesis.

Vertebrae: No acute fracture or suspicious osseous lesion. No
abnormal enhancement.

Cord:  Normal in caliber and signal.  No abnormal enhancement.

Paraspinal and other soft tissues: No acute finding.

Disc levels:

Minimal disc bulges at multiple levels, such as T5-T6 and T8-T9,
without significant spinal canal stenosis or neural foraminal
narrowing.
IMPRESSION: Evaluation is somewhat limited by motion artifact. Within this
limitation, no acute finding in the thoracic spine. No spinal canal
stenosis or significant neural foraminal narrowing.

## 2021-05-29 MED ORDER — GABAPENTIN 300 MG PO CAPS
600.0000 mg | ORAL_CAPSULE | Freq: Once | ORAL | Status: AC
  Administered 2021-05-29: 600 mg via ORAL
  Filled 2021-05-29: qty 2

## 2021-05-29 MED ORDER — NAPROXEN 375 MG PO TABS: 375.0000 mg | ORAL_TABLET | Freq: Two times a day (BID) | ORAL | 0 refills | Status: DC

## 2021-05-29 MED ORDER — ACETAMINOPHEN 325 MG PO TABS: 650.0000 mg | ORAL_TABLET | Freq: Once | ORAL | Status: DC

## 2021-05-29 MED ORDER — OXYCODONE-ACETAMINOPHEN 5-325 MG PO TABS
1.0000 | ORAL_TABLET | Freq: Once | ORAL | Status: AC
  Administered 2021-05-29: 1 via ORAL
  Filled 2021-05-29: qty 1

## 2021-05-29 MED ORDER — OXYCODONE-ACETAMINOPHEN 5-325 MG PO TABS
1.0000 | ORAL_TABLET | Freq: Once | ORAL | Status: DC
Start: 2021-05-29 — End: 2021-05-29
  Filled 2021-05-29: qty 1

## 2021-05-29 MED ORDER — GABAPENTIN 600 MG PO TABS: 600.0000 mg | ORAL_TABLET | Freq: Three times a day (TID) | ORAL | 1 refills | Status: AC

## 2021-05-29 MED ORDER — KETOROLAC TROMETHAMINE 30 MG/ML IJ SOLN
30.0000 mg | Freq: Once | INTRAMUSCULAR | Status: AC
  Administered 2021-05-29: 30 mg via INTRAMUSCULAR
  Filled 2021-05-29: qty 1

## 2021-05-29 MED ORDER — METHYLPREDNISOLONE 4 MG PO TBPK: ORAL_TABLET | ORAL | 0 refills | Status: DC

## 2021-05-29 MED ORDER — METHYLPREDNISOLONE 4 MG PO TBPK: ORAL_TABLET | ORAL | 0 refills | Status: AC

## 2021-05-29 MED ORDER — NAPROXEN 375 MG PO TABS: 375.0000 mg | ORAL_TABLET | Freq: Two times a day (BID) | ORAL | 0 refills | Status: AC

## 2021-05-29 MED ORDER — GABAPENTIN 600 MG PO TABS: 600.0000 mg | ORAL_TABLET | Freq: Three times a day (TID) | ORAL | 1 refills | Status: DC

## 2021-05-29 NOTE — ED Triage Notes (Signed)
Pt arrives to ED with c/o lower back pain and bilateral leg pain. This started on 5/18 but worsened yesterday. The pain is described as constant and burning. The pain in her right leg tends to be worse than the left. Pt denies incontinence.

## 2021-05-29 NOTE — ED Provider Notes (Signed)
  Physical Exam  BP 134/85   Pulse 64   Temp 98 F (36.7 C)   Resp 17   Ht 5\' 6"  (1.676 m)   Wt 122.9 kg   SpO2 97%   BMI 43.74 kg/m   Physical Exam Vitals and nursing note reviewed.  Constitutional:      Appearance: Normal appearance.  HENT:     Head: Normocephalic and atraumatic.  Eyes:     Conjunctiva/sclera: Conjunctivae normal.  Pulmonary:     Effort: Pulmonary effort is normal. No respiratory distress.  Skin:    General: Skin is warm and dry.  Neurological:     Mental Status: She is alert.  Psychiatric:        Mood and Affect: Mood normal.        Behavior: Behavior normal.   Procedures  Procedures  ED Course / MDM   Clinical Course as of 05/29/21 1904  Mon May 29, 2021  1901 Pending MRI [MK]    Clinical Course User Index [MK] Kommor, Debe Coder, MD   Medical Decision Making Amount and/or Complexity of Data Reviewed Labs: ordered. Radiology: ordered.  Risk Prescription drug management.  Patient seen at [facility] ED for burning sensation to bilateral lower extremities for 3-4 days. Pt transferred to [facility] ED for MRI of cervical, thoracic, and lumbar spine.  Patient discussed and care transferred to attending physician Dr. Matilde Sprang at shift change. Please see his/her note for further details regarding further ED course and disposition. Plan at time of handoff is await MRI. If imaging is normal, plan to discharge to home.       Kateri Plummer, PA-C 05/29/21 1905    Lorelle Gibbs, DO 05/30/21 1643

## 2021-05-29 NOTE — ED Provider Notes (Signed)
[facility] EMERGENCY DEPT Provider Note   CSN: [id number] Arrival date & time: 05/29/21  1006     History  Chief Complaint  Patient presents with   Back Pain   Leg Pain    [name] is a 36 y.o. female who presents to the ED today with  multiple complaints.   Pt reports about 1 week ago she began experiencing RLQ abdominal pain that she attributed to her ovary. She reports this resolved after about 2 days however shortly afterwards began experiencing a burning pain sensation in her BLEs. She reports that yesterday she began having pain in her lower back and then noticed the same burning sensation in her arms. She has been taking OTC meds without relief. She describes a "pins and needles" sensation in her bilateral feet yesterday however states the burning sensation feels different. No rash. Denies fevers, chills, headache, neck stiffness, sore throat, ear pain, cough, SOB, chest pain, current abdominal pain, or any other associated symptoms.   The history is provided by the patient and medical records.      Home Medications Prior to Admission medications   Medication Sig Start Date End Date Taking? Authorizing Provider  acetaminophen (TYLENOL) 325 MG tablet Take 1-2 tablets (325-650 mg total) by mouth every 4 (four) hours as needed for mild pain. 11/05/17   Love, Evlyn Kanner, PA-C  clonazePAM (KLONOPIN) 0.5 MG tablet Take 0.5 tablets (0.25 mg total) by mouth 2 (two) times daily as needed (anxiety). 11/05/17   Love, Evlyn Kanner, PA-C  diclofenac sodium (VOLTAREN) 1 % GEL Apply 4 g topically 4 (four) times daily. 11/05/17   Love, Evlyn Kanner, PA-C  docusate sodium (COLACE) 100 MG capsule Take 1 capsule (100 mg total) by mouth 2 (two) times daily. 11/05/17   Love, Evlyn Kanner, PA-C  gabapentin (NEURONTIN) 600 MG tablet Take 1 tablet (600 mg total) by mouth 3 (three) times daily. 11/14/17   Marcello Fennel, MD  levofloxacin (LEVAQUIN) 500 MG tablet Take 500 mg by mouth  daily.    [provider]  methocarbamol (ROBAXIN) 500 MG tablet Take 1 tablet (500 mg total) by mouth every 6 (six) hours as needed for muscle spasms. 01/15/18   Marcello Fennel, MD  nicotine (NICODERM CQ - DOSED IN MG/24 HR) 7 mg/24hr patch Place 1 patch (7 mg total) onto the skin daily. 11/05/17   Love, Evlyn Kanner, PA-C  nortriptyline (PAMELOR) 25 MG capsule TAKE 1 CAPSULE (25 MG TOTAL) BY MOUTH AT BEDTIME. 04/16/18   Marcello Fennel, MD      Allergies    Aspirin    Review of Systems   Review of Systems  Constitutional:  Negative for chills and fever.  HENT:  Negative for congestion and sore throat.   Eyes:  Negative for visual disturbance.  Respiratory:  Negative for cough and shortness of breath.   Cardiovascular:  Negative for chest pain.  Gastrointestinal:  Positive for abdominal pain (resolved). Negative for diarrhea, nausea and vomiting.  Musculoskeletal:  Positive for back pain.  Neurological:  Negative for seizures, syncope, weakness, numbness and headaches.       + paresthesias   Physical Exam Updated Vital Signs BP 134/85   Pulse 64   Temp 98 F (36.7 C)   Resp 17   Ht  (1.676 m)   Wt 122.9 kg   SpO2 97%   BMI 43.74 kg/m  Physical Exam Vitals and nursing note reviewed.  Constitutional:  Appearance: She is not ill-appearing or diaphoretic.     Comments: Antalgic gait  HENT:     Head: Normocephalic and atraumatic.  Eyes:     Conjunctiva/sclera: Conjunctivae normal.  Cardiovascular:     Rate and Rhythm: Normal rate and regular rhythm.     Pulses: Normal pulses.  Pulmonary:     Effort: Pulmonary effort is normal.     Breath sounds: Normal breath sounds. No wheezing, rhonchi or rales.  Abdominal:     Palpations: Abdomen is soft.     Tenderness: There is no abdominal tenderness. There is no guarding or rebound.  Musculoskeletal:     Cervical back: Neck supple.  Skin:    General: Skin is warm and dry.  Neurological:     Mental Status: She  is alert.     Comments: Alert and oriented to self, place, time and event.   Speech is fluent, clear without dysarthria or dysphasia.   Strength 5/5 in upper/lower extremities   Sensation intact in upper/lower extremities however pt describes burning sensation throughout.   No pronator drift.  Normal finger-to-nose and feet tapping.  CN I not tested  CN II grossly intact visual fields bilaterally. Did not visualize posterior eye.  CN III, IV, VI PERRLA and EOMs intact bilaterally  CN V Intact sensation to sharp and light touch to the face  CN VII facial movements symmetric  CN VIII not tested  CN IX, X no uvula deviation, symmetric rise of soft palate  CN XI 5/5 SCM and trapezius strength bilaterally  CN XII Midline tongue protrusion, symmetric L/R movements     ED Results / Procedures / Treatments   Labs (all labs ordered are listed, but only abnormal results are displayed) Labs Reviewed  URINALYSIS, ROUTINE W REFLEX MICROSCOPIC - Abnormal; Notable for the following components:      Result Value   Hgb urine dipstick TRACE (*)    Leukocytes,Ua LARGE (*)    Bacteria, UA RARE (*)    All other components within normal limits  COMPREHENSIVE METABOLIC PANEL - Abnormal; Notable for the following components:   Glucose, Bld 101 (*)    All other components within normal limits  CBC WITH DIFFERENTIAL/PLATELET  PREGNANCY, URINE  CK    EKG None  Radiology CT Cervical Spine Wo Contrast  Result Date: 05/29/2021 CLINICAL DATA:  burning sensation in arms and legs. EXAM: CT CERVICAL SPINE WITHOUT CONTRAST TECHNIQUE: Multidetector CT imaging of the cervical spine was performed without intravenous contrast. Multiplanar CT image reconstructions were also generated. RADIATION DOSE REDUCTION: This exam was performed according to the departmental dose-optimization program which includes automated exposure control, adjustment of the mA and/or kV according to patient size and/or use of iterative  reconstruction technique. COMPARISON:  CT of the cervical spine from October 21, 2017. FINDINGS: Alignment: Reversal of the normal cervical lordosis. No substantial sagittal subluxation. Skull base and vertebrae: Vertebral body heights are maintained. No evidence of acute fracture. Soft tissues and spinal canal: No prevertebral fluid or swelling. No visible canal hematoma. Disc levels: Mild multilevel bony degenerative change. No evidence of significant bony canal or foraminal stenosis. Upper chest: Visualized lung apices are clear. IMPRESSION: 1. No evidence of acute fracture or traumatic malalignment. 2. No evidence of significant bony canal or foraminal stenosis. Electronically Signed   By: Feliberto HartsFrederick S Jones M.D.   On: 05/29/2021 13:03   CT Lumbar Spine Wo Contrast  Result Date: 05/29/2021 CLINICAL DATA:  burning sensation in arms and legs with  lower back pain EXAM: CT LUMBAR SPINE WITHOUT CONTRAST TECHNIQUE: Multidetector CT imaging of the lumbar spine was performed without intravenous contrast administration. Multiplanar CT image reconstructions were also generated. RADIATION DOSE REDUCTION: This exam was performed according to the departmental dose-optimization program which includes automated exposure control, adjustment of the mA and/or kV according to patient size and/or use of iterative reconstruction technique. COMPARISON:  None Available. FINDINGS: Segmentation: 5 non rib-bearing lumbar vertebral bodies. Alignment: No substantial sagittal subluxation. Vertebrae: No evidence of acute fracture. Degenerative Schmorl's nodes involving the superior T12, L1 and L2 endplates. Paraspinal and other soft tissues: Cholecystectomy clips. Disc levels: At L4-L5, moderately sized inferiorly directed disc protrusion which narrows the subarticular recesses with at least mild to moderate canal stenosis. Mild disc height loss at this level. Small central disc protrusion at L5-S1. Otherwise, no evidence of significant  bony canal or foraminal stenosis. IMPRESSION: 1. At L4-L5, moderately sized inferiorly directed disc protrusion which narrows the subarticular recesses with at least mild to moderate canal stenosis. An MRI could better evaluate the canal and foramina if clinically warranted. 2. No evidence of acute fracture or malalignment. Electronically Signed   By: Feliberto Harts M.D.   On: 05/29/2021 12:57    Procedures Procedures    Medications Ordered in ED Medications  oxyCODONE-acetaminophen (PERCOCET/ROXICET) 5-325 MG per tablet 1 tablet (1 tablet Oral Given 05/29/21 1153)    ED Course/ Medical Decision Making/ A&P                           Medical Decision Making 36 year old female who presents to the ED today with vague complaints of a burning sensation/pain to her bilateral lower extremities for the past 3 to 4 days with new onset same sensation to arms that began last night with associated lower back pain. No history of same. No new meds. Has been on gabapentin in the past s/2 traumatic L foot injury however no longer on it. No new medications. Denies specific neck pain. On arrival to the ED VSS. Personally visualized patient ambulating from triage with antalgic gait s/2 leg pain/lower back pain. On exam she is noted to have intact sensation throughout and strength 5/5 however again describes burning pain. Remainder of neuro exam unremarkable. Discussed case with attending physician Dr. Audley Hose - recommends CT C spine and L spine without contrast at this time as we do not have MRI capability here. Will also plan on labs. May consider outpatient neurology follow up if Cts are unequivocal and likely start patient back on gabapentin.   CBC without leukocytosis. Hgb stable without signs of anemia CMP without electrolyte abnormalities.  CK WNL at 82  CT C spine clear CT L spine: IMPRESSION:  1. At L4-L5, moderately sized inferiorly directed disc protrusion  which narrows the subarticular recesses with  at least mild to  moderate canal stenosis. An MRI could better evaluate the canal and  foramina if clinically warranted.  2. No evidence of acute fracture or malalignment.   Discussed case with attending physician Dr. Audley Hose who recommends MRI C, T, and L spine without contrast to evaluate for any lesions or other abnormalities. Pt to be transferred for MRI. She will go via personal vehicle with family member.   Dr. Wilkie Aye as [facility] accepting.   Amount and/or Complexity of Data Reviewed Labs: ordered. Decision-making details documented in ED Course. Radiology: ordered.  Risk Prescription drug management.  Final Clinical Impression(s) / ED Diagnoses Final diagnoses:  Paresthesias    Rx / DC Orders ED Discharge Orders     None         Tanda Rockers, PA-C 05/29/21 1408    Cheryll Cockayne, MD 06/02/21 [phone]

## 2021-05-29 NOTE — ED Notes (Signed)
Patient transported to MRI 

## 2021-05-29 NOTE — ED Notes (Signed)
Discharge instructions reviewed with patient. Patient verbalized understanding of instructions. Follow-up care and medications were reviewed. Patient ambulatory with steady gait. VSS upon discharge.  ?

## 2021-06-14 DIAGNOSIS — F331 Major depressive disorder, recurrent, moderate: Secondary | ICD-10-CM | POA: Diagnosis not present

## 2021-07-04 DIAGNOSIS — M5440 Lumbago with sciatica, unspecified side: Secondary | ICD-10-CM | POA: Diagnosis not present

## 2021-07-07 DIAGNOSIS — F331 Major depressive disorder, recurrent, moderate: Secondary | ICD-10-CM | POA: Diagnosis not present

## 2021-07-24 DIAGNOSIS — F331 Major depressive disorder, recurrent, moderate: Secondary | ICD-10-CM | POA: Diagnosis not present

## 2021-07-26 DIAGNOSIS — R030 Elevated blood-pressure reading, without diagnosis of hypertension: Secondary | ICD-10-CM | POA: Diagnosis not present

## 2021-07-26 DIAGNOSIS — F419 Anxiety disorder, unspecified: Secondary | ICD-10-CM | POA: Diagnosis not present

## 2021-07-26 DIAGNOSIS — F329 Major depressive disorder, single episode, unspecified: Secondary | ICD-10-CM | POA: Diagnosis not present

## 2021-07-27 DIAGNOSIS — F331 Major depressive disorder, recurrent, moderate: Secondary | ICD-10-CM | POA: Diagnosis not present

## 2021-08-01 NOTE — ED Notes (Signed)
Left VM regarding belongings left behind

## 2021-08-23 DIAGNOSIS — F419 Anxiety disorder, unspecified: Secondary | ICD-10-CM | POA: Diagnosis not present

## 2021-08-23 DIAGNOSIS — F329 Major depressive disorder, single episode, unspecified: Secondary | ICD-10-CM | POA: Diagnosis not present

## 2021-08-30 DIAGNOSIS — F3132 Bipolar disorder, current episode depressed, moderate: Secondary | ICD-10-CM | POA: Diagnosis not present

## 2021-09-21 DIAGNOSIS — Z7689 Persons encountering health services in other specified circumstances: Secondary | ICD-10-CM | POA: Diagnosis not present

## 2021-09-21 DIAGNOSIS — F3132 Bipolar disorder, current episode depressed, moderate: Secondary | ICD-10-CM | POA: Diagnosis not present

## 2021-09-25 DIAGNOSIS — Z7689 Persons encountering health services in other specified circumstances: Secondary | ICD-10-CM | POA: Diagnosis not present

## 2021-09-26 DIAGNOSIS — Z7689 Persons encountering health services in other specified circumstances: Secondary | ICD-10-CM | POA: Diagnosis not present

## 2021-09-28 DIAGNOSIS — Z7689 Persons encountering health services in other specified circumstances: Secondary | ICD-10-CM | POA: Diagnosis not present

## 2021-10-06 DIAGNOSIS — F3132 Bipolar disorder, current episode depressed, moderate: Secondary | ICD-10-CM | POA: Diagnosis not present

## 2021-10-12 DIAGNOSIS — Z720 Tobacco use: Secondary | ICD-10-CM | POA: Diagnosis not present

## 2021-10-12 DIAGNOSIS — R42 Dizziness and giddiness: Secondary | ICD-10-CM | POA: Diagnosis not present

## 2021-10-12 DIAGNOSIS — R110 Nausea: Secondary | ICD-10-CM | POA: Diagnosis not present

## 2021-10-12 DIAGNOSIS — Z1152 Encounter for screening for COVID-19: Secondary | ICD-10-CM | POA: Diagnosis not present

## 2021-10-12 DIAGNOSIS — R079 Chest pain, unspecified: Secondary | ICD-10-CM | POA: Diagnosis not present

## 2021-10-12 DIAGNOSIS — F419 Anxiety disorder, unspecified: Secondary | ICD-10-CM | POA: Diagnosis not present

## 2021-10-13 ENCOUNTER — Emergency Department (HOSPITAL_COMMUNITY): Payer: Medicaid Other

## 2021-10-13 ENCOUNTER — Other Ambulatory Visit: Payer: Self-pay

## 2021-10-13 ENCOUNTER — Encounter (HOSPITAL_COMMUNITY): Payer: Self-pay | Admitting: *Deleted

## 2021-10-13 ENCOUNTER — Emergency Department (HOSPITAL_COMMUNITY)
Admission: EM | Admit: 2021-10-13 | Discharge: 2021-10-13 | Disposition: A | Discharge status: Home / Self Care | Payer: Medicaid Other | Attending: Emergency Medicine | Admitting: Emergency Medicine

## 2021-10-13 DIAGNOSIS — F419 Anxiety disorder, unspecified: Secondary | ICD-10-CM | POA: Diagnosis not present

## 2021-10-13 DIAGNOSIS — R0789 Other chest pain: Secondary | ICD-10-CM | POA: Diagnosis not present

## 2021-10-13 DIAGNOSIS — R10819 Abdominal tenderness, unspecified site: Secondary | ICD-10-CM | POA: Diagnosis present

## 2021-10-13 DIAGNOSIS — K529 Noninfective gastroenteritis and colitis, unspecified: Secondary | ICD-10-CM | POA: Diagnosis not present

## 2021-10-13 DIAGNOSIS — R079 Chest pain, unspecified: Secondary | ICD-10-CM | POA: Insufficient documentation

## 2021-10-13 DIAGNOSIS — Z20822 Contact with and (suspected) exposure to covid-19: Secondary | ICD-10-CM | POA: Insufficient documentation

## 2021-10-13 DIAGNOSIS — Z9049 Acquired absence of other specified parts of digestive tract: Secondary | ICD-10-CM | POA: Insufficient documentation

## 2021-10-13 DIAGNOSIS — R42 Dizziness and giddiness: Secondary | ICD-10-CM | POA: Diagnosis not present

## 2021-10-13 DIAGNOSIS — Z1152 Encounter for screening for COVID-19: Secondary | ICD-10-CM | POA: Diagnosis not present

## 2021-10-13 DIAGNOSIS — R110 Nausea: Secondary | ICD-10-CM | POA: Diagnosis not present

## 2021-10-13 LAB — CBC
HCT: 39.6 % (ref 36.0–46.0)
Hemoglobin: 13.7 g/dL (ref 12.0–15.0)
MCH: 31.7 pg (ref 26.0–34.0)
MCHC: 34.6 g/dL (ref 30.0–36.0)
MCV: 91.7 fL (ref 80.0–100.0)
Platelets: 300 10*3/uL (ref 150–400)
RBC: 4.32 MIL/uL (ref 3.87–5.11)
RDW: 12.4 % (ref 11.5–15.5)
WBC: 10.4 10*3/uL (ref 4.0–10.5)
nRBC: 0 % (ref 0.0–0.2)

## 2021-10-13 LAB — RESP PANEL BY RT-PCR (FLU A&B, COVID) ARPGX2
Influenza A by PCR: NEGATIVE
Influenza B by PCR: NEGATIVE
SARS Coronavirus 2 by RT PCR: NEGATIVE

## 2021-10-13 LAB — TROPONIN I (HIGH SENSITIVITY): Troponin I (High Sensitivity): <2 ng/L (ref <18)

## 2021-10-13 LAB — BASIC METABOLIC PANEL
Anion gap: 9 (ref 5–15)
BUN: 7 mg/dL (ref 6–20)
CO2: 26 mmol/L (ref 22–32)
Calcium: 9.4 mg/dL (ref 8.9–10.3)
Chloride: 104 mmol/L (ref 98–111)
Creatinine, Ser: 0.55 mg/dL (ref 0.44–1.00)
GFR, Estimated: >60 mL/min (ref >60)
Glucose, Bld: 107 mg/dL — ABNORMAL HIGH (ref 70–99)
Potassium: 3.5 mmol/L (ref 3.5–5.1)
Sodium: 139 mmol/L (ref 135–145)

## 2021-10-13 LAB — LIPASE, BLOOD: Lipase: 29 U/L (ref 11–51)

## 2021-10-13 MED ORDER — ONDANSETRON HCL 4 MG/2ML IJ SOLN
4.0000 mg | Freq: Once | INTRAMUSCULAR | Status: AC
  Administered 2021-10-13: 4 mg via INTRAVENOUS
  Filled 2021-10-13: qty 2

## 2021-10-13 MED ORDER — SODIUM CHLORIDE 0.9 % IV SOLN: INTRAVENOUS | Status: DC

## 2021-10-13 MED ORDER — ONDANSETRON 4 MG PO TBDP: 4.0000 mg | ORAL_TABLET | Freq: Three times a day (TID) | ORAL | 0 refills | Status: AC | PRN

## 2021-10-13 MED ORDER — AMOXICILLIN-POT CLAVULANATE 875-125 MG PO TABS: 1.0000 | ORAL_TABLET | Freq: Two times a day (BID) | ORAL | 0 refills | Status: AC

## 2021-10-13 NOTE — Discharge Instructions (Signed)
Your testing is totally normal, your blood counts your liver test your kidney test and your electrolytes are all normal.  Your vital signs have been normal, I think that you probably have colitis and would like you to read the attached instructions and take the following medication  Augmentin, 1 tablet by mouth twice a day for the next 7 days  You may take Tylenol 500 mg every 6 hours as needed, Zofran every 6 hours as needed for nausea  Thank you for allowing [place] to treat you in the emergency department today.  After reviewing your examination and potential testing that was done it appears that you are safe to go home.  I would like for you to follow-up with your doctor within the next several days, have them obtain your results and follow-up with them to review all of these tests.  If you should develop severe or worsening symptoms return to the emergency department immediately

## 2021-10-13 NOTE — ED Triage Notes (Signed)
Pt with c/o CP yesterday with nausea, c/o swelling to hands.  Abd pain today with bloody stools.  CP has continued and is constant.  +diarrhea, denies emesis.

## 2021-10-13 NOTE — ED Provider Notes (Signed)
[facility] EMERGENCY DEPARTMENT Provider Note   CSN: [id number] Arrival date & time: 10/13/21  1849     History  Chief Complaint  Patient presents with   Chest Pain    [name] is a 36 y.o. female.   Chest Pain Associated symptoms: nausea   Associated symptoms: no fever    This patient is a 36 year old female with a history of anxiety, she does not have any other chronic medical problems and has had prior abdominal surgery including a cholecystectomy and a bilateral tubal ligation.  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  She has no swelling of the legs.  She does endorse having some abdominal bloating and gassiness today with associated nausea and multiple episodes of watery diarrhea most recently which was associated with some bloody diarrhea.  She does not take any blood thinners, she is allergic to anti-inflammatory medication so she does not take any aspirin containing products.  She denies any rectal foreign bodies or penetration, denies history of hemorrhoids, denies history of gastrointestinal bleeding in the past and has had no recent travel, trauma, infections requiring antibiotics or other people around her who has been sick.  She states that she does not have patient contact at her job.  She was seen at the hospital where she works yesterday and had an EKG performed for her chest pain, she was told that everything was okay but she is concerned because she still has the symptoms today.  It is not exertional, it seems to get a little worse with deep breathing and it is not associated with coughing     Home Medications Prior to Admission medications   Medication Sig Start Date End Date Taking? Authorizing  Provider  amoxicillin-clavulanate (AUGMENTIN) 875-125 MG tablet Take 1 tablet by mouth every 12 (twelve) hours. 10/13/21  Yes Eber Hong, MD  ondansetron (ZOFRAN-ODT) 4 MG disintegrating tablet Take 1 tablet (4 mg total) by mouth every 8 (eight) hours as needed for nausea. 10/13/21  Yes Eber Hong, MD  acetaminophen (TYLENOL) 325 MG tablet Take 1-2 tablets (325-650 mg total) by mouth every 4 (four) hours as needed for mild pain. 11/05/17   Love, Evlyn Kanner, PA-C  clonazePAM (KLONOPIN) 0.5 MG tablet Take 0.5 tablets (0.25 mg total) by mouth 2 (two) times daily as needed (anxiety). 11/05/17   Love, Evlyn Kanner, PA-C  diclofenac sodium (VOLTAREN) 1 % GEL Apply 4 g topically 4 (four) times daily. 11/05/17   Love, Evlyn Kanner, PA-C  docusate sodium (COLACE) 100 MG capsule Take 1 capsule (100 mg total) by mouth 2 (two) times daily. 11/05/17   Love, Evlyn Kanner, PA-C  gabapentin (NEURONTIN) 600 MG tablet Take 1 tablet (600 mg total) by mouth 3 (three) times daily. 05/29/21   Kommor, Madison, MD  levofloxacin (LEVAQUIN) 500 MG tablet Take 500 mg by mouth daily.    [provider]  methocarbamol (ROBAXIN) 500 MG tablet Take 1 tablet (500 mg total) by mouth every 6 (six) hours as needed for muscle spasms. 01/15/18   Marcello Fennel, MD  methylPREDNISolone (MEDROL DOSEPAK) 4 MG TBPK tablet Take as prescribed 05/29/21   Kommor, Wyn Forster, MD  naproxen (NAPROSYN) 375 MG tablet Take 1 tablet (375 mg  total) by mouth 2 (two) times daily. 05/29/21   Kommor, Madison, MD  nicotine (NICODERM CQ - DOSED IN MG/24 HR) 7 mg/24hr patch Place 1 patch (7 mg total) onto the skin daily. 11/05/17   Love, Evlyn Kanner, PA-C  nortriptyline (PAMELOR) 25 MG capsule TAKE 1 CAPSULE (25 MG TOTAL) BY MOUTH AT BEDTIME. 04/16/18   Marcello Fennel, MD      Allergies    Aspirin    Review of Systems   Review of Systems  Constitutional:  Negative for fever.  Cardiovascular:  Positive for chest pain.  Gastrointestinal:  Positive for blood in  stool, diarrhea and nausea.    Physical Exam Updated Vital Signs BP 120/62   Pulse 65   Temp 99.2 F (37.3 C) (Oral)   Resp 17   Ht 1.676 m (5\' 6" )   Wt 126.6 kg   LMP 09/15/2021   SpO2 99%   BMI 45.03 kg/m  Physical Exam Vitals and nursing note reviewed.  Constitutional:      General: She is not in acute distress.    Appearance: She is well-developed.  HENT:     Head: Normocephalic and atraumatic.     Mouth/Throat:     Pharynx: No oropharyngeal exudate.  Eyes:     General: No scleral icterus.       Right eye: No discharge.        Left eye: No discharge.     Conjunctiva/sclera: Conjunctivae normal.     Pupils: Pupils are equal, round, and reactive to light.  Neck:     Thyroid: No thyromegaly.     Vascular: No JVD.  Cardiovascular:     Rate and Rhythm: Normal rate and regular rhythm.     Heart sounds: Normal heart sounds. No murmur heard.    No friction rub. No gallop.  Pulmonary:     Effort: Pulmonary effort is normal. No respiratory distress.     Breath sounds: Normal breath sounds. No wheezing or rales.  Abdominal:     General: Bowel sounds are normal. There is no distension.     Palpations: Abdomen is soft. There is no mass.     Tenderness: There is abdominal tenderness.     Comments: Mild tenderness to the right mid abdomen as well as the left mid abdomen, normal bowel sounds, no peritoneal signs, no guarding, no epigastric discomfort or tenderness  Musculoskeletal:        General: No tenderness. Normal range of motion.     Cervical back: Normal range of motion and neck supple.     Right lower leg: No edema.     Left lower leg: No edema.  Lymphadenopathy:     Cervical: No cervical adenopathy.  Skin:    General: Skin is warm and dry.     Findings: No erythema or rash.  Neurological:     Mental Status: She is alert.     Coordination: Coordination normal.  Psychiatric:        Behavior: Behavior normal.     ED Results / Procedures / Treatments    Labs (all labs ordered are listed, but only abnormal results are displayed) Labs Reviewed  BASIC METABOLIC PANEL - Abnormal; Notable for the following components:      Result Value   Glucose, Bld 107 (*)    All other components within normal limits  RESP PANEL BY RT-PCR (FLU A&B, COVID) ARPGX2  CBC  LIPASE, BLOOD  TROPONIN I (HIGH SENSITIVITY)  TROPONIN I (HIGH  SENSITIVITY)    EKG EKG Interpretation  Date/Time:  Friday October 13 2021 19:03:39 EDT Ventricular Rate:  74 PR Interval:  160 QRS Duration: 102 QT Interval:  402 QTC Calculation: 446 R Axis:   -24 Text Interpretation: Normal sinus rhythm Normal ECG No previous ECGs available Confirmed by Eber Hong ([id number]) on 10/13/2021 7:08:18 PM  Radiology DG Chest 2 View  Result Date: 10/13/2021 CLINICAL DATA:  Chest pain EXAM: CHEST - 2 VIEW COMPARISON:  Chest x-ray 10/21/2017 FINDINGS: The heart size and mediastinal contours are within normal limits. Both lungs are clear. The visualized skeletal structures are unremarkable. IMPRESSION: No active cardiopulmonary disease. Electronically Signed   By: Darliss Cheney M.D.   On: 10/13/2021 20:31    Procedures Procedures    Medications Ordered in ED Medications  0.9 %  sodium chloride infusion ( Intravenous New Bag/Given 10/13/21 1935)  ondansetron [facility]) injection 4 mg (4 mg Intravenous Given 10/13/21 1935)    ED Course/ Medical Decision Making/ A&P                           Medical Decision Making Amount and/or Complexity of Data Reviewed Labs: ordered. Radiology: ordered.  Risk Prescription drug management.   This patient presents to the ED for concern of abdominal pain as well as chest pain and some bloody diarrhea, this involves an extensive number of treatment options, and is a complaint that carries with it a high risk of complications and morbidity.  The differential diagnosis includes this could be related to colitis, this could be COVID-19, this could be some  other gastrointestinal virus.  Would also consider other etiologies such as cardiac or pulmonary causes but she has no pulmonary symptoms and her EKG is totally normal   Co morbidities that complicate the patient evaluation  Obesity   Additional history obtained:  Additional history obtained from electronic medical record External records from outside source obtained and reviewed including prior ER visits, no recent prior admissions to the hospital, office visits including her family doctor who she last saw in the last few years   Lab Tests:  I Ordered, and personally interpreted labs.  The pertinent results include: COVID and influenza negative, metabolic panel and liver function panel totally normal, no leukocytosis, no anemia, negative troponin and lipase.   Imaging Studies ordered:  I ordered imaging studies including chest x-ray I independently visualized and interpreted imaging which showed no acute findings I agree with the radiologist interpretation   Cardiac Monitoring: / EKG:  The patient was maintained on a cardiac monitor.  I personally viewed and interpreted the cardiac monitored which showed an underlying rhythm of: Normal sinus rhythm, no signs of ischemia or arrhythmia    Problem List / ED Course / Critical interventions / Medication management  Patient has done well, normal vital signs, treated clinically for colitis, no indication for CT scan at this time.  Patient understands indications for return understands the results and the treatment plan and is agreement I ordered medication including IV fluids and Zofran for nausea and dehydration Reevaluation of the patient after these medicines showed that the patient improved I have reviewed the patients home medicines and have made adjustments as needed   Social Determinants of Health:  None   Test / Admission - Considered:  Considered admission but the patient stabilized appears well and looks very well  for discharge, she understands indications for return  Final Clinical Impression(s) / ED Diagnoses Final diagnoses:  Colitis    Rx / DC Orders ED Discharge Orders          Ordered    amoxicillin-clavulanate (AUGMENTIN) 875-125 MG tablet  Every 12 hours        10/13/21 2054    ondansetron (ZOFRAN-ODT) 4 MG disintegrating tablet  Every 8 hours PRN        10/13/21 2054              Noemi Chapel, MD 10/13/21 2055
